# Patient Record
Sex: Male | Born: 1940 | Race: White | Hispanic: No | Smoking: Never smoker
Health system: Southern US, Community
[De-identification: ages and names within clinical notes are randomized; demographics above are authoritative.]

## PROBLEM LIST (undated history)

## (undated) DIAGNOSIS — D72829 Elevated white blood cell count, unspecified: Secondary | ICD-10-CM

## (undated) DIAGNOSIS — I350 Nonrheumatic aortic (valve) stenosis: Secondary | ICD-10-CM

## (undated) DIAGNOSIS — G4700 Insomnia, unspecified: Secondary | ICD-10-CM

## (undated) DIAGNOSIS — Z951 Presence of aortocoronary bypass graft: Secondary | ICD-10-CM

## (undated) DIAGNOSIS — I5020 Unspecified systolic (congestive) heart failure: Secondary | ICD-10-CM

## (undated) DIAGNOSIS — Z952 Presence of prosthetic heart valve: Secondary | ICD-10-CM

## (undated) DIAGNOSIS — I712 Thoracic aortic aneurysm, without rupture, unspecified: Secondary | ICD-10-CM

## (undated) DIAGNOSIS — I779 Disorder of arteries and arterioles, unspecified: Secondary | ICD-10-CM

## (undated) DIAGNOSIS — I2510 Atherosclerotic heart disease of native coronary artery without angina pectoris: Secondary | ICD-10-CM

## (undated) DIAGNOSIS — C801 Malignant (primary) neoplasm, unspecified: Secondary | ICD-10-CM

## (undated) DIAGNOSIS — I255 Ischemic cardiomyopathy: Secondary | ICD-10-CM

## (undated) DIAGNOSIS — C4491 Basal cell carcinoma of skin, unspecified: Secondary | ICD-10-CM

## (undated) DIAGNOSIS — R55 Syncope and collapse: Secondary | ICD-10-CM

## (undated) DIAGNOSIS — I700 Atherosclerosis of aorta: Secondary | ICD-10-CM

## (undated) DIAGNOSIS — M719 Bursopathy, unspecified: Secondary | ICD-10-CM

## (undated) DIAGNOSIS — K635 Polyp of colon: Secondary | ICD-10-CM

## (undated) DIAGNOSIS — E291 Testicular hypofunction: Secondary | ICD-10-CM

## (undated) DIAGNOSIS — E041 Nontoxic single thyroid nodule: Secondary | ICD-10-CM

## (undated) DIAGNOSIS — R0602 Shortness of breath: Secondary | ICD-10-CM

## (undated) DIAGNOSIS — E785 Hyperlipidemia, unspecified: Secondary | ICD-10-CM

## (undated) DIAGNOSIS — J9621 Acute and chronic respiratory failure with hypoxia: Secondary | ICD-10-CM

## (undated) DIAGNOSIS — I10 Essential (primary) hypertension: Secondary | ICD-10-CM

## (undated) DIAGNOSIS — R011 Cardiac murmur, unspecified: Secondary | ICD-10-CM

## (undated) DIAGNOSIS — Z7982 Long term (current) use of aspirin: Secondary | ICD-10-CM

## (undated) DIAGNOSIS — I509 Heart failure, unspecified: Secondary | ICD-10-CM

## (undated) HISTORY — PX: SHOULDER INJECTION: SHX5048

## (undated) HISTORY — PX: THYROIDECTOMY, PARTIAL: SHX18

## (undated) HISTORY — PX: HERNIA REPAIR: SHX51

## (undated) HISTORY — PX: CARDIAC CATHETERIZATION: SHX172

## (undated) HISTORY — PX: OTHER SURGICAL HISTORY: SHX169

## (undated) HISTORY — PX: VASCULAR SURGERY: SHX849

## (undated) HISTORY — PX: BASAL CELL CARCINOMA EXCISION: SHX1214

## (undated) HISTORY — PX: CAROTID ENDARTERECTOMY: SUR193

---

## 1968-11-26 HISTORY — PX: INGUINAL HERNIA REPAIR: SUR1180

## 2007-11-11 ENCOUNTER — Ambulatory Visit: Payer: Self-pay | Admitting: Gastroenterology

## 2009-07-27 ENCOUNTER — Ambulatory Visit: Payer: Self-pay | Admitting: Family Medicine

## 2009-08-03 ENCOUNTER — Ambulatory Visit: Payer: Self-pay | Admitting: Vascular Surgery

## 2009-08-16 ENCOUNTER — Ambulatory Visit: Payer: Self-pay | Admitting: Vascular Surgery

## 2009-08-17 ENCOUNTER — Inpatient Hospital Stay: Payer: Self-pay | Admitting: Vascular Surgery

## 2009-11-26 HISTORY — PX: CAROTID ENDARTERECTOMY: SUR193

## 2010-04-20 ENCOUNTER — Ambulatory Visit: Payer: Self-pay | Admitting: Unknown Physician Specialty

## 2010-08-29 ENCOUNTER — Ambulatory Visit: Payer: Self-pay | Admitting: Unknown Physician Specialty

## 2010-09-19 ENCOUNTER — Ambulatory Visit: Payer: Self-pay | Admitting: Unknown Physician Specialty

## 2011-11-22 ENCOUNTER — Ambulatory Visit: Payer: Self-pay | Admitting: Unknown Physician Specialty

## 2012-04-28 ENCOUNTER — Ambulatory Visit: Payer: Self-pay | Admitting: Unknown Physician Specialty

## 2013-05-13 ENCOUNTER — Ambulatory Visit: Payer: Self-pay | Admitting: Unknown Physician Specialty

## 2013-07-06 ENCOUNTER — Ambulatory Visit: Payer: Self-pay | Admitting: Unknown Physician Specialty

## 2013-07-07 LAB — PATHOLOGY REPORT

## 2013-09-14 ENCOUNTER — Ambulatory Visit: Payer: Self-pay | Admitting: Unknown Physician Specialty

## 2014-03-16 ENCOUNTER — Ambulatory Visit: Payer: Self-pay | Admitting: Unknown Physician Specialty

## 2014-09-29 ENCOUNTER — Emergency Department: Payer: Self-pay | Admitting: Emergency Medicine

## 2014-09-29 LAB — BASIC METABOLIC PANEL
Anion Gap: 8 (ref 7–16)
BUN: 15 mg/dL (ref 7–18)
CALCIUM: 8.6 mg/dL (ref 8.5–10.1)
CHLORIDE: 107 mmol/L (ref 98–107)
CREATININE: 1.23 mg/dL (ref 0.60–1.30)
Co2: 29 mmol/L (ref 21–32)
EGFR (African American): >60
EGFR (Non-African Amer.): >60
GLUCOSE: 94 mg/dL (ref 65–99)
OSMOLALITY: 287 (ref 275–301)
Potassium: 3.8 mmol/L (ref 3.5–5.1)
Sodium: 144 mmol/L (ref 136–145)

## 2014-09-29 LAB — CBC
HCT: 45.7 % (ref 40.0–52.0)
HGB: 15.5 g/dL (ref 13.0–18.0)
MCH: 30.5 pg (ref 26.0–34.0)
MCHC: 33.9 g/dL (ref 32.0–36.0)
MCV: 90 fL (ref 80–100)
PLATELETS: 232 10*3/uL (ref 150–440)
RBC: 5.09 10*6/uL (ref 4.40–5.90)
RDW: 13.2 % (ref 11.5–14.5)
WBC: 9.9 10*3/uL (ref 3.8–10.6)

## 2014-09-29 LAB — HEPATIC FUNCTION PANEL A (ARMC)
ALT: 33 U/L
AST: 17 U/L (ref 15–37)
Albumin: 3.9 g/dL (ref 3.4–5.0)
Alkaline Phosphatase: 71 U/L
BILIRUBIN TOTAL: 0.4 mg/dL (ref 0.2–1.0)
Bilirubin, Direct: <0.1 mg/dL (ref 0.0–0.2)
TOTAL PROTEIN: 7.7 g/dL (ref 6.4–8.2)

## 2014-09-29 LAB — TROPONIN I

## 2014-12-15 ENCOUNTER — Ambulatory Visit: Payer: Self-pay | Admitting: Unknown Physician Specialty

## 2015-01-19 ENCOUNTER — Ambulatory Visit: Payer: Self-pay | Admitting: Ophthalmology

## 2015-01-25 ENCOUNTER — Ambulatory Visit: Payer: Self-pay | Admitting: Ophthalmology

## 2015-02-15 ENCOUNTER — Ambulatory Visit: Payer: Self-pay | Admitting: Ophthalmology

## 2015-03-11 ENCOUNTER — Other Ambulatory Visit: Payer: Self-pay | Admitting: Unknown Physician Specialty

## 2015-03-11 DIAGNOSIS — E041 Nontoxic single thyroid nodule: Secondary | ICD-10-CM

## 2015-03-27 NOTE — Op Note (Signed)
PATIENT NAME:  Andre Murphy, Andre Murphy MR#:  [id number] DATE OF BIRTH:  May 02, 1941  DATE OF PROCEDURE:  02/15/2015  PREOPERATIVE DIAGNOSIS:  Nuclear sclerotic cataract of the left eye.   POSTOPERATIVE DIAGNOSIS:  Nuclear sclerotic cataract of the left eye.   OPERATIVE PROCEDURE:  Cataract extraction by phacoemulsification with implant of intraocular lens to left eye.   SURGEON:  Livingston Diones. Natalina Wieting, MD  ANESTHESIA:  1. Managed anesthesia care.  2. Topical tetracaine drops followed by 2% Xylocaine jelly applied in the preoperative holding area.   COMPLICATIONS:  None.   TECHNIQUE:  Stop and chop.  DESCRIPTION OF PROCEDURE:  The patient was examined and consented in the preoperative holding area where the aforementioned topical anesthesia was applied to the left eye and then brought back to the operating room, where the left eye was prepped and draped in the usual sterile ophthalmic fashion and a lid speculum was placed. A paracentesis was created with the side port blade and the anterior chamber was filled with viscoelastic. A near clear corneal incision was performed with the steel keratome. A continuous curvilinear capsulorrhexis was performed with a cystotome followed by the capsulorrhexis forceps. Hydrodissection and hydrodelineation were carried out with BSS on a blunt cannula. The lens was removed in a stop and chop technique and the remaining cortical material was removed with the irrigation-aspiration handpiece. The capsular bag was inflated with viscoelastic and the Tecnis ZCB00, 19.0-diopter lens, serial number [id number], was placed in the capsular bag without complication. The remaining viscoelastic was removed from the eye with the irrigation-aspiration handpiece. The wounds were hydrated. The anterior chamber was flushed with Miostat and the eye was inflated to physiologic pressure. 0.1 mL of cefuroxime concentration 10 mg/mL was placed in the anterior chamber. The wounds were found to be  water tight. The eye was dressed with Vigamox. The patient was given protective glasses to wear throughout the day and a shield with which to sleep tonight. The patient was also given drops with which to begin a drop regimen today and will follow up with me in one day.    ____________________________ Livingston Diones. Nawal Burling, MD wlp:nb D: 02/15/2015 21:22:51 ET T: 02/16/2015 07:16:47 ET JOB#: [id number]  cc: Analyse Angst L. Aneesah Hernan, MD, <Dictator> Livingston Diones Raejean Swinford MD ELECTRONICALLY SIGNED 02/16/2015 12:04

## 2015-03-27 NOTE — Op Note (Signed)
PATIENT NAME:  Andre Murphy, Andre Murphy MR#:  [id number] DATE OF BIRTH:  1941/05/10  DATE OF PROCEDURE:  01/25/2015  PREOPERATIVE DIAGNOSIS:  Nuclear sclerotic cataract of the right eye.   POSTOPERATIVE DIAGNOSIS:  Nuclear sclerotic cataract of the right eye.   OPERATIVE PROCEDURE:  Cataract extraction by phacoemulsification with implant of intraocular lens to right eye.   SURGEON:  Birder Robson, MD.   ANESTHESIA:  1. Managed anesthesia care.  2. Topical tetracaine drops followed by 2% Xylocaine jelly applied in the preoperative holding area.   COMPLICATIONS:  None.   TECHNIQUE:   Stop and chop.   DESCRIPTION OF PROCEDURE:  The patient was examined and consented in the preoperative holding area where the aforementioned topical anesthesia was applied to the right eye and then brought back to the Operating Room where the right eye was prepped and draped in the usual sterile ophthalmic fashion and a lid speculum was placed. A paracentesis was created with the side port blade and the anterior chamber was filled with viscoelastic. A near clear corneal incision was performed with the steel keratome. A continuous curvilinear capsulorrhexis was performed with a cystotome followed by the capsulorrhexis forceps. Hydrodissection and hydrodelineation were carried out with BSS on a blunt cannula. The lens was removed in a stop and chop technique and the remaining cortical material was removed with the irrigation-aspiration handpiece. The capsular bag was inflated with viscoelastic and the Tecnis 0.17.5-diopter lens, serial number [id number] was placed in the capsular bag without complication. The remaining viscoelastic was removed from the eye with the irrigation-aspiration handpiece. The wounds were hydrated. The anterior chamber was flushed with Miostat and the eye was inflated to physiologic pressure. 0.1 mL of cefuroxime concentration 10 mg/mL was placed in the anterior chamber. The wounds were found to be water  tight. The eye was dressed with Vigamox. The patient was given protective glasses to wear throughout the day and a shield with which to sleep tonight. The patient was also given drops with which to begin a drop regimen today and will follow-up with me in one day.    ____________________________ Livingston Diones. Talisha Erby, MD wlp:mc D: 01/25/2015 21:14:16 ET T: 01/26/2015 08:53:38 ET JOB#: [id number]  cc: Starlyn Droge L. Aziza Stuckert, MD, <Dictator> Livingston Diones Marijayne Rauth MD ELECTRONICALLY SIGNED 01/26/2015 11:26

## 2015-06-07 ENCOUNTER — Ambulatory Visit
Admission: RE | Admit: 2015-06-07 | Discharge: 2015-06-07 | Disposition: A | Discharge status: Home / Self Care | Payer: Medicare Other | Source: Ambulatory Visit | Attending: Unknown Physician Specialty | Admitting: Unknown Physician Specialty

## 2015-06-07 DIAGNOSIS — E041 Nontoxic single thyroid nodule: Secondary | ICD-10-CM | POA: Insufficient documentation

## 2015-06-08 ENCOUNTER — Other Ambulatory Visit: Payer: Self-pay | Admitting: Unknown Physician Specialty

## 2015-06-08 DIAGNOSIS — D440 Neoplasm of uncertain behavior of thyroid gland: Secondary | ICD-10-CM

## 2015-12-09 ENCOUNTER — Ambulatory Visit: Payer: Medicare Other

## 2015-12-16 ENCOUNTER — Ambulatory Visit: Payer: Medicare Other

## 2016-08-17 ENCOUNTER — Encounter: Payer: Self-pay | Admitting: *Deleted

## 2016-08-20 ENCOUNTER — Encounter
Admission: RE | Disposition: A | Discharge status: Home / Self Care | Payer: Self-pay | Source: Ambulatory Visit | Attending: Unknown Physician Specialty

## 2016-08-20 ENCOUNTER — Ambulatory Visit
Admission: RE | Admit: 2016-08-20 | Discharge: 2016-08-20 | Disposition: A | Discharge status: Home / Self Care | Payer: Medicare Other | Source: Ambulatory Visit | Attending: Unknown Physician Specialty | Admitting: Unknown Physician Specialty

## 2016-08-20 ENCOUNTER — Ambulatory Visit: Payer: Medicare Other | Admitting: Anesthesiology

## 2016-08-20 ENCOUNTER — Encounter: Payer: Self-pay | Admitting: *Deleted

## 2016-08-20 DIAGNOSIS — Z79899 Other long term (current) drug therapy: Secondary | ICD-10-CM | POA: Diagnosis not present

## 2016-08-20 DIAGNOSIS — K621 Rectal polyp: Secondary | ICD-10-CM | POA: Diagnosis not present

## 2016-08-20 DIAGNOSIS — K635 Polyp of colon: Secondary | ICD-10-CM | POA: Diagnosis not present

## 2016-08-20 DIAGNOSIS — K5730 Diverticulosis of large intestine without perforation or abscess without bleeding: Secondary | ICD-10-CM | POA: Diagnosis not present

## 2016-08-20 DIAGNOSIS — Z86010 Personal history of colonic polyps: Secondary | ICD-10-CM | POA: Insufficient documentation

## 2016-08-20 DIAGNOSIS — D123 Benign neoplasm of transverse colon: Secondary | ICD-10-CM | POA: Diagnosis not present

## 2016-08-20 DIAGNOSIS — I10 Essential (primary) hypertension: Secondary | ICD-10-CM | POA: Diagnosis not present

## 2016-08-20 DIAGNOSIS — Z7982 Long term (current) use of aspirin: Secondary | ICD-10-CM | POA: Diagnosis not present

## 2016-08-20 DIAGNOSIS — Z1211 Encounter for screening for malignant neoplasm of colon: Secondary | ICD-10-CM | POA: Diagnosis present

## 2016-08-20 DIAGNOSIS — D120 Benign neoplasm of cecum: Secondary | ICD-10-CM | POA: Insufficient documentation

## 2016-08-20 DIAGNOSIS — K640 First degree hemorrhoids: Secondary | ICD-10-CM | POA: Insufficient documentation

## 2016-08-20 HISTORY — PX: COLONOSCOPY WITH PROPOFOL: SHX5780

## 2016-08-20 HISTORY — DX: Bursopathy, unspecified: M71.9

## 2016-08-20 HISTORY — DX: Hyperlipidemia, unspecified: E78.5

## 2016-08-20 HISTORY — DX: Essential (primary) hypertension: I10

## 2016-08-20 SURGERY — COLONOSCOPY WITH PROPOFOL
Anesthesia: General

## 2016-08-20 MED ORDER — MIDAZOLAM HCL 5 MG/5ML IJ SOLN
INTRAMUSCULAR | Status: DC | PRN
  Administered 2016-08-20: 1 mg via INTRAVENOUS

## 2016-08-20 MED ORDER — PROPOFOL 500 MG/50ML IV EMUL
INTRAVENOUS | Status: DC | PRN
  Administered 2016-08-20: 75 ug/kg/min via INTRAVENOUS

## 2016-08-20 MED ORDER — PROPOFOL 10 MG/ML IV BOLUS
INTRAVENOUS | Status: DC | PRN
  Administered 2016-08-20: 20 mg via INTRAVENOUS
  Administered 2016-08-20: 30 mg via INTRAVENOUS

## 2016-08-20 MED ORDER — PHENYLEPHRINE HCL 10 MG/ML IJ SOLN
INTRAMUSCULAR | Status: DC | PRN
  Administered 2016-08-20 (×6): 100 ug via INTRAVENOUS

## 2016-08-20 MED ORDER — LIDOCAINE HCL (PF) 2 % IJ SOLN
INTRAMUSCULAR | Status: DC | PRN
  Administered 2016-08-20: 50 mg

## 2016-08-20 MED ORDER — SODIUM CHLORIDE 0.9 % IV SOLN: INTRAVENOUS | Status: DC

## 2016-08-20 MED ORDER — FENTANYL CITRATE (PF) 100 MCG/2ML IJ SOLN
INTRAMUSCULAR | Status: DC | PRN
  Administered 2016-08-20 (×2): 25 ug via INTRAVENOUS

## 2016-08-20 MED ORDER — SODIUM CHLORIDE 0.9 % IV SOLN
INTRAVENOUS | Status: DC
  Administered 2016-08-20: 09:00:00 via INTRAVENOUS

## 2016-08-20 NOTE — Op Note (Signed)
[facility] Gastroenter[facility] Patient Name: Andre Murphy Procedure Date: 08/20/2016 9:34 AM MRN: [id number] Account #: [id number] Date of Birth: Jul 16, 1941 Admit Type: Outpatient Age: 75 Room: [facility] ENDO ROOM 4 Gender: Male Note Status: Finalized Procedure:            Colonoscopy Indications:          High risk colon cancer surveillance: Personal history                        of colonic polyps Providers:            Manya Silvas, MD Referring MD:         Irven Easterly. Kary Kos, MD (Referring MD) Medicines:            Propofol per Anesthesia Complications:        No immediate complications. Procedure:            Pre-Anesthesia Assessment:                       - After reviewing the risks and benefits, the patient                        was deemed in satisfactory condition to undergo the                        procedure.                       After obtaining informed consent, the colonoscope was                        passed under direct vision. Throughout the procedure,                        the patient's blood pressure, pulse, and oxygen                        saturations were monitored continuously. The                        Colonoscope was introduced through the anus and                        advanced to the the cecum, identified by appendiceal                        orifice and ileocecal valve. The colonoscopy was                        performed without difficulty. The patient tolerated the                        procedure well. The quality of the bowel preparation                        was excellent. Findings:      A diminutive polyp was found in the cecum. The polyp was sessile. The       polyp was removed with a jumbo cold forceps. Resection and retrieval       were complete.      Two sessile polyps were found  in the hepatic flexure. The polyps were       diminutive in size. These polyps were removed with a jumbo cold forceps.       Resection and  retrieval were complete.      A diminutive polyp was found in the sigmoid colon. The polyp was       sessile. The polyp was removed with a jumbo cold forceps. Resection and       retrieval were complete.      A diminutive polyp was found in the rectum. The polyp was sessile. The       polyp was removed with a jumbo cold forceps. Resection and retrieval       were complete.      Multiple medium-mouthed diverticula were found in the sigmoid colon and       descending colon.      Internal hemorrhoids were found during endoscopy. The hemorrhoids were       small and Grade I (internal hemorrhoids that do not prolapse).      The exam was otherwise without abnormality. Impression:           - One diminutive polyp in the cecum, removed with a                        jumbo cold forceps. Resected and retrieved.                       - Two diminutive polyps at the hepatic flexure, removed                        with a jumbo cold forceps. Resected and retrieved.                       - One diminutive polyp in the sigmoid colon, removed                        with a jumbo cold forceps. Resected and retrieved.                       - One diminutive polyp in the rectum, removed with a                        jumbo cold forceps. Resected and retrieved.                       - Diverticulosis in the sigmoid colon and in the                        descending colon.                       - Internal hemorrhoids.                       - The examination was otherwise normal. Recommendation:       - Await pathology results. Manya Silvas, MD 08/20/2016 10:06:51 AM This report has been signed electronically. Number of Addenda: 0 Note Initiated On: 08/20/2016 9:34 AM Total Procedure Duration: 0 hours 19 minutes 55 seconds       [facility]

## 2016-08-20 NOTE — Anesthesia Postprocedure Evaluation (Signed)
Anesthesia Post Note  Patient: Andre Murphy  Procedure(s) Performed: Procedure(s) (LRB): COLONOSCOPY WITH PROPOFOL (N/A)  Patient location during evaluation: Endoscopy Anesthesia Type: General Level of consciousness: awake and alert and oriented Pain management: pain level controlled Vital Signs Assessment: post-procedure vital signs reviewed and stable Respiratory status: spontaneous breathing, nonlabored ventilation and respiratory function stable Cardiovascular status: blood pressure returned to baseline and stable Postop Assessment: no signs of nausea or vomiting Anesthetic complications: no    Last Vitals:  Vitals:   08/20/16 1006 08/20/16 1016  BP: (!) 121/57 (!) 119/56  Pulse: 60 64  Resp: 13 12  Temp: (!) 36.1 C     Last Pain:  Vitals:   08/20/16 1006  TempSrc: Tympanic                 Lashay Osborne

## 2016-08-20 NOTE — Anesthesia Preprocedure Evaluation (Signed)
Anesthesia Evaluation  Patient identified by MRN, date of birth, ID band Patient awake    Reviewed: Allergy & Precautions, NPO status , Patient's Chart, lab work & pertinent test results  Airway Mallampati: II  TM Distance: >3 FB Neck ROM: Full    Dental no notable dental hx.    Pulmonary neg pulmonary ROS, neg sleep apnea, neg COPD,    breath sounds clear to auscultation- rhonchi (-) wheezing      Cardiovascular hypertension, Pt. on medications (-) CAD and (-) Past MI  Rhythm:Regular Rate:Normal - Systolic murmurs and - Diastolic murmurs    Neuro/Psych negative neurological ROS  negative psych ROS   GI/Hepatic negative GI ROS, Neg liver ROS,   Endo/Other  negative endocrine ROSneg diabetes  Renal/GU negative Renal ROS     Musculoskeletal   Abdominal (+) - obese,   Peds  Hematology negative hematology ROS (+)   Anesthesia Other Findings   Reproductive/Obstetrics                             Anesthesia Physical Anesthesia Plan  ASA: II  Anesthesia Plan: General   Post-op Pain Management:    Induction: Intravenous  Airway Management Planned: Natural Airway  Additional Equipment:   Intra-op Plan:   Post-operative Plan:   Informed Consent: I have reviewed the patients History and Physical, chart, labs and discussed the procedure including the risks, benefits and alternatives for the proposed anesthesia with the patient or authorized representative who has indicated his/her understanding and acceptance.   Dental advisory given  Plan Discussed with: CRNA and Anesthesiologist  Anesthesia Plan Comments:         Anesthesia Quick Evaluation

## 2016-08-20 NOTE — H&P (Signed)
   Primary Care Physician:  Maryland Pink, MD Primary Gastroenterologist:  Dr. Vira Agar  Pre-Procedure History & Physical: HPI:  Andre Murphy is a 75 y.o. male is here for an colonoscopy for personal history of colon polyps   Past Medical History:  Diagnosis Date  . Bursitis   . Elevated lipids   . Hypertension     Past Surgical History:  Procedure Laterality Date  . CAROTID ENDARTERECTOMY Right   . HERNIA REPAIR     1970  (Bilateral Inguinal Hernia)  . SHOULDER INJECTION    . THYROIDECTOMY, PARTIAL    . VASCULAR SURGERY     RT Carotid Endarterectomy    Prior to Admission medications   Medication Sig Start Date End Date Taking? Authorizing Provider  amLODipine (NORVASC) 5 MG tablet Take 5 mg by mouth daily.   Yes Historical Provider, MD  aspirin EC 81 MG tablet Take 81 mg by mouth daily.   Yes Historical Provider, MD    Allergies as of 07/05/2016  . (Not on File)    History reviewed. No pertinent family history.  Social History   Social History  . Marital status: Married    Spouse name: N/A  . Number of children: N/A  . Years of education: N/A   Occupational History  . Not on file.   Social History Main Topics  . Smoking status: Never Smoker  . Smokeless tobacco: Never Used  . Alcohol use No  . Drug use: No  . Sexual activity: Not on file   Other Topics Concern  . Not on file   Social History Narrative  . No narrative on file    Review of Systems: See HPI, otherwise negative ROS  Physical Exam: BP (!) 141/74   Pulse 76   Temp 97.6 F (36.4 C) (Tympanic)   Resp 16   Ht 5' 9.5" (1.765 m)   Wt 76.7 kg (169 lb)   SpO2 99%   BMI 24.60 kg/m  General:   Alert,  pleasant and cooperative in NAD Head:  Normocephalic and atraumatic. Neck:  Supple; no masses or thyromegaly. Lungs:  Clear throughout to auscultation.    Heart:  Regular rate and rhythm. Abdomen:  Soft, nontender and nondistended. Normal bowel sounds, without guarding, and without  rebound.   Neurologic:  Alert and  oriented x4;  grossly normal neurologically.  Impression/Plan: KHAI SIGLER is here for an colonoscopy to be performed for [facility] colon polyps  Risks, benefits, limitations, and alternatives regarding  colonoscopy have been reviewed with the patient.  Questions have been answered.  All parties agreeable.   Gaylyn Cheers, MD  08/20/2016, 9:31 AM

## 2016-08-20 NOTE — Transfer of Care (Signed)
Immediate Anesthesia Transfer of Care Note  Patient: Andre Murphy  Procedure(s) Performed: Procedure(s): COLONOSCOPY WITH PROPOFOL (N/A)  Patient Location: PACU  Anesthesia Type:General  Level of Consciousness: sedated  Airway & Oxygen Therapy: Patient Spontanous Breathing and Patient connected to nasal cannula oxygen  Post-op Assessment: Report given to RN and Post -op Vital signs reviewed and stable  Post vital signs: Reviewed and stable  Last Vitals:  Vitals:   08/20/16 0843 08/20/16 1006  BP: (!) 141/74 (!) 121/57  Pulse: 76 60  Resp: 16 13  Temp: 36.4 C (!) 36.1 C    Last Pain:  Vitals:   08/20/16 1006  TempSrc: Tympanic         Complications: No apparent anesthesia complications

## 2016-08-21 ENCOUNTER — Encounter: Payer: Self-pay | Admitting: Unknown Physician Specialty

## 2016-08-21 LAB — SURGICAL PATHOLOGY

## 2016-12-28 ENCOUNTER — Ambulatory Visit (INDEPENDENT_AMBULATORY_CARE_PROVIDER_SITE_OTHER): Payer: Medicare Other

## 2016-12-28 ENCOUNTER — Ambulatory Visit (INDEPENDENT_AMBULATORY_CARE_PROVIDER_SITE_OTHER): Payer: Medicare Other | Admitting: Vascular Surgery

## 2016-12-28 ENCOUNTER — Encounter (INDEPENDENT_AMBULATORY_CARE_PROVIDER_SITE_OTHER): Payer: Self-pay | Admitting: Vascular Surgery

## 2016-12-28 ENCOUNTER — Other Ambulatory Visit (INDEPENDENT_AMBULATORY_CARE_PROVIDER_SITE_OTHER): Payer: Self-pay | Admitting: Vascular Surgery

## 2016-12-28 VITALS — BP 184/97 | HR 71 | Resp 16 | Wt 180.8 lb

## 2016-12-28 DIAGNOSIS — I10 Essential (primary) hypertension: Secondary | ICD-10-CM | POA: Diagnosis not present

## 2016-12-28 DIAGNOSIS — I779 Disorder of arteries and arterioles, unspecified: Secondary | ICD-10-CM

## 2016-12-28 DIAGNOSIS — I6529 Occlusion and stenosis of unspecified carotid artery: Secondary | ICD-10-CM | POA: Insufficient documentation

## 2016-12-28 DIAGNOSIS — I739 Peripheral vascular disease, unspecified: Principal | ICD-10-CM

## 2016-12-28 DIAGNOSIS — I6523 Occlusion and stenosis of bilateral carotid arteries: Secondary | ICD-10-CM

## 2016-12-28 LAB — VAS US CAROTID
LCCADDIAS: 28 cm/s
LCCADSYS: 115 cm/s
LCCAPDIAS: 19 cm/s
LCCAPSYS: 104 cm/s
LEFT ECA DIAS: -10 cm/s
LEFT VERTEBRAL DIAS: 23 cm/s
LICADDIAS: -16 cm/s
LICADSYS: -116 cm/s
LICAPDIAS: -40 cm/s
LICAPSYS: -151 cm/s
RCCAPSYS: -101 cm/s
RIGHT CCA MID DIAS: 17 cm/s
RIGHT ECA DIAS: -8 cm/s
RIGHT VERTEBRAL DIAS: 13 cm/s
Right CCA prox dias: -9 cm/s
Right cca dist sys: -65 cm/s

## 2016-12-28 NOTE — Assessment & Plan Note (Signed)
His carotid duplex today reveals a widely patent right carotid endarterectomy in stable 40-59% stenosis in the left carotid artery. We will continue to follow this on 6 month intervals with duplex. He will continue aspirin therapy daily.

## 2016-12-28 NOTE — Progress Notes (Signed)
MRN : [id number]  Andre Murphy is a 76 y.o. (Nov 14, 1941) male who presents with chief complaint of  Chief Complaint  Patient presents with  . Follow-up  .  History of Present Illness: Patient returns today in follow up of His carotid disease. He is doing well with no focal neurologic symptoms since his last visit. Specifically, the patient denies amaurosis fugax, speech or swallowing difficulties, or arm or leg weakness or numbness.  His carotid duplex today reveals a widely patent right carotid endarterectomy in stable 40-59% stenosis in the left carotid artery.  Current Outpatient Prescriptions  Medication Sig Dispense Refill  . amLODipine (NORVASC) 5 MG tablet Take 5 mg by mouth daily.    [name] aspirin EC 81 MG tablet Take 81 mg by mouth daily.     No current facility-administered medications for this visit.     Past Medical History:  Diagnosis Date  . Bursitis   . Elevated lipids   . Hypertension     Past Surgical History:  Procedure Laterality Date  . CAROTID ENDARTERECTOMY Right   . COLONOSCOPY WITH PROPOFOL N/A 08/20/2016   Procedure: COLONOSCOPY WITH PROPOFOL;  Surgeon: Manya Silvas, MD;  Location: [facility] ENDOSCOPY;  Service: Endoscopy;  Laterality: N/A;  . HERNIA REPAIR     1970  (Bilateral Inguinal Hernia)  . SHOULDER INJECTION    . THYROIDECTOMY, PARTIAL    . VASCULAR SURGERY     RT Carotid Endarterectomy    Social History Social History  Substance Use Topics  . Smoking status: Never Smoker  . Smokeless tobacco: Never Used  . Alcohol use No     Family History No bleeding or clotting disorders  Allergies  Allergen Reactions  . Crestor [Rosuvastatin Calcium] Other (See Comments)    Muscle pain     REVIEW OF SYSTEMS (Negative unless checked)  Constitutional: [] Weight loss  [] Fever  [] Chills Cardiac: [] Chest pain   [] Chest pressure   [] Palpitations   [] Shortness of breath when laying flat   [] Shortness of breath at rest   [] Shortness of breath  with exertion. Vascular:  [] Pain in legs with walking   [] Pain in legs at rest   [] Pain in legs when laying flat   [] Claudication   [] Pain in feet when walking  [] Pain in feet at rest  [] Pain in feet when laying flat   [] History of DVT   [] Phlebitis   [] Swelling in legs   [] Varicose veins   [] Non-healing ulcers Pulmonary:   [] Uses home oxygen   [] Productive cough   [] Hemoptysis   [] Wheeze  [] COPD   [] Asthma Neurologic:  [] Dizziness  [] Blackouts   [] Seizures   [] History of stroke   [] History of TIA  [] Aphasia   [] Temporary blindness   [] Dysphagia   [] Weakness or numbness in arms   [] Weakness or numbness in legs Musculoskeletal:  [x] Arthritis   [] Joint swelling   [] Joint pain   [] Low back pain Hematologic:  [] Easy bruising  [] Easy bleeding   [] Hypercoagulable state   [] Anemic   Gastrointestinal:  [] Blood in stool   [] Vomiting blood  [] Gastroesophageal reflux/heartburn   [] Abdominal pain Genitourinary:  [] Chronic kidney disease   [] Difficult urination  [] Frequent urination  [] Burning with urination   [] Hematuria Skin:  [] Rashes   [] Ulcers   [] Wounds Psychological:  [] History of anxiety   []  History of major depression.  Physical Examination  BP (!) 184/97 (BP Location: Left Arm)   Pulse 71   Resp 16   Wt 180 lb 12.8  oz (82 kg)   BMI 26.32 kg/m  Gen:  WD/WN, NAD Head: Becker/AT, No temporalis wasting. Ear/Nose/Throat: Hearing grossly intact, nares w/o erythema or drainage, trachea midline Eyes: Conjunctiva clear. Sclera non-icteric Neck: Supple.  No JVD.  Pulmonary:  Good air movement, no use of accessory muscles.  Cardiac: RRR, normal S1, S2 Vascular:  Vessel Right Left  Radial Palpable Palpable                                   Gastrointestinal: soft, non-tender/non-distended. No guarding/reflex.  Musculoskeletal: M/S 5/5 throughout.  No deformity or atrophy.  Neurologic: Sensation grossly intact in extremities.  Symmetrical.  Speech is fluent.  Psychiatric: Judgment intact, Mood &  affect appropriate for pt's clinical situation. Dermatologic: No rashes or ulcers noted.  No cellulitis or open wounds. Lymph : No Cervical, Axillary, or Inguinal lymphadenopathy.      Labs Recent Results (from the past 2160 hour(s))  VAS US CAROTID     Status: None (In process)   Collection Time: 12/28/16  3:31 PM  Result Value Ref Range   Right CCA prox sys -101 cm/s   Right CCA prox dias -9 cm/s   Right cca dist sys -65 cm/s   Left CCA prox sys 104 cm/s   Left CCA prox dias 19 cm/s   Left CCA dist sys 115 cm/s   Left CCA dist dias 28 cm/s   Left ICA prox sys -151 cm/s   Left ICA prox dias -40 cm/s   Left ICA dist sys -116 cm/s   Left ICA dist dias -16 cm/s   RIGHT CCA MID DIAS 17.00 cm/s   RIGHT ECA DIAS -8.00 cm/s   RIGHT VERTEBRAL DIAS 13.00 cm/s   LEFT ECA DIAS -10.00 cm/s   LEFT VERTEBRAL DIAS 23.00 cm/s    Radiology No results found.  Assessment/Plan  Essential hypertension, benign blood pressure control important in reducing the progression of atherosclerotic disease. On appropriate oral medications.   Carotid stenosis His carotid duplex today reveals a widely patent right carotid endarterectomy in stable 40-59% stenosis in the left carotid artery. We will continue to follow this on 6 month intervals with duplex. He will continue aspirin therapy daily.    Leotis Pain, MD  12/28/2016 5:28 PM    This note was created with Dragon medical transcription system.  Any errors from dictation are purely unintentional

## 2016-12-28 NOTE — Assessment & Plan Note (Signed)
blood pressure control important in reducing the progression of atherosclerotic disease. On appropriate oral medications.  

## 2017-01-15 ENCOUNTER — Ambulatory Visit (INDEPENDENT_AMBULATORY_CARE_PROVIDER_SITE_OTHER): Payer: Medicare Other | Admitting: Urology

## 2017-01-15 ENCOUNTER — Encounter: Payer: Self-pay | Admitting: Urology

## 2017-01-15 VITALS — BP 156/80 | HR 79 | Ht 70.0 in | Wt 181.7 lb

## 2017-01-15 DIAGNOSIS — N400 Enlarged prostate without lower urinary tract symptoms: Secondary | ICD-10-CM | POA: Diagnosis not present

## 2017-01-15 DIAGNOSIS — N5201 Erectile dysfunction due to arterial insufficiency: Secondary | ICD-10-CM | POA: Diagnosis not present

## 2017-01-15 DIAGNOSIS — I6523 Occlusion and stenosis of bilateral carotid arteries: Secondary | ICD-10-CM | POA: Diagnosis not present

## 2017-01-15 NOTE — Progress Notes (Signed)
01/15/2017 11:00 AM   Andre Murphy [birth year] [id number]  Referring provider: Maryland Pink, MD [address] [facility] [place], [name] [address]  Chief Complaint  Patient presents with  . New Patient (Initial Visit)    BPH, ED    HPI: I was consulted to assess the patient's voiding dysfunction and erectile dysfunction. He voids every 2-3 hours with good flow and sometimes stops and starts. He does feel empty. He gets up 2 or 3 times at night to urinate. He does not have ankle edema  He cannot obtain an erection firm enough to penetrate. He tried cialis without benefit.  History of kidney stones previous GU surgery and urinary tract infections. He has no neurologic issues  Modifying factors: There are no other modifying factors  Associated signs and symptoms: There are no other associated signs and symptoms Aggravating and relieving factors: There are no other aggravating or relieving factors Severity: Moderate Duration: Persistent     PMH: Past Medical History:  Diagnosis Date  . Bursitis   . Elevated lipids   . Hypertension     Surgical History: Past Surgical History:  Procedure Laterality Date  . CAROTID ENDARTERECTOMY Right   . COLONOSCOPY WITH PROPOFOL N/A 08/20/2016   Procedure: COLONOSCOPY WITH PROPOFOL;  Surgeon: Manya Silvas, MD;  Location: [facility] ENDOSCOPY;  Service: Endoscopy;  Laterality: N/A;  . HERNIA REPAIR     1970  (Bilateral Inguinal Hernia)  . SHOULDER INJECTION    . THYROIDECTOMY, PARTIAL    . VASCULAR SURGERY     RT Carotid Endarterectomy    Home Medications:  Allergies as of 01/15/2017      Reactions   Crestor [rosuvastatin Calcium] Other (See Comments)   Muscle pain   Rosuvastatin    Other reaction(s): Muscle Pain      Medication List       Accurate as of 01/15/17 11:00 AM. Always use your most recent med list.          amLODipine 5 MG tablet Commonly known as:  NORVASC Take 5 mg by mouth daily.   aspirin EC  81 MG tablet Take 81 mg by mouth daily.       Allergies:  Allergies  Allergen Reactions  . Crestor [Rosuvastatin Calcium] Other (See Comments)    Muscle pain  . Rosuvastatin     Other reaction(s): Muscle Pain    Family History: No family history on file.  Social History:  reports that he has never smoked. He has never used smokeless tobacco. He reports that he does not drink alcohol or use drugs.  ROS: UROLOGY Frequent Urination?: Yes Hard to postpone urination?: No Burning/pain with urination?: No Get up at night to urinate?: Yes Leakage of urine?: No Urine stream starts and stops?: Yes Trouble starting stream?: No Do you have to strain to urinate?: No Blood in urine?: No Urinary tract infection?: No Sexually transmitted disease?: No Injury to kidneys or bladder?: No Painful intercourse?: No Weak stream?: No Erection problems?: Yes Penile pain?: No  Gastrointestinal Nausea?: No Vomiting?: No Indigestion/heartburn?: No Diarrhea?: No Constipation?: No  Constitutional Fever: No Night sweats?: No Weight loss?: No Fatigue?: No  Skin Skin rash/lesions?: No Itching?: No  Eyes Blurred vision?: No Double vision?: No  Ears/Nose/Throat Sore throat?: No Sinus problems?: No  Hematologic/Lymphatic Swollen glands?: No Easy bruising?: No  Cardiovascular Leg swelling?: No Chest pain?: No  Respiratory Cough?: No Shortness of breath?: No  Endocrine Excessive thirst?: No  Musculoskeletal Back  pain?: No Joint pain?: No  Neurological Headaches?: No Dizziness?: No  Psychologic Depression?: No Anxiety?: No  Physical Exam: BP (!) 156/80   Pulse 79   Ht 5\' 10"  (1.778 m)   Wt 82.4 kg (181 lb 11.2 oz)   BMI 26.07 kg/m   Constitutional:  Alert and oriented, No acute distress. HEENT: [place] AT, moist mucus membranes.  Trachea midline, no masses. Cardiovascular: No clubbing, cyanosis, or edema. Respiratory: Normal respiratory effort, no increased  work of breathing. GI: Abdomen is soft, nontender, nondistended, no abdominal masses GU: No CVA tenderness. 60 g benign prostate Skin: No rashes, bruises or suspicious lesions. Lymph: No cervical or inguinal adenopathy. Neurologic: Grossly intact, no focal deficits, moving all 4 extremities. Psychiatric: Normal mood and affect.  Laboratory Data: Lab Results  Component Value Date   WBC 9.9 09/29/2014   HGB 15.5 09/29/2014   HCT 45.7 09/29/2014   MCV 90 09/29/2014   PLT 232 09/29/2014    Lab Results  Component Value Date   CREATININE 1.23 09/29/2014    No results found for: PSA  No results found for: TESTOSTERONE  No results found for: HGBA1C  Urinalysis No results found for: COLORURINE, APPEARANCEUR, LABSPEC, PHURINE, GLUCOSEU, HGBUR, BILIRUBINUR, KETONESUR, PROTEINUR, UROBILINOGEN, NITRITE, LEUKOCYTESUR  Pertinent Imaging: None  Assessment & Plan:  The patient has minimal voiding dysfunction and mild nocturia. Watchful waiting was discussed. The role of prostate medication was discussed. The pathophysiology and treatment of erectile dysfunction was discussed  The patient chose watchful waiting. He like to have a Viagra prescription I gave him 4 tablets with refills of 100 mg and discussed pros and cons and risks and contraindications. I walked him through the other treatment options and he is not interested.  He will otherwise follow-up when necessary    There are no diagnoses linked to this encounter.  No Follow-up on file.  Reece Packer, MD  [facility] [address] [place], [place] [address] [phone]

## 2017-07-02 ENCOUNTER — Ambulatory Visit (INDEPENDENT_AMBULATORY_CARE_PROVIDER_SITE_OTHER): Payer: Medicare Other | Admitting: Vascular Surgery

## 2017-07-02 ENCOUNTER — Ambulatory Visit (INDEPENDENT_AMBULATORY_CARE_PROVIDER_SITE_OTHER): Payer: Medicare Other

## 2017-07-02 ENCOUNTER — Encounter (INDEPENDENT_AMBULATORY_CARE_PROVIDER_SITE_OTHER): Payer: Self-pay | Admitting: Vascular Surgery

## 2017-07-02 VITALS — BP 148/85 | HR 71 | Resp 16 | Wt 179.0 lb

## 2017-07-02 DIAGNOSIS — I10 Essential (primary) hypertension: Secondary | ICD-10-CM

## 2017-07-02 DIAGNOSIS — I6523 Occlusion and stenosis of bilateral carotid arteries: Secondary | ICD-10-CM | POA: Diagnosis not present

## 2017-07-02 NOTE — Progress Notes (Signed)
MRN : [id number]  Andre Murphy is a 76 y.o. ([birth year]) male who presents with chief complaint of  Chief Complaint  Patient presents with  . Carotid    6 month follow up u/s  .  History of Present Illness: Patient returns in follow-up of his carotid disease. He is doing well and has no specific complaints today. He denies any focal neurologic symptoms. Specifically, the patient denies amaurosis fugax, speech or swallowing difficulties, or arm or leg weakness or numbness. He is in good spirits we had a nice discussion about the upcoming football season which I always enjoy. His carotid duplex today demonstrates a widely patent right carotid endarterectomy with stable 40-59% left carotid artery stenosis not significantly progressed from his previous study 6 months ago.   Current Outpatient Prescriptions  Medication Sig Dispense Refill  . amLODipine (NORVASC) 5 MG tablet Take 5 mg by mouth daily.    [name] aspirin EC 81 MG tablet Take 81 mg by mouth daily.    . tadalafil (CIALIS) 20 MG tablet TK 1 T PO QD PRN FOR ERECTILE DYSFUNCTION     No current facility-administered medications for this visit.     Past Medical History:  Diagnosis Date  . Bursitis   . Elevated lipids   . Hypertension     Past Surgical History:  Procedure Laterality Date  . CAROTID ENDARTERECTOMY Right   . COLONOSCOPY WITH PROPOFOL N/A 08/20/2016   Procedure: COLONOSCOPY WITH PROPOFOL;  Surgeon: Manya Silvas, MD;  Location: [facility] ENDOSCOPY;  Service: Endoscopy;  Laterality: N/A;  . HERNIA REPAIR     1970  (Bilateral Inguinal Hernia)  . SHOULDER INJECTION    . THYROIDECTOMY, PARTIAL    . VASCULAR SURGERY     RT Carotid Endarterectomy    Social History     Social History  Substance Use Topics  . Smoking status: Never Smoker  . Smokeless tobacco: Never Used  . Alcohol use No     Family History No bleeding or clotting disorders       Allergies  Allergen Reactions  . Crestor [Rosuvastatin  Calcium] Other (See Comments)    Muscle pain     REVIEW OF SYSTEMS (Negative unless checked)  Constitutional: [] Weight loss  [] Fever  [] Chills Cardiac: [] Chest pain   [] Chest pressure   [] Palpitations   [] Shortness of breath when laying flat   [] Shortness of breath at rest   [] Shortness of breath with exertion. Vascular:  [] Pain in legs with walking   [] Pain in legs at rest   [] Pain in legs when laying flat   [] Claudication   [] Pain in feet when walking  [] Pain in feet at rest  [] Pain in feet when laying flat   [] History of DVT   [] Phlebitis   [] Swelling in legs   [] Varicose veins   [] Non-healing ulcers Pulmonary:   [] Uses home oxygen   [] Productive cough   [] Hemoptysis   [] Wheeze  [] COPD   [] Asthma Neurologic:  [] Dizziness  [] Blackouts   [] Seizures   [] History of stroke   [] History of TIA  [] Aphasia   [] Temporary blindness   [] Dysphagia   [] Weakness or numbness in arms   [] Weakness or numbness in legs Musculoskeletal:  [x] Arthritis   [] Joint swelling   [] Joint pain   [] Low back pain Hematologic:  [] Easy bruising  [] Easy bleeding   [] Hypercoagulable state   [] Anemic   Gastrointestinal:  [] Blood in stool   [] Vomiting blood  [] Gastroesophageal reflux/heartburn   [] Abdominal pain Genitourinary:  []   Chronic kidney disease   [] Difficult urination  [] Frequent urination  [] Burning with urination   [] Hematuria Skin:  [] Rashes   [] Ulcers   [] Wounds Psychological:  [] History of anxiety   []  History of major depression    Physical Examination  Vitals:   07/02/17 1600  BP: (!) 148/85  Pulse: 71  Resp: 16  Weight: 179 lb (81.2 kg)   Body mass index is 25.68 kg/m. Gen:  WD/WN, NAD. Appears younger than stated age. Head: [place]/AT, No temporalis wasting. Ear/Nose/Throat: Hearing grossly intact, nares w/o erythema or drainage, trachea midline Eyes: Conjunctiva clear. Sclera non-icteric Neck: Supple.  No bruit or JVD.  Pulmonary:  Good air movement, no use of accessory muscles Cardiac: RRR,  normal S1, S2 Vascular:  Vessel Right Left  Radial Palpable Palpable                                    Musculoskeletal: M/S 5/5 throughout.  No deformity or atrophy. Neurologic: CN 2-12 intact. Sensation grossly intact in extremities.  Symmetrical.  Speech is fluent. Motor exam as listed above. Psychiatric: Judgment intact, Mood & affect appropriate for pt's clinical situation. Dermatologic: No rashes or ulcers noted.  No cellulitis or open wounds.     CBC Lab Results  Component Value Date   WBC 9.9 09/29/2014   HGB 15.5 09/29/2014   HCT 45.7 09/29/2014   MCV 90 09/29/2014   PLT 232 09/29/2014    BMET    Component Value Date/Time   NA 144 09/29/2014 1333   K 3.8 09/29/2014 1333   CL 107 09/29/2014 1333   CO2 29 09/29/2014 1333   GLUCOSE 94 09/29/2014 1333   BUN 15 09/29/2014 1333   CREATININE 1.23 09/29/2014 1333   CALCIUM 8.6 09/29/2014 1333   GFRNONAA >60 09/29/2014 1333   GFRAA >60 09/29/2014 1333   CrCl cannot be calculated (Patient's most recent lab result is older than the maximum 21 days allowed.).  COAG No results found for: INR, PROTIME  Radiology No results found.    Assessment/Plan Essential hypertension, benign blood pressure control important in reducing the progression of atherosclerotic disease. On appropriate oral medications.  Carotid stenosis His carotid duplex today demonstrates a widely patent right carotid endarterectomy with stable 40-59% left carotid artery stenosis not significantly progressed from his previous study 6 months ago. He will continue aspirin therapy. He has previously tried a statin with intolerance to them. I will plan to recheck him in 6 months with duplex.    Leotis Pain, MD  07/02/2017 5:50 PM    This note was created with Dragon medical transcription system.  Any errors from dictation are purely unintentional

## 2017-07-02 NOTE — Assessment & Plan Note (Signed)
His carotid duplex today demonstrates a widely patent right carotid endarterectomy with stable 40-59% left carotid artery stenosis not significantly progressed from his previous study 6 months ago. He will continue aspirin therapy. He has previously tried a statin with intolerance to them. I will plan to recheck him in 6 months with duplex.

## 2017-07-02 NOTE — Patient Instructions (Signed)
Carotid Artery Disease The carotid arteries are arteries on both sides of the neck. They carry blood to the brain. Carotid artery disease is when the arteries get smaller (narrow) or get blocked. If these arteries get smaller or get blocked, you are more likely to have a stroke or warning stroke (transient ischemic attack). Follow these instructions at home:  Take medicines as told by your doctor. Make sure you understand all your medicine instructions. Do not stop your medicines without talking to your doctor first.  Follow your doctor's diet instructions. It is important to eat a healthy diet that includes plenty of: ? Fresh fruits. ? Vegetables. ? Lean meats.  Avoid: ? High-fat foods. ? High-sodium foods. ? Foods that are fried, overly processed, or have poor nutritional value.  Stay a healthy weight.  Stay active. Get at least 30 minutes of activity every day.  Do not smoke.  Limit alcohol use to: ? No more than 2 drinks a day for men. ? No more than 1 drink a day for women who are not pregnant.  Do not use illegal drugs.  Keep all doctor visits as told. Get help right away if:  You have sudden weakness or loss of feeling (numbness) on one side of the body, such as the face, arm, or leg.  You have sudden confusion.  You have trouble speaking (aphasia) or understanding.  You have sudden trouble seeing out of one or both eyes.  You have sudden trouble walking.  You have dizziness or feel like you might pass out (faint).  You have a loss of balance or your movements are not steady (uncoordinated).  You have a sudden, severe headache with no known cause.  You have trouble swallowing (dysphagia). Call your local emergency services (911 in [place] Do notdrive yourself to the clinic or hospital. This information is not intended to replace advice given to you by your health care provider. Make sure you discuss any questions you have with your health care  provider. Document Released: 10/29/2012 Document Revised: 04/19/2016 Document Reviewed: 05/13/2013 Elsevier Interactive Patient Education  2018 Elsevier Inc.  

## 2018-01-07 ENCOUNTER — Encounter (INDEPENDENT_AMBULATORY_CARE_PROVIDER_SITE_OTHER): Payer: Self-pay | Admitting: Vascular Surgery

## 2018-01-07 ENCOUNTER — Ambulatory Visit (INDEPENDENT_AMBULATORY_CARE_PROVIDER_SITE_OTHER): Payer: Medicare Other | Admitting: Vascular Surgery

## 2018-01-07 ENCOUNTER — Ambulatory Visit (INDEPENDENT_AMBULATORY_CARE_PROVIDER_SITE_OTHER): Payer: Medicare Other

## 2018-01-07 VITALS — BP 167/89 | HR 73 | Resp 16 | Ht 70.0 in | Wt 182.0 lb

## 2018-01-07 DIAGNOSIS — I10 Essential (primary) hypertension: Secondary | ICD-10-CM | POA: Diagnosis not present

## 2018-01-07 DIAGNOSIS — I6523 Occlusion and stenosis of bilateral carotid arteries: Secondary | ICD-10-CM

## 2018-01-07 NOTE — Progress Notes (Signed)
MRN : [id number]  Andre Murphy is a 77 y.o. (09/19/1941) male who presents with chief complaint of  Chief Complaint  Patient presents with  . Follow-up    6 month Carotid f/u  .  History of Present Illness: Patient returns in follow-up of his carotid disease. He is doing well and has no specific complaints today. He denies any focal neurologic symptoms. Specifically, the patient denies amaurosis fugax, speech or swallowing difficulties, or arm or leg weakness or numbness. He is in good spirits we had a nice discussion about the past football season and current basketball season which I always enjoy. His carotid duplex today demonstrates a widely patent right carotid endarterectomy with stable 40-59% left carotid artery stenosis not significantly progressed from his previous study 6 months ago.  Current Outpatient Prescriptions  Medication Sig Dispense Refill  . amLODipine (NORVASC) 5 MG tablet Take 5 mg by mouth daily.    [name] aspirin EC 81 MG tablet Take 81 mg by mouth daily.    . tadalafil (CIALIS) 20 MG tablet TK 1 T PO QD PRN FOR ERECTILE DYSFUNCTION     No current facility-administered medications for this visit.         Past Medical History:  Diagnosis Date  . Bursitis   . Elevated lipids   . Hypertension          Past Surgical History:  Procedure Laterality Date  . CAROTID ENDARTERECTOMY Right   . COLONOSCOPY WITH PROPOFOL N/A 08/20/2016   Procedure: COLONOSCOPY WITH PROPOFOL;  Surgeon: Manya Silvas, MD;  Location: [facility] ENDOSCOPY;  Service: Endoscopy;  Laterality: N/A;  . HERNIA REPAIR     1970  (Bilateral Inguinal Hernia)  . SHOULDER INJECTION    . THYROIDECTOMY, PARTIAL    . VASCULAR SURGERY     RT Carotid Endarterectomy    Social History     Social History  Substance Use Topics  . Smoking status: Never Smoker  . Smokeless tobacco: Never Used  . Alcohol use No     Family History No bleeding or clotting  disorders       Allergies  Allergen Reactions  . Crestor [Rosuvastatin Calcium] Other (See Comments)    Muscle pain     REVIEW OF SYSTEMS(Negative unless checked)  Constitutional: [] Weight loss[] Fever[] Chills Cardiac:[] Chest pain[] Chest pressure[] Palpitations [] Shortness of breath when laying flat [] Shortness of breath at rest [] Shortness of breath with exertion. Vascular: [] Pain in legs with walking[] Pain in legsat rest[] Pain in legs when laying flat [] Claudication [] Pain in feet when walking [] Pain in feet at rest [] Pain in feet when laying flat [] History of DVT [] Phlebitis [] Swelling in legs [] Varicose veins [] Non-healing ulcers Pulmonary: [] Uses home oxygen [] Productive cough[] Hemoptysis [] Wheeze [] COPD [] Asthma Neurologic: [] Dizziness [] Blackouts [] Seizures [] History of stroke [] History of TIA[] Aphasia [] Temporary blindness[] Dysphagia [] Weaknessor numbness in arms [] Weakness or numbnessin legs Musculoskeletal: [x] Arthritis [] Joint swelling [] Joint pain [] Low back pain Hematologic:[] Easy bruising[] Easy bleeding [] Hypercoagulable state [] Anemic  Gastrointestinal:[] Blood in stool[] Vomiting blood[] Gastroesophageal reflux/heartburn[] Abdominal pain Genitourinary: [] Chronic kidney disease [] Difficulturination [] Frequenturination [] Burning with urination[] Hematuria Skin: [] Rashes [] Ulcers [] Wounds Psychological: [] History of anxiety[] History of major depression       Physical Examination  Vitals:   01/07/18 1550 01/07/18 1551  BP: (!) 155/93 (!) 167/89  Pulse: 73   Resp: 16   Weight: 82.6 kg (182 lb)   Height: 5\' 10"  (1.778 m)    Body mass index is 26.11 kg/m. Gen:  WD/WN, NAD. Appears younger than stated age Head: [place]/AT, No temporalis wasting. Ear/Nose/Throat: Hearing grossly intact, nares w/o  erythema or drainage, trachea midline Eyes:  Conjunctiva clear. Sclera non-icteric Neck: Supple.  No bruit.  Pulmonary:  Good air movement, respirations not labored Cardiac: RRR, no JVD. Vascular:  Vessel Right Left  Radial Palpable Palpable                                    Musculoskeletal: M/S 5/5 throughout.  No deformity or atrophy. No edema. Neurologic: CN 2-12 intact. Sensation grossly intact in extremities.  Symmetrical.  Speech is fluent. Motor exam as listed above. Psychiatric: Judgment intact, Mood & affect appropriate for pt's clinical situation. Dermatologic: No rashes or ulcers noted.  No cellulitis or open wounds.      CBC Lab Results  Component Value Date   WBC 9.9 09/29/2014   HGB 15.5 09/29/2014   HCT 45.7 09/29/2014   MCV 90 09/29/2014   PLT 232 09/29/2014    BMET    Component Value Date/Time   NA 144 09/29/2014 1333   K 3.8 09/29/2014 1333   CL 107 09/29/2014 1333   CO2 29 09/29/2014 1333   GLUCOSE 94 09/29/2014 1333   BUN 15 09/29/2014 1333   CREATININE 1.23 09/29/2014 1333   CALCIUM 8.6 09/29/2014 1333   GFRNONAA >60 09/29/2014 1333   GFRAA >60 09/29/2014 1333   CrCl cannot be calculated (Patient's most recent lab result is older than the maximum 21 days allowed.).  COAG No results found for: INR, PROTIME  Radiology     Assessment/Plan Essential hypertension, benign blood pressure control important in reducing the progression of atherosclerotic disease. On appropriate oral medications.  Carotid stenosis His carotid duplex today demonstrates a widely patent right carotid endarterectomy with stable 40-59% left carotid artery stenosis not significantly progressed from his previous study 6 months ago. He will continue aspirin therapy. He has previously tried a statin with intolerance to them. I will plan to recheck him in 6 months with duplex.       Leotis Pain, MD  01/08/2018 1:09 PM    This note was created with Dragon medical transcription system.  Any errors  from dictation are purely unintentional

## 2018-07-08 ENCOUNTER — Other Ambulatory Visit: Payer: Self-pay

## 2018-07-08 ENCOUNTER — Ambulatory Visit (INDEPENDENT_AMBULATORY_CARE_PROVIDER_SITE_OTHER): Payer: Medicare Other

## 2018-07-08 ENCOUNTER — Encounter (INDEPENDENT_AMBULATORY_CARE_PROVIDER_SITE_OTHER): Payer: Self-pay | Admitting: Vascular Surgery

## 2018-07-08 ENCOUNTER — Ambulatory Visit (INDEPENDENT_AMBULATORY_CARE_PROVIDER_SITE_OTHER): Payer: Medicare Other | Admitting: Vascular Surgery

## 2018-07-08 VITALS — BP 155/92 | HR 83 | Ht 69.0 in | Wt 180.0 lb

## 2018-07-08 DIAGNOSIS — I10 Essential (primary) hypertension: Secondary | ICD-10-CM

## 2018-07-08 DIAGNOSIS — I6523 Occlusion and stenosis of bilateral carotid arteries: Secondary | ICD-10-CM

## 2018-07-08 NOTE — Progress Notes (Signed)
MRN : [id number]  Andre Murphy is a 77 y.o. (May 11, 1941) male who presents with chief complaint of No chief complaint on file. Andre Murphy  History of Present Illness: Patient returns in follow-up of his carotid disease.  He is about 9 years status post right carotid endarterectomy for high-grade stenosis.  He is in great spirits today as always and I thoroughly enjoyed our talk about football and other sports today.  He does not have any focal neurologic symptoms. Specifically, the patient denies amaurosis fugax, speech or swallowing difficulties, or arm or leg weakness or numbness.  His carotid duplex today shows a widely patent right carotid endarterectomy and stable stenosis in the 40 to 59% range on the left.  Current Outpatient Medications  Medication Sig Dispense Refill  . amLODipine (NORVASC) 5 MG tablet Take 5 mg by mouth daily.    Andre Murphy aspirin EC 81 MG tablet Take 81 mg by mouth daily.    . betamethasone dipropionate (DIPROLENE) 0.05 % ointment Apply topically.    . clobetasol ointment (TEMOVATE) 0.05 % Apply topically.    . tadalafil (CIALIS) 20 MG tablet TK 1 T PO QD PRN FOR ERECTILE DYSFUNCTION     No current facility-administered medications for this visit.     Past Medical History:  Diagnosis Date  . Bursitis   . Elevated lipids   . Hypertension     Past Surgical History:  Procedure Laterality Date  . CAROTID ENDARTERECTOMY Right   . COLONOSCOPY WITH PROPOFOL N/A 08/20/2016   Procedure: COLONOSCOPY WITH PROPOFOL;  Surgeon: Manya Silvas, MD;  Location: [facility] ENDOSCOPY;  Service: Endoscopy;  Laterality: N/A;  . HERNIA REPAIR     1970  (Bilateral Inguinal Hernia)  . SHOULDER INJECTION    . THYROIDECTOMY, PARTIAL    . VASCULAR SURGERY     RT Carotid Endarterectomy    Social History  Substance Use Topics  . Smoking status: Never Smoker  . Smokeless tobacco: Never Used  . Alcohol use No     Family History No bleeding or clotting disorders       Allergies    Allergen Reactions  . Crestor [Rosuvastatin Calcium] Other (See Comments)    Muscle pain     REVIEW OF SYSTEMS(Negative unless checked)  Constitutional: [] Weight loss[] Fever[] Chills Cardiac:[] Chest pain[] Chest pressure[] Palpitations [] Shortness of breath when laying flat [] Shortness of breath at rest [] Shortness of breath with exertion. Vascular: [] Pain in legs with walking[] Pain in legsat rest[] Pain in legs when laying flat [] Claudication [] Pain in feet when walking [] Pain in feet at rest [] Pain in feet when laying flat [] History of DVT [] Phlebitis [] Swelling in legs [] Varicose veins [] Non-healing ulcers Pulmonary: [] Uses home oxygen [] Productive cough[] Hemoptysis [] Wheeze [] COPD [] Asthma Neurologic: [] Dizziness [] Blackouts [] Seizures [] History of stroke [] History of TIA[] Aphasia [] Temporary blindness[] Dysphagia [] Weaknessor numbness in arms [] Weakness or numbnessin legs Musculoskeletal: [x] Arthritis [] Joint swelling [] Joint pain [] Low back pain Hematologic:[] Easy bruising[] Easy bleeding [] Hypercoagulable state [] Anemic  Gastrointestinal:[] Blood in stool[] Vomiting blood[] Gastroesophageal reflux/heartburn[] Abdominal pain Genitourinary: [] Chronic kidney disease [] Difficulturination [] Frequenturination [] Burning with urination[] Hematuria Skin: [] Rashes [] Ulcers [] Wounds Psychological: [] History of anxiety[] History of major depression    Physical Examination  There were no vitals filed for this visit. There is no height or weight on file to calculate BMI. Gen:  WD/WN, NAD Head: /AT, No temporalis wasting. Ear/Nose/Throat: Hearing grossly intact, nares w/o erythema or drainage Eyes: Conjunctiva clear. Sclera non-icteric Neck: Supple.  Trachea midline Pulmonary:  Good air movement, respirations not labored Cardiac: RRR, No JVD Vascular:  Vessel Right Left   Radial Palpable Palpable  Musculoskeletal: M/S 5/5 throughout.  No deformity or atrophy.  Neurologic: CN 2-12 intact. Sensation grossly intact in extremities.  Symmetrical.  Speech is fluent. Motor exam as listed above. Psychiatric: Judgment intact, Mood & affect appropriate for pt's clinical situation. Dermatologic: No rashes or ulcers noted.  No cellulitis or open wounds.      CBC Lab Results  Component Value Date   WBC 9.9 09/29/2014   HGB 15.5 09/29/2014   HCT 45.7 09/29/2014   MCV 90 09/29/2014   PLT 232 09/29/2014    BMET    Component Value Date/Time   NA 144 09/29/2014 1333   K 3.8 09/29/2014 1333   CL 107 09/29/2014 1333   CO2 29 09/29/2014 1333   GLUCOSE 94 09/29/2014 1333   BUN 15 09/29/2014 1333   CREATININE 1.23 09/29/2014 1333   CALCIUM 8.6 09/29/2014 1333   GFRNONAA >60 09/29/2014 1333   GFRAA >60 09/29/2014 1333   CrCl cannot be calculated (Patient's most recent lab result is older than the maximum 21 days allowed.).  COAG No results found for: INR, PROTIME  Radiology No results found.    Assessment/Plan Essential hypertension, benign blood pressure control important in reducing the progression of atherosclerotic disease. On appropriate oral medications.  Carotid stenosis His carotid duplex today demonstrates a widely patent right carotid endarterectomy with stable 40-59% left carotid artery stenosis not significantly progressed from his previous study 6 months ago. He will continue aspirin therapy. He has previously tried a statin with intolerance to them. I will plan to recheck him in 6 months with duplex.     Leotis Pain, MD  07/08/2018 3:35 PM    This note was created with Dragon medical transcription system.  Any errors from dictation are purely unintentional

## 2019-01-16 ENCOUNTER — Encounter (INDEPENDENT_AMBULATORY_CARE_PROVIDER_SITE_OTHER): Payer: Self-pay | Admitting: Vascular Surgery

## 2019-01-16 ENCOUNTER — Other Ambulatory Visit: Payer: Self-pay

## 2019-01-16 ENCOUNTER — Ambulatory Visit (INDEPENDENT_AMBULATORY_CARE_PROVIDER_SITE_OTHER): Payer: Medicare Other | Admitting: Vascular Surgery

## 2019-01-16 ENCOUNTER — Ambulatory Visit (INDEPENDENT_AMBULATORY_CARE_PROVIDER_SITE_OTHER): Payer: Medicare Other

## 2019-01-16 VITALS — BP 166/78 | HR 75 | Resp 16 | Ht 69.0 in | Wt 185.0 lb

## 2019-01-16 DIAGNOSIS — I6523 Occlusion and stenosis of bilateral carotid arteries: Secondary | ICD-10-CM

## 2019-01-16 DIAGNOSIS — I10 Essential (primary) hypertension: Secondary | ICD-10-CM

## 2019-01-16 NOTE — Progress Notes (Signed)
MRN : [id number]  Andre Murphy is a 78 y.o. (11/27/1940) male who presents with chief complaint of No chief complaint on file. [name]  History of Present Illness: Patient returns in follow-up of his carotid disease.  He is doing well today without any focal neurologic symptoms.  He has many years past right carotid endarterectomy.  His carotid duplex today shows this to be widely patent and he has stable 40 to 59% left ICA stenosis.  Current Outpatient Medications  Medication Sig Dispense Refill  . amLODipine (NORVASC) 5 MG tablet Take 5 mg by mouth daily.    [name] aspirin EC 81 MG tablet Take 81 mg by mouth daily.    . tadalafil (CIALIS) 20 MG tablet TK 1 T PO QD PRN FOR ERECTILE DYSFUNCTION     No current facility-administered medications for this visit.     Past Medical History:  Diagnosis Date  . Bursitis   . Elevated lipids   . Hypertension     Past Surgical History:  Procedure Laterality Date  . CAROTID ENDARTERECTOMY Right   . COLONOSCOPY WITH PROPOFOL N/A 08/20/2016   Procedure: COLONOSCOPY WITH PROPOFOL;  Surgeon: Manya Silvas, MD;  Location: [facility] ENDOSCOPY;  Service: Endoscopy;  Laterality: N/A;  . HERNIA REPAIR     1970  (Bilateral Inguinal Hernia)  . SHOULDER INJECTION    . THYROIDECTOMY, PARTIAL    . VASCULAR SURGERY     RT Carotid Endarterectomy   Social History  Substance Use Topics  . Smoking status: Never Smoker  . Smokeless tobacco: Never Used  . Alcohol use No     Family History No bleeding or clotting disorders       Allergies  Allergen Reactions  . Crestor [Rosuvastatin Calcium] Other (See Comments)    Muscle pain     REVIEW OF SYSTEMS(Negative unless checked)  Constitutional: [] ?Weight loss[] ?Fever[] ?Chills Cardiac:[] ?Chest pain[] ?Chest pressure[] ?Palpitations [] ?Shortness of breath when laying flat [] ?Shortness of breath at rest [] ?Shortness of breath with exertion. Vascular: [] ?Pain in legs with  walking[] ?Pain in legsat rest[] ?Pain in legs when laying flat [] ?Claudication [] ?Pain in feet when walking [] ?Pain in feet at rest [] ?Pain in feet when laying flat [] ?History of DVT [] ?Phlebitis [] ?Swelling in legs [] ?Varicose veins [] ?Non-healing ulcers Pulmonary: [] ?Uses home oxygen [] ?Productive cough[] ?Hemoptysis [] ?Wheeze [] ?COPD [] ?Asthma Neurologic: [] ?Dizziness [] ?Blackouts [] ?Seizures [] ?History of stroke [] ?History of TIA[] ?Aphasia [] ?Temporary blindness[] ?Dysphagia [] ?Weaknessor numbness in arms [] ?Weakness or numbnessin legs Musculoskeletal: [x] ?Arthritis [] ?Joint swelling [] ?Joint pain [] ?Low back pain Hematologic:[] ?Easy bruising[] ?Easy bleeding [] ?Hypercoagulable state [] ?Anemic  Gastrointestinal:[] ?Blood in stool[] ?Vomiting blood[] ?Gastroesophageal reflux/heartburn[] ?Abdominal pain Genitourinary: [] ?Chronic kidney disease [] ?Difficulturination [] ?Frequenturination [] ?Burning with urination[] ?Hematuria Skin: [] ?Rashes [] ?Ulcers [] ?Wounds Psychological: [] ?History of anxiety[] ?History of major depression       Physical Examination  There were no vitals filed for this visit. There is no height or weight on file to calculate BMI. Gen:  WD/WN, NAD.  Appears younger than stated age. Head: [place]/AT, No temporalis wasting. Ear/Nose/Throat: Hearing grossly intact, nares w/o erythema or drainage, trachea midline Eyes: Conjunctiva clear. Sclera non-icteric Neck: Supple.  Trachea midline Pulmonary:  Good air movement, equal and clear to auscultation bilaterally.  Cardiac: RRR, No JVD Vascular:  Vessel Right Left  Radial Palpable Palpable               Musculoskeletal: M/S 5/5 throughout.  No deformity or atrophy. No edema. Neurologic: CN 2-12 intact. Sensation grossly intact in extremities.  Symmetrical.  Speech is fluent. Motor exam as listed above. Psychiatric: Judgment  intact, Mood & affect appropriate for pt's  clinical situation. Dermatologic: No rashes or ulcers noted.  No cellulitis or open wounds.      CBC Lab Results  Component Value Date   WBC 9.9 09/29/2014   HGB 15.5 09/29/2014   HCT 45.7 09/29/2014   MCV 90 09/29/2014   PLT 232 09/29/2014    BMET    Component Value Date/Time   NA 144 09/29/2014 1333   K 3.8 09/29/2014 1333   CL 107 09/29/2014 1333   CO2 29 09/29/2014 1333   GLUCOSE 94 09/29/2014 1333   BUN 15 09/29/2014 1333   CREATININE 1.23 09/29/2014 1333   CALCIUM 8.6 09/29/2014 1333   GFRNONAA >60 09/29/2014 1333   GFRAA >60 09/29/2014 1333   CrCl cannot be calculated (Patient's most recent lab result is older than the maximum 21 days allowed.).  COAG No results found for: INR, PROTIME  Radiology No results found.    Assessment/Plan Essential hypertension, benign blood pressure control important in reducing the progression of atherosclerotic disease. On appropriate oral medications.  Carotid stenosis His carotid duplex today demonstrates a widely patent right carotid endarterectomy with stable 40-59% left carotid artery stenosis not significantly progressed from his previous study 6 months ago. He will continue aspirin therapy. He has previously tried a statin with intolerance to them. I will plan to recheck him in 6 months with duplex.     Leotis Pain, MD  01/16/2019 3:25 PM    This note was created with Dragon medical transcription system.  Any errors from dictation are purely unintentional

## 2019-07-17 ENCOUNTER — Encounter (INDEPENDENT_AMBULATORY_CARE_PROVIDER_SITE_OTHER): Payer: Medicare Other

## 2019-07-17 ENCOUNTER — Ambulatory Visit (INDEPENDENT_AMBULATORY_CARE_PROVIDER_SITE_OTHER): Payer: Medicare Other | Admitting: Vascular Surgery

## 2019-10-27 ENCOUNTER — Encounter (INDEPENDENT_AMBULATORY_CARE_PROVIDER_SITE_OTHER): Payer: Medicare Other

## 2019-10-27 ENCOUNTER — Ambulatory Visit (INDEPENDENT_AMBULATORY_CARE_PROVIDER_SITE_OTHER): Payer: Medicare Other | Admitting: Vascular Surgery

## 2019-11-27 DIAGNOSIS — I219 Acute myocardial infarction, unspecified: Secondary | ICD-10-CM

## 2019-11-27 HISTORY — DX: Acute myocardial infarction, unspecified: I21.9

## 2020-03-05 ENCOUNTER — Other Ambulatory Visit: Payer: Self-pay

## 2020-03-05 ENCOUNTER — Encounter: Payer: Self-pay | Admitting: Radiology

## 2020-03-05 ENCOUNTER — Inpatient Hospital Stay
Admission: EM | Admit: 2020-03-05 | Discharge: 2020-03-10 | DRG: 280 | Disposition: A | Discharge status: Home / Self Care | Payer: Medicare Other | Attending: Internal Medicine | Admitting: Internal Medicine

## 2020-03-05 ENCOUNTER — Emergency Department: Payer: Medicare Other

## 2020-03-05 DIAGNOSIS — I214 Non-ST elevation (NSTEMI) myocardial infarction: Secondary | ICD-10-CM | POA: Diagnosis present

## 2020-03-05 DIAGNOSIS — I6521 Occlusion and stenosis of right carotid artery: Secondary | ICD-10-CM | POA: Diagnosis present

## 2020-03-05 DIAGNOSIS — Z20822 Contact with and (suspected) exposure to covid-19: Secondary | ICD-10-CM | POA: Diagnosis present

## 2020-03-05 DIAGNOSIS — I712 Thoracic aortic aneurysm, without rupture, unspecified: Secondary | ICD-10-CM

## 2020-03-05 DIAGNOSIS — D72829 Elevated white blood cell count, unspecified: Secondary | ICD-10-CM | POA: Diagnosis present

## 2020-03-05 DIAGNOSIS — J9601 Acute respiratory failure with hypoxia: Secondary | ICD-10-CM | POA: Diagnosis present

## 2020-03-05 DIAGNOSIS — J9621 Acute and chronic respiratory failure with hypoxia: Secondary | ICD-10-CM | POA: Diagnosis not present

## 2020-03-05 DIAGNOSIS — I255 Ischemic cardiomyopathy: Secondary | ICD-10-CM

## 2020-03-05 DIAGNOSIS — E785 Hyperlipidemia, unspecified: Secondary | ICD-10-CM | POA: Diagnosis present

## 2020-03-05 DIAGNOSIS — I2510 Atherosclerotic heart disease of native coronary artery without angina pectoris: Secondary | ICD-10-CM | POA: Diagnosis present

## 2020-03-05 DIAGNOSIS — I5021 Acute systolic (congestive) heart failure: Secondary | ICD-10-CM | POA: Diagnosis present

## 2020-03-05 DIAGNOSIS — I350 Nonrheumatic aortic (valve) stenosis: Secondary | ICD-10-CM | POA: Diagnosis present

## 2020-03-05 DIAGNOSIS — J810 Acute pulmonary edema: Secondary | ICD-10-CM | POA: Diagnosis not present

## 2020-03-05 DIAGNOSIS — Z823 Family history of stroke: Secondary | ICD-10-CM | POA: Diagnosis not present

## 2020-03-05 DIAGNOSIS — Z888 Allergy status to other drugs, medicaments and biological substances status: Secondary | ICD-10-CM | POA: Diagnosis not present

## 2020-03-05 DIAGNOSIS — I110 Hypertensive heart disease with heart failure: Secondary | ICD-10-CM | POA: Diagnosis present

## 2020-03-05 DIAGNOSIS — Z79899 Other long term (current) drug therapy: Secondary | ICD-10-CM

## 2020-03-05 DIAGNOSIS — I509 Heart failure, unspecified: Secondary | ICD-10-CM

## 2020-03-05 DIAGNOSIS — Z7982 Long term (current) use of aspirin: Secondary | ICD-10-CM

## 2020-03-05 DIAGNOSIS — I10 Essential (primary) hypertension: Secondary | ICD-10-CM | POA: Diagnosis not present

## 2020-03-05 DIAGNOSIS — I5020 Unspecified systolic (congestive) heart failure: Secondary | ICD-10-CM

## 2020-03-05 DIAGNOSIS — I719 Aortic aneurysm of unspecified site, without rupture: Secondary | ICD-10-CM

## 2020-03-05 HISTORY — DX: Ischemic cardiomyopathy: I25.5

## 2020-03-05 HISTORY — DX: Shortness of breath: R06.02

## 2020-03-05 HISTORY — DX: Non-ST elevation (NSTEMI) myocardial infarction: I21.4

## 2020-03-05 HISTORY — DX: Unspecified systolic (congestive) heart failure: I50.20

## 2020-03-05 LAB — CBC WITH DIFFERENTIAL/PLATELET
Abs Immature Granulocytes: 0.04 10*3/uL (ref 0.00–0.07)
Basophils Absolute: 0 10*3/uL (ref 0.0–0.1)
Basophils Relative: 0 %
Eosinophils Absolute: 0 10*3/uL (ref 0.0–0.5)
Eosinophils Relative: 0 %
HCT: 40.9 % (ref 39.0–52.0)
Hemoglobin: 14.1 g/dL (ref 13.0–17.0)
Immature Granulocytes: 0 %
Lymphocytes Relative: 6 %
Lymphs Abs: 0.9 10*3/uL (ref 0.7–4.0)
MCH: 30.2 pg (ref 26.0–34.0)
MCHC: 34.5 g/dL (ref 30.0–36.0)
MCV: 87.6 fL (ref 80.0–100.0)
Monocytes Absolute: 1.4 10*3/uL — ABNORMAL HIGH (ref 0.1–1.0)
Monocytes Relative: 10 %
Neutro Abs: 12.5 10*3/uL — ABNORMAL HIGH (ref 1.7–7.7)
Neutrophils Relative %: 84 %
Platelets: 199 10*3/uL (ref 150–400)
RBC: 4.67 MIL/uL (ref 4.22–5.81)
RDW: 12.9 % (ref 11.5–15.5)
WBC: 14.8 10*3/uL — ABNORMAL HIGH (ref 4.0–10.5)
nRBC: 0 % (ref 0.0–0.2)

## 2020-03-05 LAB — URINE DRUG SCREEN, QUALITATIVE (ARMC ONLY)
Amphetamines, Ur Screen: NOT DETECTED
Barbiturates, Ur Screen: NOT DETECTED
Benzodiazepine, Ur Scrn: POSITIVE — AB
Cannabinoid 50 Ng, Ur ~~LOC~~: NOT DETECTED
Cocaine Metabolite,Ur ~~LOC~~: NOT DETECTED
MDMA (Ecstasy)Ur Screen: NOT DETECTED
Methadone Scn, Ur: NOT DETECTED
Opiate, Ur Screen: NOT DETECTED
Phencyclidine (PCP) Ur S: NOT DETECTED
Tricyclic, Ur Screen: NOT DETECTED

## 2020-03-05 LAB — URINALYSIS, ROUTINE W REFLEX MICROSCOPIC
Bilirubin Urine: NEGATIVE
Glucose, UA: NEGATIVE mg/dL
Hgb urine dipstick: NEGATIVE
Ketones, ur: 5 mg/dL — AB
Leukocytes,Ua: NEGATIVE
Nitrite: NEGATIVE
Protein, ur: NEGATIVE mg/dL
Specific Gravity, Urine: 1.019 (ref 1.005–1.030)
pH: 5 (ref 5.0–8.0)

## 2020-03-05 LAB — TROPONIN I (HIGH SENSITIVITY)
Troponin I (High Sensitivity): 1053 ng/L (ref <18)
Troponin I (High Sensitivity): 1206 ng/L (ref <18)
Troponin I (High Sensitivity): 1630 ng/L (ref <18)
Troponin I (High Sensitivity): 1510 ng/L (ref <18)

## 2020-03-05 LAB — APTT: aPTT: 36 seconds (ref 24–36)

## 2020-03-05 LAB — HEPARIN LEVEL (UNFRACTIONATED): Heparin Unfractionated: 0.44 IU/mL (ref 0.30–0.70)

## 2020-03-05 LAB — RESPIRATORY PANEL BY RT PCR (FLU A&B, COVID)
Influenza A by PCR: NEGATIVE
Influenza B by PCR: NEGATIVE
SARS Coronavirus 2 by RT PCR: NEGATIVE

## 2020-03-05 LAB — BASIC METABOLIC PANEL
Anion gap: 7 (ref 5–15)
BUN: 28 mg/dL — ABNORMAL HIGH (ref 8–23)
CO2: 25 mmol/L (ref 22–32)
Calcium: 8.7 mg/dL — ABNORMAL LOW (ref 8.9–10.3)
Chloride: 107 mmol/L (ref 98–111)
Creatinine, Ser: 1.1 mg/dL (ref 0.61–1.24)
GFR calc Af Amer: >60 mL/min (ref >60)
GFR calc non Af Amer: >60 mL/min (ref >60)
Glucose, Bld: 126 mg/dL — ABNORMAL HIGH (ref 70–99)
Potassium: 4.2 mmol/L (ref 3.5–5.1)
Sodium: 139 mmol/L (ref 135–145)

## 2020-03-05 LAB — BRAIN NATRIURETIC PEPTIDE: B Natriuretic Peptide: 657 pg/mL — ABNORMAL HIGH (ref 0.0–100.0)

## 2020-03-05 LAB — PROTIME-INR
INR: 1.1 (ref 0.8–1.2)
Prothrombin Time: 14.1 seconds (ref 11.4–15.2)

## 2020-03-05 MED ORDER — HYDRALAZINE HCL 20 MG/ML IJ SOLN: 5.0000 mg | INTRAMUSCULAR | Status: DC | PRN

## 2020-03-05 MED ORDER — ASPIRIN EC 81 MG PO TBEC
81.0000 mg | DELAYED_RELEASE_TABLET | Freq: Every day | ORAL | Status: DC
  Administered 2020-03-06 – 2020-03-10 (×5): 81 mg via ORAL
  Filled 2020-03-05 (×5): qty 1

## 2020-03-05 MED ORDER — NITROGLYCERIN 0.4 MG SL SUBL: 0.4000 mg | SUBLINGUAL_TABLET | SUBLINGUAL | Status: DC | PRN

## 2020-03-05 MED ORDER — ALBUTEROL SULFATE (2.5 MG/3ML) 0.083% IN NEBU
2.5000 mg | INHALATION_SOLUTION | RESPIRATORY_TRACT | Status: DC
  Administered 2020-03-05 (×3): 2.5 mg via RESPIRATORY_TRACT
  Filled 2020-03-05 (×5): qty 3

## 2020-03-05 MED ORDER — SODIUM CHLORIDE 0.9% FLUSH
3.0000 mL | Freq: Two times a day (BID) | INTRAVENOUS | Status: DC
  Administered 2020-03-06 – 2020-03-10 (×6): 3 mL via INTRAVENOUS

## 2020-03-05 MED ORDER — ASPIRIN 81 MG PO CHEW
324.0000 mg | CHEWABLE_TABLET | Freq: Once | ORAL | Status: AC
  Administered 2020-03-05: 324 mg via ORAL
  Filled 2020-03-05: qty 4

## 2020-03-05 MED ORDER — AMLODIPINE BESYLATE 5 MG PO TABS
5.0000 mg | ORAL_TABLET | Freq: Every day | ORAL | Status: DC
  Administered 2020-03-06 – 2020-03-07 (×2): 5 mg via ORAL
  Filled 2020-03-05 (×2): qty 1

## 2020-03-05 MED ORDER — IOHEXOL 350 MG/ML SOLN
75.0000 mL | Freq: Once | INTRAVENOUS | Status: AC | PRN
  Administered 2020-03-05: 75 mL via INTRAVENOUS

## 2020-03-05 MED ORDER — DM-GUAIFENESIN ER 30-600 MG PO TB12
1.0000 | ORAL_TABLET | Freq: Two times a day (BID) | ORAL | Status: DC
  Administered 2020-03-05 – 2020-03-10 (×10): 1 via ORAL
  Filled 2020-03-05 (×10): qty 1

## 2020-03-05 MED ORDER — FUROSEMIDE 10 MG/ML IJ SOLN
20.0000 mg | Freq: Every day | INTRAMUSCULAR | Status: DC
  Administered 2020-03-05 – 2020-03-06 (×2): 20 mg via INTRAVENOUS
  Filled 2020-03-05 (×3): qty 4

## 2020-03-05 MED ORDER — HEPARIN (PORCINE) 25000 UT/250ML-% IV SOLN
1750.0000 [IU]/h | INTRAVENOUS | Status: DC
  Administered 2020-03-05: 1300 [IU]/h via INTRAVENOUS
  Administered 2020-03-06: 1550 [IU]/h via INTRAVENOUS
  Administered 2020-03-06: 1300 [IU]/h via INTRAVENOUS
  Administered 2020-03-07: 1750 [IU]/h via INTRAVENOUS
  Filled 2020-03-05 (×4): qty 250

## 2020-03-05 MED ORDER — ONDANSETRON HCL 4 MG/2ML IJ SOLN: 4.0000 mg | Freq: Three times a day (TID) | INTRAMUSCULAR | Status: DC | PRN

## 2020-03-05 MED ORDER — HEPARIN BOLUS VIA INFUSION
4000.0000 [IU] | Freq: Once | INTRAVENOUS | Status: AC
  Administered 2020-03-05: 4000 [IU] via INTRAVENOUS
  Filled 2020-03-05: qty 4000

## 2020-03-05 MED ORDER — TEMAZEPAM 15 MG PO CAPS: 15.0000 mg | ORAL_CAPSULE | Freq: Every evening | ORAL | Status: DC | PRN

## 2020-03-05 MED ORDER — MORPHINE SULFATE (PF) 2 MG/ML IV SOLN: 1.0000 mg | INTRAVENOUS | Status: DC | PRN

## 2020-03-05 MED ORDER — ACETAMINOPHEN 325 MG PO TABS: 650.0000 mg | ORAL_TABLET | Freq: Four times a day (QID) | ORAL | Status: DC | PRN

## 2020-03-05 NOTE — ED Notes (Signed)
Pt states he feels much better than before. Pt is AOX4, NAD noted. Pt provided snack and beverages, wife at bedside offered refreshments. Pt provided urinal, TV remote, and additional pillow.

## 2020-03-05 NOTE — ED Provider Notes (Signed)
[facility] Emergency Department Provider Note  ____________________________________________   First MD Initiated Contact with Patient 03/05/20 [phone]     (approximate)  I have reviewed the triage vital signs and the nursing notes.   HISTORY  Chief Complaint Shortness of Breath    HPI Andre POTEMPA Sr. is a 79 y.o. male who presents by private vehicle for evaluation of gradually worsening shortness of breath over about the last 12 hours.  He has had several episodes of shortness of breath with exertion and some chest tightness recently.  He says that he had a cardiac stress test about 2 weeks ago and was told that the results were good.  However he started feeling a fluttering in his chest and some tightness and shortness of breath earlier tonight.  It went away but then started again about 5:30 PM and has gradually gotten worse.  He has not been able to get any sleep tonight.  He does not think it is any worse when he lies flat but he has been very uncomfortable and feels palpitations and chest tightness.  The symptoms have become severe.  He had significantly increased work of breathing  when he arrived to the emergency department.  When he was transferring from a wheelchair to the exam bed he had an oxygen saturation of 85 to 86% on room air.  The patient reports no prior cardiac history and no history of lung disease.  He has never been a smoker.  He has no history of blood clots in the legs of the lungs.  He has not been on any long trips recently or had any immobilizations.  He has received both of his COVID-19 vaccinations.  He denies fever/chills, sore throat, nausea, vomiting, abdominal pain, and dysuria.        Past Medical History:  Diagnosis Date  . Bursitis   . Elevated lipids   . Hypertension     Patient Active Problem List   Diagnosis Date Noted  . NSTEMI (non-ST elevated myocardial infarction) ([place]) 03/05/2020  . Acute respiratory failure  with hypoxia ([place]) 03/05/2020  . Leukocytosis 03/05/2020  . HLD (hyperlipidemia) 03/05/2020  . Essential hypertension, benign 12/28/2016  . Carotid stenosis 12/28/2016    Past Surgical History:  Procedure Laterality Date  . CAROTID ENDARTERECTOMY Right   . COLONOSCOPY WITH PROPOFOL N/A 08/20/2016   Procedure: COLONOSCOPY WITH PROPOFOL;  Surgeon: Manya Silvas, MD;  Location: [facility] ENDOSCOPY;  Service: Endoscopy;  Laterality: N/A;  . HERNIA REPAIR     1970  (Bilateral Inguinal Hernia)  . SHOULDER INJECTION    . THYROIDECTOMY, PARTIAL    . VASCULAR SURGERY     RT Carotid Endarterectomy    Prior to Admission medications   Medication Sig Start Date End Date Taking? Authorizing Provider  amLODipine (NORVASC) 5 MG tablet Take 5 mg by mouth daily.    [provider]  aspirin EC 81 MG tablet Take 81 mg by mouth daily.    [provider]  tadalafil (CIALIS) 20 MG tablet TK 1 T PO QD PRN FOR ERECTILE DYSFUNCTION 12/27/16   [provider]    Allergies Crestor [rosuvastatin calcium] and Rosuvastatin  Family History  Problem Relation Age of Onset  . Stroke Mother   . Cancer Father     Social History Social History   Tobacco Use  . Smoking status: Never Smoker  . Smokeless tobacco: Never Used  Substance Use Topics  . Alcohol use: No  .  Drug use: No    Review of Systems Constitutional: No fever/chills Eyes: No visual changes. ENT: No sore throat. Cardiovascular: Chest tightness Respiratory: Shortness of breath, worse with exertion Gastrointestinal: No abdominal pain.  No nausea, no vomiting.  No diarrhea.  No constipation. Genitourinary: Negative for dysuria. Musculoskeletal: Negative for neck pain.  Negative for back pain. Integumentary: Negative for rash. Neurological: Negative for headaches, focal weakness or numbness.   ____________________________________________   PHYSICAL EXAM:  VITAL SIGNS: ED Triage Vitals  Enc Vitals Group      BP 03/05/20 0515 139/85     Pulse Rate 03/05/20 0515 (!) 123     Resp 03/05/20 0515 18     Temp 03/05/20 0515 97.6 F (36.4 C)     Temp Source 03/05/20 0515 Oral     SpO2 03/05/20 0515 93 %     Weight 03/05/20 0516 77.1 kg (170 lb)     Height 03/05/20 0516 1.778 m (5\' 10" )     Head Circumference --      Peak Flow --      Pain Score 03/05/20 0516 0     Pain Loc --      Pain Edu? --      Excl. in [place]? --     Constitutional: Alert and oriented.  Mild respiratory distress on 2 L of oxygen by nasal cannula. Eyes: Conjunctivae are normal.  Head: Atraumatic. Nose: No congestion/rhinnorhea. Mouth/Throat: Patient is wearing a mask. Neck: No stridor.  No meningeal signs.   Cardiovascular: Tachycardia in the 120s, regular rhythm. Good peripheral circulation. Grossly normal heart sounds. Respiratory: Increased work of breathing with some intercostal retractions and accessory muscle usage but clear breath sounds and no wheezing. Gastrointestinal: Soft and nontender. No distention.  Musculoskeletal: No lower extremity tenderness nor edema. No gross deformities of extremities. Neurologic:  Normal speech and language. No gross focal neurologic deficits are appreciated.  Skin:  Skin is warm, dry and intact. Psychiatric: Mood and affect are normal. Speech and behavior are normal.  ____________________________________________   LABS (all labs ordered are listed, but only abnormal results are displayed)  Labs Reviewed  CBC WITH DIFFERENTIAL/PLATELET - Abnormal; Notable for the following components:      Result Value   WBC 14.8 (*)    Neutro Abs 12.5 (*)    Monocytes Absolute 1.4 (*)    All other components within normal limits  BASIC METABOLIC PANEL - Abnormal; Notable for the following components:   Glucose, Bld 126 (*)    BUN 28 (*)    Calcium 8.7 (*)    All other components within normal limits  TROPONIN I (HIGH SENSITIVITY) - Abnormal; Notable for the following components:   Troponin  I (High Sensitivity) 1,053 (*)    All other components within normal limits  RESPIRATORY PANEL BY RT PCR (FLU A&B, COVID)  PROTIME-INR  APTT  HEPARIN LEVEL (UNFRACTIONATED)  BRAIN NATRIURETIC PEPTIDE  TROPONIN I (HIGH SENSITIVITY)   ____________________________________________  EKG  ED ECG REPORT I, Hinda Kehr, the attending physician, personally viewed and interpreted this ECG.  Date: 03/05/2020 EKG Time: 5:16 AM Rate: 124 Rhythm: Sinus tachycardia QRS Axis: normal Intervals: normal ST/T Wave abnormalities: ST depression in leads I, aVL, V2, V3, V4, and V5.  About 1 mm of ST elevation in lead III. narrative Interpretation: Does not meet criteria for STEMI but does suggest acute ischemia.  ____________________________________________  RADIOLOGY I, Hinda Kehr, personally viewed and evaluated these images (plain radiographs) as part of my medical  decision making, as well as reviewing the written report by the radiologist.  ED MD interpretation:  Interstitial pulmonary edema and small pleural effusions, no PE  Official radiology report(s): CT Angio Chest PE W/Cm &/Or Wo Cm  Result Date: 03/05/2020 CLINICAL DATA:  Shortness of breath EXAM: CT ANGIOGRAPHY CHEST WITH CONTRAST TECHNIQUE: Multidetector CT imaging of the chest was performed using the standard protocol during bolus administration of intravenous contrast. Multiplanar CT image reconstructions and MIPs were obtained to evaluate the vascular anatomy. CONTRAST:  74mL OMNIPAQUE IOHEXOL 350 MG/ML SOLN COMPARISON:  None. FINDINGS: Cardiovascular: Satisfactory opacification of the pulmonary arteries to the proximal segmental level. No evidence of pulmonary embolism. Normal heart size. Reflux of contrast into the hepatic veins, which could reflect right heart dysfunction. Coronary artery and aortic calcifications. No pericardial effusion. Mediastinum/Nodes: Mildly prominent mediastinal and hilar nodes, likely reactive. Thyroid  appears partially absent and not well evaluated due to artifact. Lungs/Pleura: Small bilateral pleural effusions and mild bibasilar atelectasis. Diffuse interlobular septal thickening. Mild peribronchial thickening. No pneumothorax. Upper Abdomen: Probable small hiatal hernia.  Small hepatic cysts. Musculoskeletal: No chest wall abnormality. Mild thoracic spine degenerative changes. No acute osseous abnormality. Review of the MIP images confirms the above findings. IMPRESSION: Suboptimal evaluation due to motion artifact. No evidence of acute pulmonary embolism. Interstitial pulmonary edema.  Small bilateral pleural effusions. Electronically Signed   By: Macy Mis M.D.   On: 03/05/2020 07:42    ____________________________________________   PROCEDURES   Procedure(s) performed (including Critical Care):  .1-3 Lead EKG Interpretation Performed by: Hinda Kehr, MD Authorized by: Hinda Kehr, MD     Interpretation: abnormal     ECG rate:  123   ECG rate assessment: tachycardic     Rhythm: sinus tachycardia     Ectopy: none   .Critical Care Performed by: Hinda Kehr, MD Authorized by: Hinda Kehr, MD   Critical care provider statement:    Critical care time (minutes):  60   Critical care time was exclusive of:  Separately billable procedures and treating other patients   Critical care was necessary to treat or prevent imminent or life-threatening deterioration of the following conditions:  Respiratory failure and cardiac failure   Critical care was time spent personally by me on the following activities:  Development of treatment plan with patient or surrogate, discussions with consultants, evaluation of patient's response to treatment, examination of patient, obtaining history from patient or surrogate, ordering and performing treatments and interventions, ordering and review of laboratory studies, ordering and review of radiographic studies, pulse oximetry, re-evaluation of  patient's condition and review of old charts     ____________________________________________   INITIAL IMPRESSION / MDM / Industry / ED COURSE  As part of my medical decision making, I reviewed the following data within the [place] notes reviewed and incorporated, Labs reviewed , EKG interpreted , Old chart reviewed, Discussed with cardiologist (Dr. [name]), Discussed with admitting physician (Dr. Blaine Hamper), Discussed with radiologist and Notes from prior ED visits   Differential diagnosis includes, but is not limited to, ACS, PE, new onset heart failure, acute infection such as pneumonia or COVID-19.  I think COVID-19 is unlikely given that he has had his vaccinations and no other infectious symptoms.  He has had several episodes of exertional dyspnea and some chest discomfort recently and says that he had a stress test which I can see in the system but I cannot see the results.  He is currently hypoxemic  on room air and tachycardic and I am concerned about pulmonary embolism though I do not know what would have been the inciting event.  Once I verified his renal function I will obtain a CTA chest to rule out pulmonary embolism.  No role for chest x-ray right now as I am planning to scan him regardless.  PCR COVID-19 test is pending.  Labs are pending.  EKG is nonspecific but suggestive of ischemia though does not meet STEMI criteria.  Patient is much more comfortable on 2 L of oxygen by nasal cannula, no indication for BiPAP currently.  I will keep the patient on a cardiac monitor to evaluate for any cardiac arrhythmias or significant rate changes.      Clinical Course as of Mar 06 807  Sat Mar 05, 2020  0605 WBC(!): 14.8 [CF]  0620 Highly concerning troponin.  Could still be due to PE and I will proceed with CTA chest, but I have ordered ASA 324 mg PO and heparin bolus + infusion.  Will update patient.  Troponin I (High Sensitivity)(!!): 1,053 [CF]  [id number]  I spoke by phone with Dr. [name] with the cardiologist service about the patient and my concerns for NSTEMI versus PE.  I sent him the name and medical record number through secure chat and [facility] and let him know that the patient would be admitted for urgent cardiology consult.  Assuming that the patient does not have a pulmonary embolism I feel that he may be a good candidate for cardiac catheterization if cardiology feels this is appropriate.  He agrees with my management thus far.  CTA is pending.   [CF]  [id number] Patient has developed some wheezing.  He said he feels about the same but he did say he got more short of breath during the CTA.  The nurses bumped his oxygen up to 6 L by nasal cannula and his oxygen level was still 88%.  I went back to evaluate him and he says he feels about the same and is speaking in full sentences, continuing to use of accessory muscles but not worse than before.  He is mildly diaphoretic at this time.  He still is having no chest pain at all.I quickly reviewed the CTA chest and it looks like he has bilateral pleural effusions.  I do not see an obvious pulmonary embolism.  I have put the patient on BiPAP to help with his work of breathing and consulted the hospitalist for admission.   [CF]  [phone] CT is consistent with interstitial pulmonary edema.  I suspect that this is due to an ongoing NSTEMI and resulting heart failure.  BiPAP should help with the immediate stabilization.  Blood pressure is stable.  Awaiting callback from hospitalist service.  I also updated Dr. [name] and put in the formal cardiology consult for Dr. [name] to see the patient at his earliest convenience.  CT Angio Chest PE W/Cm &/Or Wo Cm [CF]  [id number] I discussed the case by phone with Dr. Blaine Hamper with the hospitalist service.  We discussed the case in detail and he agrees with the plan for admission.  I also reassessed the patient.  He is on BiPAP and tolerating it well, with improved work of breathing.  His  oxygen saturation is 96% on 40% O2 on the BiPAP.  He is able to speak in full sentences and said he is feeling a little bit better.  Still no chest pain.   [CF]    Clinical Course User  Index [CF] Hinda Kehr, MD     ____________________________________________  FINAL CLINICAL IMPRESSION(S) / ED DIAGNOSES  Final diagnoses:  NSTEMI (non-ST elevated myocardial infarction) ([place])  Acute respiratory failure with hypoxemia ([place])  Acute pulmonary edema ([place])  New onset of congestive heart failure ([place])     MEDICATIONS GIVEN DURING THIS VISIT:  Medications  heparin ADULT infusion 100 units/mL (25000 units/261mL sodium chloride 0.45%) (1,300 Units/hr Intravenous New Bag/Given 03/05/20 0636)  aspirin chewable tablet 324 mg (324 mg Oral Given 03/05/20 [id number])  heparin bolus via infusion 4,000 Units (4,000 Units Intravenous Bolus from Bag 03/05/20 0636)  iohexol (OMNIPAQUE) 350 MG/ML injection 75 mL (75 mLs Intravenous Contrast Given 03/05/20 0706)     ED Discharge Orders    None      *Please note:  Andre BRUST Sr. was evaluated in Emergency Department on 03/05/2020 for the symptoms described in the history of present illness. He was evaluated in the context of the global COVID-19 pandemic, which necessitated consideration that the patient might be at risk for infection with the SARS-CoV-2 virus that causes COVID-19. Institutional protocols and algorithms that pertain to the evaluation of patients at risk for COVID-19 are in a state of rapid change based on information released by regulatory bodies including the CDC and federal and state organizations. These policies and algorithms were followed during the patient's care in the ED.  Some ED evaluations and interventions may be delayed as a result of limited staffing during the pandemic.*  Note:  This document was prepared using Dragon voice recognition software and may include unintentional dictation errors.   Hinda Kehr, MD 03/05/20  [phone]

## 2020-03-05 NOTE — Progress Notes (Signed)
ANTICOAGULATION CONSULT NOTE - Initial Consult  Pharmacy Consult for heparin Indication: chest pain/ACS  Allergies  Allergen Reactions  . Crestor [Rosuvastatin Calcium] Other (See Comments)    Muscle pain  . Rosuvastatin     Other reaction(s): Muscle Pain    Patient Measurements: Height: 5\' 10"  (177.8 cm) Weight: 77.1 kg (170 lb) IBW/kg (Calculated) : 73 Heparin Dosing Weight: 77.1 kg  Vital Signs: Temp: 97.6 F (36.4 C) (04/10 0515) Temp Source: Oral (04/10 0515) BP: 139/85 (04/10 0515) Pulse Rate: 118 (04/10 0530)  Labs: Recent Labs    03/05/20 0537  HGB 14.1  HCT 40.9  PLT 199  CREATININE 1.10  TROPONINIHS 1,053*    Estimated Creatinine Clearance: 57.1 mL/min (by C-G formula based on SCr of 1.1 mg/dL).   Medical History: Past Medical History:  Diagnosis Date  . Bursitis   . Elevated lipids   . Hypertension     Medications:  Scheduled:  . aspirin  324 mg Oral Once  . heparin  4,000 Units Intravenous Once    Assessment: Patient arrives w/ SOB w/ on 86% RA placed on 2L O2, initial trop of 1053, EKG showing ST depression in aVL, and T wave abnormalities. No anticoagulation PTA. Patient is being started on heparin drip for management of NSTEMI. Baseline CBC WNL, aPTT/INR pending.  Goal of Therapy:  Heparin level 0.3-0.7 units/ml Monitor platelets by anticoagulation protocol: Yes   Plan:  Will bolus heparin 4000 units IV x 1 Will start rate at 1300 units/hr. Will check anti-Xa at 1500. Will monitor daily CBC's and adjust per anti-Xa levels.  Tobie Lords, PharmD, BCPS Clinical Pharmacist 03/05/2020,6:28 AM

## 2020-03-05 NOTE — ED Notes (Signed)
RT at bedside to place pt on bipap

## 2020-03-05 NOTE — ED Notes (Signed)
Lung sounds clear bilaterally, no dyspnea noted at rest.

## 2020-03-05 NOTE — ED Notes (Signed)
Pt ambulatory sats 86% on room air

## 2020-03-05 NOTE — ED Notes (Signed)
Assumed care at this time. Report from [place], [place]

## 2020-03-05 NOTE — ED Notes (Signed)
Wife states that "the food sucks here" and states she will have someone drop off meal for pt.

## 2020-03-05 NOTE — ED Notes (Signed)
Pt requesting to have bipap taken off. Respirations appear to be improved at this time. Pt placed on 6L nasal cannula. Oxygen saturation 95% on 6L. MD aware.

## 2020-03-05 NOTE — ED Notes (Signed)
Pt on 2L

## 2020-03-05 NOTE — ED Notes (Signed)
3 liters applied via nasal cannula.

## 2020-03-05 NOTE — ED Notes (Addendum)
Pt transported to inpatient floor at this time in stretcher- cardiac monitoring present. Pt appears in no acute distress- sleeping at this time.

## 2020-03-05 NOTE — Consult Note (Signed)
[facility] Consultation Note  Patient ID: Andre Dietert., MRN: [id number], DOB/AGE: 1941/03/14 79 y.o. Admit date: 03/05/2020   Date of Consult: 03/05/2020 Primary Physician: Maryland Pink, MD Primary Cardiologist: None  Chief Complaint:  Chief Complaint  Patient presents with  . Shortness of Breath   Reason for Consult: Shortness of breath elevated troponin  HPI: 79 y.o. male with known peripheral vascular disease hypertension hyperlipidemia previously on appropriate medication management who has had significant progression of shortness of breath with physical activity and now some chest pain and shortness of breath at rest.  The patient had relief of chest discomfort when admitted to the hospital and adding nitrates and heparin.  Chest x-ray has no evidence of congestive heart failure.  EKG has shown sinus tachycardia with lateral ST depression and troponin is 11/27/2004 consistent with non-ST elevation myocardial infarction.  The patient does have peripheral vascular disease for which she has had previous intervention but stable at this time.  The patient currently is hemodynamically stable  Past Medical History:  Diagnosis Date  . Bursitis   . Elevated lipids   . Hypertension       Surgical History:  Past Surgical History:  Procedure Laterality Date  . CAROTID ENDARTERECTOMY Right   . COLONOSCOPY WITH PROPOFOL N/A 08/20/2016   Procedure: COLONOSCOPY WITH PROPOFOL;  Surgeon: Manya Silvas, MD;  Location: [facility] ENDOSCOPY;  Service: Endoscopy;  Laterality: N/A;  . HERNIA REPAIR     1970  (Bilateral Inguinal Hernia)  . SHOULDER INJECTION    . THYROIDECTOMY, PARTIAL    . VASCULAR SURGERY     RT Carotid Endarterectomy     Home Meds: Prior to Admission medications   Medication Sig Start Date End Date Taking? Authorizing Provider  amLODipine (NORVASC) 5 MG tablet Take 5 mg by mouth daily.   Yes [provider]  aspirin EC 81 MG tablet Take 81 mg by  mouth daily.   Yes [provider]  tadalafil (CIALIS) 20 MG tablet TK 1 T PO QD PRN FOR ERECTILE DYSFUNCTION 12/27/16  Yes [provider]  temazepam (RESTORIL) 15 MG capsule Take 15 mg by mouth at bedtime as needed. 12/08/19  Yes [provider]  vitamin B-12 (CYANOCOBALAMIN) 250 MCG tablet Take 250 mcg by mouth as needed.   Yes [provider]    Inpatient Medications:  . albuterol  2.5 mg Nebulization Q4H  . dextromethorphan-guaiFENesin  1 tablet Oral BID  . furosemide  20 mg Intravenous Daily   . heparin 1,300 Units/hr (03/05/20 0636)    Allergies:  Allergies  Allergen Reactions  . Crestor [Rosuvastatin Calcium] Other (See Comments)    Muscle pain  . Rosuvastatin     Other reaction(s): Muscle Pain    Social History   Socioeconomic History  . Marital status: Married    Spouse name: Not on file  . Number of children: Not on file  . Years of education: Not on file  . Highest education level: Not on file  Occupational History  . Not on file  Tobacco Use  . Smoking status: Never Smoker  . Smokeless tobacco: Never Used  Substance and Sexual Activity  . Alcohol use: No  . Drug use: No  . Sexual activity: Not on file  Other Topics Concern  . Not on file  Social History Narrative  . Not on file   Social Determinants of Health   Financial Resource Strain:   . Difficulty of Paying Living Expenses:  Food Insecurity:   . Worried About Charity fundraiser in the Last Year:   . Arboriculturist in the Last Year:   Transportation Needs:   . Film/video editor (Medical):   [name] Lack of Transportation (Non-Medical):   Physical Activity:   . Days of Exercise per Week:   . Minutes of Exercise per Session:   Stress:   . Feeling of Stress :   Social Connections:   . Frequency of Communication with Friends and Family:   . Frequency of Social Gatherings with Friends and Family:   . Attends Religious Services:   . Active Member of Clubs or  Organizations:   . Attends Archivist Meetings:   [name] Marital Status:   Intimate Partner Violence:   . Fear of Current or Ex-Partner:   . Emotionally Abused:   [name] Physically Abused:   . Sexually Abused:      Family History  Problem Relation Age of Onset  . Stroke Mother   . Cancer Father      Review of Systems Positive for chest pain shortness of breath Negative for: General:  chills, fever, night sweats or weight changes.  Cardiovascular: PND orthopnea syncope dizziness  Dermatological skin lesions rashes Respiratory: Cough congestion Urologic: Frequent urination urination at night and hematuria Abdominal: negative for nausea, vomiting, diarrhea, bright red blood per rectum, melena, or hematemesis Neurologic: negative for visual changes, and/or hearing changes  All other systems reviewed and are otherwise negative except as noted above.  Labs: No results for input(s): CKTOTAL, CKMB, TROPONINI in the last 72 hours. Lab Results  Component Value Date   WBC 14.8 (H) 03/05/2020   HGB 14.1 03/05/2020   HCT 40.9 03/05/2020   MCV 87.6 03/05/2020   PLT 199 03/05/2020    Recent Labs  Lab 03/05/20 0537  NA 139  K 4.2  CL 107  CO2 25  BUN 28*  CREATININE 1.10  CALCIUM 8.7*  GLUCOSE 126*   No results found for: CHOL, HDL, LDLCALC, TRIG No results found for: DDIMER  Radiology/Studies:  CT Angio Chest PE W/Cm &/Or Wo Cm  Result Date: 03/05/2020 CLINICAL DATA:  Shortness of breath EXAM: CT ANGIOGRAPHY CHEST WITH CONTRAST TECHNIQUE: Multidetector CT imaging of the chest was performed using the standard protocol during bolus administration of intravenous contrast. Multiplanar CT image reconstructions and MIPs were obtained to evaluate the vascular anatomy. CONTRAST:  5mL OMNIPAQUE IOHEXOL 350 MG/ML SOLN COMPARISON:  None. FINDINGS: Cardiovascular: Satisfactory opacification of the pulmonary arteries to the proximal segmental level. No evidence of pulmonary embolism.  Normal heart size. Reflux of contrast into the hepatic veins, which could reflect right heart dysfunction. Coronary artery and aortic calcifications. No pericardial effusion. Mediastinum/Nodes: Mildly prominent mediastinal and hilar nodes, likely reactive. Thyroid appears partially absent and not well evaluated due to artifact. Lungs/Pleura: Small bilateral pleural effusions and mild bibasilar atelectasis. Diffuse interlobular septal thickening. Mild peribronchial thickening. No pneumothorax. Upper Abdomen: Probable small hiatal hernia.  Small hepatic cysts. Musculoskeletal: No chest wall abnormality. Mild thoracic spine degenerative changes. No acute osseous abnormality. Review of the MIP images confirms the above findings. IMPRESSION: Suboptimal evaluation due to motion artifact. No evidence of acute pulmonary embolism. Interstitial pulmonary edema.  Small bilateral pleural effusions. Electronically Signed   By: Macy Mis M.D.   On: 03/05/2020 07:42    EKG: Normal sinus rhythm with lateral ST depression  Weights: Filed Weights   03/05/20 0516  Weight: 77.1 kg  Physical Exam: Blood pressure 138/89, pulse 100, temperature 97.6 F (36.4 C), temperature source Oral, resp. rate 20, height 5\' 10"  (1.778 m), weight 77.1 kg, SpO2 96 %. Body mass index is 24.39 kg/m. General: Well developed, well nourished, in no acute distress. Head eyes ears nose throat: Normocephalic, atraumatic, sclera non-icteric, no xanthomas, nares are without discharge. No apparent thyromegaly and/or mass  Lungs: Normal respiratory effort.  no wheezes, no rales, no rhonchi.  Heart: RRR with normal S1 S2. no murmur gallop, no rub, PMI is normal size and placement, carotid upstroke normal without bruit, jugular venous pressure is normal Abdomen: Soft, non-tender, non-distended with normoactive bowel sounds. No hepatomegaly. No rebound/guarding. No obvious abdominal masses. Abdominal aorta is normal size without  bruit Extremities: No edema. no cyanosis, no clubbing, no ulcers  Peripheral : 2+ bilateral upper extremity pulses, 2+ bilateral femoral pulses, 2+ bilateral dorsal pedal pulse Neuro: Alert and oriented. No facial asymmetry. No focal deficit. Moves all extremities spontaneously. Musculoskeletal: Normal muscle tone without kyphosis Psych:  Responds to questions appropriately with a normal affect.    Assessment: 79 year old male with peripheral vascular disease hypertension hyperlipidemia having a non-ST elevation myocardial infarction  Plan: 1.  Aspirin heparin beta-blocker nitrates for acute non-ST elevation myocardial infarction chest discomfort and shortness of breath 2.  High intensity cholesterol therapy 3.  Further evaluation and treatment options of cardiovascular disease and non-ST elevation myocardial infarction with cardiac catheterization.  Patient understands risk and benefits of cardiac catheterization.  This includes the possibility of death stroke heart attack infection bleeding or blood clot.  He is at low risk for conscious sedation  Signed, Corey Skains M.D. [facility] 03/05/2020, 2:16 PM

## 2020-03-05 NOTE — ED Triage Notes (Signed)
Patient reports shortness of breath over the past 2-3 days.

## 2020-03-05 NOTE — H&P (Signed)
History and Physical    Andre Murphy [id number] DOB: [birth year] DOA: 03/05/2020  Referring MD/NP/PA:   PCP: Maryland Pink, MD   Patient coming from:  The patient is coming from home.  At baseline, pt is independent for most of ADL.        Chief Complaint: SOB  HPI: Andre SOBALVARRO Sr. is a 79 y.o. male with medical history significant of hypertension, hyperlipidemia, carotid artery stenosis (right endarterectomy), who presents with shortness of breath.  Patient states that he has been having intermittent shortness of breath for several weeks. He says that he had a cardiac stress test about 2 weeks ago and was told that the results were good. In the past 3 days, he has been having progressively worsening shortness of breath.  He has had some chest tightness earlier, but no chest pain currently. He states that he feels fluttering.  He has some mild dry cough intermittently.  Patient states that he had subjective fever last night, but did not measure body temperature.  No chills.  Patient has abdominal bloating feeling, but no active nausea, vomiting, diarrhea or abdominal pain.  No symptoms of UTI.  No unilateral weakness. He was found to have oxygen desaturation 86% on room air, BiPAP is started in ED.  ED Course: pt was found to have troponin 1053, INR 1.1, PTT 36, WBC 14.8, COVID-19 PCR negative, electrolyte renal function okay, temperature normal, blood pressure 141/81, tachycardia, oxygen saturation 96% on BiPAP.  Patient is admitted to progressive unit as inpatient.  Cardiology, Dr. [name] is consulted.   Review of Systems:   General: has subjective fevers, no chills, no body weight gain, has fatigue HEENT: no blurry vision, hearing changes or sore throat Respiratory: has dyspnea, coughing, no wheezing CV: no chest pain, no palpitations GI: no nausea, vomiting, abdominal pain, diarrhea, constipation GU: no dysuria, burning on urination, increased urinary frequency, hematuria   Ext: has trace leg edema Neuro: no unilateral weakness, numbness, or tingling, no vision change or hearing loss Skin: no rash, no skin tear. MSK: No muscle spasm, no deformity, no limitation of range of movement in spin Heme: No easy bruising.  Travel history: No recent long distant travel.  Allergy:  Allergies  Allergen Reactions  . Crestor [Rosuvastatin Calcium] Other (See Comments)    Muscle pain  . Rosuvastatin     Other reaction(s): Muscle Pain    Past Medical History:  Diagnosis Date  . Bursitis   . Elevated lipids   . Hypertension     Past Surgical History:  Procedure Laterality Date  . CAROTID ENDARTERECTOMY Right   . COLONOSCOPY WITH PROPOFOL N/A 08/20/2016   Procedure: COLONOSCOPY WITH PROPOFOL;  Surgeon: Manya Silvas, MD;  Location: [facility] ENDOSCOPY;  Service: Endoscopy;  Laterality: N/A;  . HERNIA REPAIR     1970  (Bilateral Inguinal Hernia)  . SHOULDER INJECTION    . THYROIDECTOMY, PARTIAL    . VASCULAR SURGERY     RT Carotid Endarterectomy    Social History:  reports that he has never smoked. He has never used smokeless tobacco. He reports that he does not drink alcohol or use drugs.  Family History:  Family History  Problem Relation Age of Onset  . Stroke Mother   . Cancer Father      Prior to Admission medications   Medication Sig Start Date End Date Taking? Authorizing Provider  amLODipine (NORVASC) 5 MG tablet Take 5 mg by mouth daily.    [provider]  aspirin EC 81 MG tablet Take 81 mg by mouth daily.    [provider]  tadalafil (CIALIS) 20 MG tablet TK 1 T PO QD PRN FOR ERECTILE DYSFUNCTION 12/27/16   [provider]    Physical Exam: Vitals:   03/05/20 0830 03/05/20 0900 03/05/20 1000 03/05/20 1030  BP: 135/88  130/78 (!) 143/77  Pulse: (!) 119  98 (!) 116  Resp:      Temp:      TempSrc:      SpO2: 98% 99% 98% 99%  Weight:      Height:       General: Not in acute distress HEENT:       Eyes: PERRL,  EOMI, no scleral icterus.       ENT: No discharge from the ears and nose, no pharynx injection, no tonsillar enlargement.        Neck: No JVD, no bruit, no mass felt. Heme: No neck lymph node enlargement. Cardiac: S1/S2, RRR, No murmurs, No gallops or rubs. Respiratory: Has fine crackles bilaterally  no wheezing, rhonchi or rubs. GI: Soft, nondistended, nontender, no rebound pain, no organomegaly, BS present. GU: No hematuria Ext: has trace leg edema bilaterally. 2+DP/PT pulse bilaterally. Musculoskeletal: No joint deformities, No joint redness or warmth, no limitation of ROM in spin. Skin: No rashes.  Neuro: Alert, oriented X3, cranial nerves II-XII grossly intact, moves all extremities normally.  Psych: Patient is not psychotic, no suicidal or hemocidal ideation.  Labs on Admission: I have personally reviewed following labs and imaging studies  CBC: Recent Labs  Lab 03/05/20 0537  WBC 14.8*  NEUTROABS 12.5*  HGB 14.1  HCT 40.9  MCV 87.6  PLT 123XX123   Basic Metabolic Panel: Recent Labs  Lab 03/05/20 0537  NA 139  K 4.2  CL 107  CO2 25  GLUCOSE 126*  BUN 28*  CREATININE 1.10  CALCIUM 8.7*   GFR: Estimated Creatinine Clearance: 57.1 mL/min (by C-G formula based on SCr of 1.1 mg/dL). Liver Function Tests: No results for input(s): AST, ALT, ALKPHOS, BILITOT, PROT, ALBUMIN in the last 168 hours. No results for input(s): LIPASE, AMYLASE in the last 168 hours. No results for input(s): AMMONIA in the last 168 hours. Coagulation Profile: Recent Labs  Lab 03/05/20 0535  INR 1.1   Cardiac Enzymes: No results for input(s): CKTOTAL, CKMB, CKMBINDEX, TROPONINI in the last 168 hours. BNP (last 3 results) No results for input(s): PROBNP in the last 8760 hours. HbA1C: No results for input(s): HGBA1C in the last 72 hours. CBG: No results for input(s): GLUCAP in the last 168 hours. Lipid Profile: No results for input(s): CHOL, HDL, LDLCALC, TRIG, CHOLHDL, LDLDIRECT in the  last 72 hours. Thyroid Function Tests: No results for input(s): TSH, T4TOTAL, FREET4, T3FREE, THYROIDAB in the last 72 hours. Anemia Panel: No results for input(s): VITAMINB12, FOLATE, FERRITIN, TIBC, IRON, RETICCTPCT in the last 72 hours. Urine analysis: No results found for: COLORURINE, APPEARANCEUR, LABSPEC, PHURINE, GLUCOSEU, HGBUR, BILIRUBINUR, KETONESUR, PROTEINUR, UROBILINOGEN, NITRITE, LEUKOCYTESUR Sepsis Labs: @LABRCNTIP (procalcitonin:4,lacticidven:4) ) Recent Results (from the past 240 hour(s))  Respiratory Panel by RT PCR (Flu A&B, Covid) - Nasopharyngeal Swab     Status: None   Collection Time: 03/05/20  5:49 AM   Specimen: Nasopharyngeal Swab  Result Value Ref Range Status   SARS Coronavirus 2 by RT PCR NEGATIVE NEGATIVE Final    Comment: (NOTE) SARS-CoV-2 target nucleic acids are NOT DETECTED. The SARS-CoV-2 RNA is generally detectable in upper respiratoy  specimens during the acute phase of infection. The lowest concentration of SARS-CoV-2 viral copies this assay can detect is 131 copies/mL. A negative result does not preclude SARS-Cov-2 infection and should not be used as the sole basis for treatment or other patient management decisions. A negative result may occur with  improper specimen collection/handling, submission of specimen other than nasopharyngeal swab, presence of viral mutation(s) within the areas targeted by this assay, and inadequate number of viral copies (<131 copies/mL). A negative result must be combined with clinical observations, patient history, and epidemiological information. The expected result is Negative. Fact Sheet for Patients:  PinkCheek.be Fact Sheet for Healthcare Providers:  GravelBags.it This test is not yet ap proved or cleared by the [place] FDA and  has been authorized for detection and/or diagnosis of SARS-CoV-2 by FDA under an Emergency Use Authorization (EUA). This  EUA will remain  in effect (meaning this test can be used) for the duration of the COVID-19 declaration under Section 564(b)(1) of the Act, 21 U.S.C. section 360bbb-3(b)(1), unless the authorization is terminated or revoked sooner.    Influenza A by PCR NEGATIVE NEGATIVE Final   Influenza B by PCR NEGATIVE NEGATIVE Final    Comment: (NOTE) The Xpert Xpress SARS-CoV-2/FLU/RSV assay is intended as an aid in  the diagnosis of influenza from Nasopharyngeal swab specimens and  should not be used as a sole basis for treatment. Nasal washings and  aspirates are unacceptable for Xpert Xpress SARS-CoV-2/FLU/RSV  testing. Fact Sheet for Patients: PinkCheek.be Fact Sheet for Healthcare Providers: GravelBags.it This test is not yet approved or cleared by the [place] FDA and  has been authorized for detection and/or diagnosis of SARS-CoV-2 by  FDA under an Emergency Use Authorization (EUA). This EUA will remain  in effect (meaning this test can be used) for the duration of the  Covid-19 declaration under Section 564(b)(1) of the Act, 21  U.S.C. section 360bbb-3(b)(1), unless the authorization is  terminated or revoked. Performed at [facility], [address] [place], [place] [address]      Radiological Exams on Admission: CT Angio Chest PE W/Cm &/Or Wo Cm  Result Date: 03/05/2020 CLINICAL DATA:  Shortness of breath EXAM: CT ANGIOGRAPHY CHEST WITH CONTRAST TECHNIQUE: Multidetector CT imaging of the chest was performed using the standard protocol during bolus administration of intravenous contrast. Multiplanar CT image reconstructions and MIPs were obtained to evaluate the vascular anatomy. CONTRAST:  40mL OMNIPAQUE IOHEXOL 350 MG/ML SOLN COMPARISON:  None. FINDINGS: Cardiovascular: Satisfactory opacification of the pulmonary arteries to the proximal segmental level. No evidence of pulmonary embolism. Normal heart size.  Reflux of contrast into the hepatic veins, which could reflect right heart dysfunction. Coronary artery and aortic calcifications. No pericardial effusion. Mediastinum/Nodes: Mildly prominent mediastinal and hilar nodes, likely reactive. Thyroid appears partially absent and not well evaluated due to artifact. Lungs/Pleura: Small bilateral pleural effusions and mild bibasilar atelectasis. Diffuse interlobular septal thickening. Mild peribronchial thickening. No pneumothorax. Upper Abdomen: Probable small hiatal hernia.  Small hepatic cysts. Musculoskeletal: No chest wall abnormality. Mild thoracic spine degenerative changes. No acute osseous abnormality. Review of the MIP images confirms the above findings. IMPRESSION: Suboptimal evaluation due to motion artifact. No evidence of acute pulmonary embolism. Interstitial pulmonary edema.  Small bilateral pleural effusions. Electronically Signed   By: Macy Mis M.D.   On: 03/05/2020 07:42     EKG: Independently reviewed.  Sinus tachycardia, QTC 442, ST depression in the lateral leads.  Assessment/Plan Principal Problem:   Acute  respiratory failure with hypoxia (HCC) Active Problems:   Essential hypertension, benign   NSTEMI (non-ST elevated myocardial infarction) (HCC)   Leukocytosis   HLD (hyperlipidemia)   Acute respiratory failure with hypoxia ([place]): CT angiogram is suboptimal study, but did not show evidence of PE, it showed interstitial pulmonary edema.  Patient has elevated BNP 659, clinically consistent with acute CHF.  Not sure which type of CHF.  No 2D echo on record.  Will get 2D echo for further evaluation. Dr. [name] of card is consulted. -Place to progressive unit for observation -Lasix 20 mg daily by IV -trend trop -2d echo -Daily weights -Obtain REDs Vest reading -f/u cards recommendations  Possible NSTEMI (non-ST elevated myocardial infarction) ([place]): Trop 1053.  Has ST depression in lateral leads - Trend Trop - IV heparin  started in ED - Repeat EKG in the am  - prn Morphine, and aspirin - pt is allergic to statin - Risk factor stratification: will check FLP and A1C  - check UDS - 2d echo  HTN:  -Continue home medications: Amlodipine -hydralazine prn  Leukocytosis: WBC 14.8.  No source of infection identified.  Patient reported subjective fever last night, but temperature is normal at 97.6 in ED. - blood culture -Follow-up urine analysis  HLD (hyperlipidemia): pt is allergic to statin -f/u FLP - May consider PCSK9 inhibitor or zetia if FLP is significantly abnormal   Inpatient status:  # Patient requires inpatient status due to high intensity of service, high risk for further deterioration and high frequency of surveillance required.  I certify that at the point of admission it is my clinical judgment that the patient will require inpatient hospital care spanning beyond 2 midnights from the point of admission.  . This patient has multiple chronic comorbidities including hypertension, hyperlipidemia, carotid artery stenosis (right endarterectomy) . Now patient has presenting with acute respiratory failure with hypoxia, non-STEMI, possible acute CHF . The worrisome physical exam findings include fine crackles on auscultation.  . The initial radiographic and laboratory data are worrisome because of CT angiogram showed interstitial pulmonary edema. . Current medical needs: please see my assessment and plan . Predictability of an adverse outcome (risk): Patient has multiple comorbidities as listed above. Now presents with acute respiratory failure with hypoxia, non-STEMI, possible acute CHF. Patient's presentation is highly complicated.  Patient is at high risk of deteriorating.  Will need to be treated in hospital for at least 2 days.          DVT ppx: on IV Heparin     Code Status: Full code Family Communication:    Yes, patient's wife at bed side Disposition Plan:  Anticipate discharge back to  previous home environment Consults called:  Dr. [name] of Card Admission status: Progressive bed as inpatient        Date of Service 03/05/2020    [place] [facility]   If 7PM-7AM, please contact night-coverage [email or web address] 03/05/2020, 11:08 AM

## 2020-03-05 NOTE — Progress Notes (Addendum)
[place] for heparin Indication: chest pain/ACS  Allergies  Allergen Reactions  . Crestor [Rosuvastatin Calcium] Other (See Comments)    Muscle pain  . Rosuvastatin     Other reaction(s): Muscle Pain    Patient Measurements: Height: 5\' 10"  (177.8 cm) Weight: 77.1 kg (170 lb) IBW/kg (Calculated) : 73 Heparin Dosing Weight: 77.1 kg  Vital Signs: Temp: 97.6 F (36.4 C) (04/10 0515) Temp Source: Oral (04/10 0515) BP: 138/89 (04/10 1330) Pulse Rate: 107 (04/10 1550)  Labs: Recent Labs    03/05/20 0535 03/05/20 0537 03/05/20 0901 03/05/20 1540  HGB  --  14.1  --   --   HCT  --  40.9  --   --   PLT  --  199  --   --   APTT 36  --   --   --   LABPROT 14.1  --   --   --   INR 1.1  --   --   --   HEPARINUNFRC  --   --   --  0.44  CREATININE  --  1.10  --   --   TROPONINIHS  --  1,053* 1,206* 1,630*    Estimated Creatinine Clearance: 57.1 mL/min (by C-G formula based on SCr of 1.1 mg/dL).   Medical History: Past Medical History:  Diagnosis Date  . Bursitis   . Elevated lipids   . Hypertension     Medications:  Scheduled:  . albuterol  2.5 mg Nebulization Q4H  . [START ON 03/06/2020] amLODipine  5 mg Oral Daily  . [START ON 03/06/2020] aspirin EC  81 mg Oral Daily  . dextromethorphan-guaiFENesin  1 tablet Oral BID  . furosemide  20 mg Intravenous Daily  . sodium chloride flush  3 mL Intravenous Q12H    Assessment: Patient arrives w/ SOB w/ on 86% RA placed on 2L O2, initial trop of 1053, EKG showing ST depression in aVL, and T wave abnormalities. No anticoagulation PTA. Patient is being started on heparin drip for management of NSTEMI. Baseline CBC WNL, aPTT/INR pending.  4/10 @ 1602 HL 0.44, therapeutic x1  Troponin 1053 > 1206 > 1630  Goal of Therapy:  Heparin level 0.3-0.7 units/ml Monitor platelets by anticoagulation protocol: Yes   Plan:  CONTINUE heparin rate at 1300 units/hr. Will check HL in 8 hours (@0000 )  for confirmation.  Will monitor daily CBC's and adjust per anti-Xa levels.  Kristeen Miss, PharmD Clinical Pharmacist 03/05/2020,4:32 PM

## 2020-03-06 ENCOUNTER — Inpatient Hospital Stay
Admit: 2020-03-06 | Discharge: 2020-03-06 | Disposition: A | Discharge status: Home / Self Care | Payer: Medicare Other | Attending: Internal Medicine | Admitting: Internal Medicine

## 2020-03-06 ENCOUNTER — Encounter: Payer: Self-pay | Admitting: Internal Medicine

## 2020-03-06 DIAGNOSIS — I214 Non-ST elevation (NSTEMI) myocardial infarction: Secondary | ICD-10-CM | POA: Diagnosis not present

## 2020-03-06 DIAGNOSIS — J9621 Acute and chronic respiratory failure with hypoxia: Secondary | ICD-10-CM

## 2020-03-06 DIAGNOSIS — J810 Acute pulmonary edema: Secondary | ICD-10-CM

## 2020-03-06 DIAGNOSIS — J9601 Acute respiratory failure with hypoxia: Secondary | ICD-10-CM

## 2020-03-06 LAB — CBC
HCT: 39.9 % (ref 39.0–52.0)
Hemoglobin: 13.3 g/dL (ref 13.0–17.0)
MCH: 29.8 pg (ref 26.0–34.0)
MCHC: 33.3 g/dL (ref 30.0–36.0)
MCV: 89.3 fL (ref 80.0–100.0)
Platelets: 203 10*3/uL (ref 150–400)
RBC: 4.47 MIL/uL (ref 4.22–5.81)
RDW: 12.9 % (ref 11.5–15.5)
WBC: 15.1 10*3/uL — ABNORMAL HIGH (ref 4.0–10.5)
nRBC: 0 % (ref 0.0–0.2)

## 2020-03-06 LAB — LIPID PANEL
Cholesterol: 144 mg/dL (ref 0–200)
HDL: 46 mg/dL (ref >40)
LDL Cholesterol: 86 mg/dL (ref 0–99)
Total CHOL/HDL Ratio: 3.1 RATIO
Triglycerides: 62 mg/dL (ref <150)
VLDL: 12 mg/dL (ref 0–40)

## 2020-03-06 LAB — ECHOCARDIOGRAM COMPLETE
Height: 70 in
Weight: 2720 oz

## 2020-03-06 LAB — HEPARIN LEVEL (UNFRACTIONATED)
Heparin Unfractionated: <0.1 IU/mL — ABNORMAL LOW (ref 0.30–0.70)
Heparin Unfractionated: 0.1 IU/mL — ABNORMAL LOW (ref 0.30–0.70)
Heparin Unfractionated: 0.31 IU/mL (ref 0.30–0.70)

## 2020-03-06 LAB — TROPONIN I (HIGH SENSITIVITY): Troponin I (High Sensitivity): 1501 ng/L (ref <18)

## 2020-03-06 MED ORDER — TEMAZEPAM 15 MG PO CAPS
15.0000 mg | ORAL_CAPSULE | Freq: Every evening | ORAL | Status: DC | PRN
  Administered 2020-03-06 – 2020-03-09 (×5): 15 mg via ORAL
  Filled 2020-03-06 (×10): qty 1

## 2020-03-06 MED ORDER — FUROSEMIDE 10 MG/ML IJ SOLN
60.0000 mg | Freq: Once | INTRAMUSCULAR | Status: AC
  Administered 2020-03-06: 60 mg via INTRAVENOUS

## 2020-03-06 MED ORDER — HEPARIN BOLUS VIA INFUSION
3000.0000 [IU] | Freq: Once | INTRAVENOUS | Status: AC
  Administered 2020-03-06: 3000 [IU] via INTRAVENOUS
  Filled 2020-03-06: qty 3000

## 2020-03-06 MED ORDER — METOPROLOL TARTRATE 25 MG PO TABS
25.0000 mg | ORAL_TABLET | Freq: Two times a day (BID) | ORAL | Status: DC
  Administered 2020-03-06 – 2020-03-07 (×3): 25 mg via ORAL
  Filled 2020-03-06 (×3): qty 1

## 2020-03-06 MED ORDER — TEMAZEPAM 15 MG PO CAPS: 15.0000 mg | ORAL_CAPSULE | Freq: Every evening | ORAL | Status: DC | PRN

## 2020-03-06 MED ORDER — ALBUTEROL SULFATE (2.5 MG/3ML) 0.083% IN NEBU: 2.5000 mg | INHALATION_SOLUTION | RESPIRATORY_TRACT | Status: DC | PRN

## 2020-03-06 NOTE — Progress Notes (Signed)
Assumed care of patient this AM. Patient appears to be winded/ SOB oxygen stauration at this time 78-86% on 6L per [place]. Provider contacted and RT at the bedside with plans to switch to a HF [place]. Now of high flow at 10L with an oxygen saturation of 91%. Heart rate continues to be elevated. Will given Lasix. Safety checks completed and call light placed within reach.

## 2020-03-06 NOTE — ED Notes (Addendum)
Heparin infusion noted in pump in room disconnected from pt. Bag is empty a this time. Heparin paused due to pt being transported (see emar)

## 2020-03-06 NOTE — Progress Notes (Signed)
PROGRESS NOTE  Andre DURRETT Sr. [id number] DOB: Apr 15, 1941 DOA: 03/05/2020 PCP: [place] Pink, MD   LOS: 1 day   Brief Narrative / Interim history: 79 year old male with HTN, HLD, carotid artery stenosis status post right endarterectomy who came to the hospital and was admitted on 03/05/2020 with intermittent shortness of breath for the last several weeks.  He also has been complaining some chest tightness prior to admission but none since being here.  He also reports fluttering.  He was found to have a significantly elevated troponin in the ED of 1053, cardiology was consulted and patient was placed on heparin infusion  Subjective / 24h Interval events: He is chest pain-free this morning, reports that overall he is feeling well.  Slightly anxious about upcoming cardiac catheterization.  Assessment & Plan: Principal Problem Non-STEMI -Cardiology consulted, patient was started on heparin infusion, continue -CT angiogram on admission was negative for PE but it did show interstitial pulmonary edema -Currently chest pain-free, continue to monitor -2D echo pending  Active Problems Acute hypoxic respiratory failure due to acute pulmonary edema -Likely in the setting of #1, 2D echo pending.  Suspect a degree of CHF.  Continue IV Lasix he seems to be responding well to that  Essential hypertension -On amlodipine, metoprolol, continue, blood pressure control  Hyperlipidemia -He is allergic to statins, may consider PCSK9 inhibitor or Zetia as an outpatient    Scheduled Meds: . amLODipine  5 mg Oral Daily  . aspirin EC  81 mg Oral Daily  . dextromethorphan-guaiFENesin  1 tablet Oral BID  . furosemide  20 mg Intravenous Daily  . metoprolol tartrate  25 mg Oral BID  . sodium chloride flush  3 mL Intravenous Q12H   Continuous Infusions: . heparin 1,550 Units/hr (03/06/20 1000)   PRN Meds:.acetaminophen, albuterol, hydrALAZINE, morphine injection, ondansetron (ZOFRAN) IV,  temazepam  DVT prophylaxis: heparin infusion Code Status: Full code Family Communication: d/w patient  Patient admitted from: home Anticipated d/c place: home Barriers to d/c: Pending cardiac catheterization tomorrow and cardiology clearance prior to discharge  Consultants:  Cardiology  Procedures:  None   Microbiology  None   Antimicrobials: None     Objective: Vitals:   03/06/20 0354 03/06/20 0418 03/06/20 0756 03/06/20 0953  BP:  122/77  112/80  Pulse: 93 90  92  Resp:  18  (!) 22  Temp:  98.4 F (36.9 C)  98.7 F (37.1 C)  TempSrc:    Oral  SpO2: 91% 93% 96% 98%  Weight:      Height:        Intake/Output Summary (Last 24 hours) at 03/06/2020 1053 Last data filed at 03/06/2020 0600 Gross per 24 hour  Intake 435.4 ml  Output 800 ml  Net -364.6 ml   Filed Weights   03/05/20 0516  Weight: 77.1 kg    Examination:  Constitutional: NAD Eyes: no scleral icterus ENMT: Mucous membranes are moist.  Neck: normal, supple Respiratory: clear to auscultation bilaterally, no wheezing, no crackles.  Cardiovascular: Regular rate and rhythm, no murmurs / rubs / gallops.  Abdomen: non distended, no tenderness. Bowel sounds positive.  Musculoskeletal: no clubbing / cyanosis.  Skin: no rashes Neurologic: non focal   Data Reviewed: I have independently reviewed following labs and imaging studies   CBC: Recent Labs  Lab 03/05/20 0537 03/06/20 0043  WBC 14.8* 15.1*  NEUTROABS 12.5*  --   HGB 14.1 13.3  HCT 40.9 39.9  MCV 87.6 89.3  PLT 199 203  Basic Metabolic Panel: Recent Labs  Lab 03/05/20 0537  NA 139  K 4.2  CL 107  CO2 25  GLUCOSE 126*  BUN 28*  CREATININE 1.10  CALCIUM 8.7*   Liver Function Tests: No results for input(s): AST, ALT, ALKPHOS, BILITOT, PROT, ALBUMIN in the last 168 hours. Coagulation Profile: Recent Labs  Lab 03/05/20 0535  INR 1.1   HbA1C: No results for input(s): HGBA1C in the last 72 hours. CBG: No results for  input(s): GLUCAP in the last 168 hours.  Recent Results (from the past 240 hour(s))  Respiratory Panel by RT PCR (Flu A&B, Covid) - Nasopharyngeal Swab     Status: None   Collection Time: 03/05/20  5:49 AM   Specimen: Nasopharyngeal Swab  Result Value Ref Range Status   SARS Coronavirus 2 by RT PCR NEGATIVE NEGATIVE Final    Comment: (NOTE) SARS-CoV-2 target nucleic acids are NOT DETECTED. The SARS-CoV-2 RNA is generally detectable in upper respiratoy specimens during the acute phase of infection. The lowest concentration of SARS-CoV-2 viral copies this assay can detect is 131 copies/mL. A negative result does not preclude SARS-Cov-2 infection and should not be used as the sole basis for treatment or other patient management decisions. A negative result may occur with  improper specimen collection/handling, submission of specimen other than nasopharyngeal swab, presence of viral mutation(s) within the areas targeted by this assay, and inadequate number of viral copies (<131 copies/mL). A negative result must be combined with clinical observations, patient history, and epidemiological information. The expected result is Negative. Fact Sheet for Patients:  PinkCheek.be Fact Sheet for Healthcare Providers:  GravelBags.it This test is not yet ap proved or cleared by the [place] FDA and  has been authorized for detection and/or diagnosis of SARS-CoV-2 by FDA under an Emergency Use Authorization (EUA). This EUA will remain  in effect (meaning this test can be used) for the duration of the COVID-19 declaration under Section 564(b)(1) of the Act, 21 U.S.C. section 360bbb-3(b)(1), unless the authorization is terminated or revoked sooner.    Influenza A by PCR NEGATIVE NEGATIVE Final   Influenza B by PCR NEGATIVE NEGATIVE Final    Comment: (NOTE) The Xpert Xpress SARS-CoV-2/FLU/RSV assay is intended as an aid in  the diagnosis  of influenza from Nasopharyngeal swab specimens and  should not be used as a sole basis for treatment. Nasal washings and  aspirates are unacceptable for Xpert Xpress SARS-CoV-2/FLU/RSV  testing. Fact Sheet for Patients: PinkCheek.be Fact Sheet for Healthcare Providers: GravelBags.it This test is not yet approved or cleared by the [place] FDA and  has been authorized for detection and/or diagnosis of SARS-CoV-2 by  FDA under an Emergency Use Authorization (EUA). This EUA will remain  in effect (meaning this test can be used) for the duration of the  Covid-19 declaration under Section 564(b)(1) of the Act, 21  U.S.C. section 360bbb-3(b)(1), unless the authorization is  terminated or revoked. Performed at [facility], [address], [place], [place] [address]      Radiology Studies: No results found.  Marzetta Board, MD, PhD [facility]  Between 7 am - 7 pm I am available, please contact me via Amion or Securechat  Between 7 pm - 7 am I am not available, please contact night coverage MD/APP via Amion

## 2020-03-06 NOTE — Progress Notes (Signed)
[facility] Encounter Note  Patient: Andre TREASURE Sr. / Admit Date: 03/05/2020 / Date of Encounter: 03/06/2020, 7:36 AM   Subjective: Patient feeling much better today.  No evidence of chest pain.  Patient rested well overnight.  Troponin elevation now up to 1630 consistent with non-ST elevation myocardial infarction.  Telemetry shows normal sinus rhythm.  Patient is remaining on appropriate medication management for non-ST elevation myocardial infarction including heparin aspirin and will consider addition of beta-blocker today  Review of Systems: Positive for: None Negative for: Vision change, hearing change, syncope, dizziness, nausea, vomiting,diarrhea, bloody stool, stomach pain, cough, congestion, diaphoresis, urinary frequency, urinary pain,skin lesions, skin rashes Others previously listed  Objective: Telemetry: Normal sinus rhythm Physical Exam: Blood pressure 122/77, pulse 90, temperature 98.4 F (36.9 C), resp. rate 18, height 5\' 10"  (1.778 m), weight 77.1 kg, SpO2 93 %. Body mass index is 24.39 kg/m. General: Well developed, well nourished, in no acute distress. Head: Normocephalic, atraumatic, sclera non-icteric, no xanthomas, nares are without discharge. Neck: No apparent masses Lungs: Normal respirations with no wheezes, no rhonchi, no rales , no crackles   Heart: Regular rate and rhythm, normal S1 S2, no murmur, no rub, no gallop, PMI is normal size and placement, carotid upstroke normal without bruit, jugular venous pressure normal Abdomen: Soft, non-tender, non-distended with normoactive bowel sounds. No hepatosplenomegaly. Abdominal aorta is normal size without bruit Extremities: No edema, no clubbing, no cyanosis, no ulcers,  Peripheral: 2+ radial, 2+ femoral, 2+ dorsal pedal pulses Neuro: Alert and oriented. Moves all extremities spontaneously. Psych:  Responds to questions appropriately with a normal affect.   Intake/Output Summary (Last 24  hours) at 03/06/2020 0736 Last data filed at 03/06/2020 0600 Gross per 24 hour  Intake 435.4 ml  Output 800 ml  Net -364.6 ml    Inpatient Medications:  . amLODipine  5 mg Oral Daily  . aspirin EC  81 mg Oral Daily  . dextromethorphan-guaiFENesin  1 tablet Oral BID  . furosemide  20 mg Intravenous Daily  . sodium chloride flush  3 mL Intravenous Q12H   Infusions:  . heparin 1,300 Units/hr (03/06/20 0600)    Labs: Recent Labs    03/05/20 0537  NA 139  K 4.2  CL 107  CO2 25  GLUCOSE 126*  BUN 28*  CREATININE 1.10  CALCIUM 8.7*   No results for input(s): AST, ALT, ALKPHOS, BILITOT, PROT, ALBUMIN in the last 72 hours. Recent Labs    03/05/20 0537 03/06/20 0043  WBC 14.8* 15.1*  NEUTROABS 12.5*  --   HGB 14.1 13.3  HCT 40.9 39.9  MCV 87.6 89.3  PLT 199 203   No results for input(s): CKTOTAL, CKMB, TROPONINI in the last 72 hours. Invalid input(s): POCBNP No results for input(s): HGBA1C in the last 72 hours.   Weights: Filed Weights   03/05/20 0516  Weight: 77.1 kg     Radiology/Studies:  CT Angio Chest PE W/Cm &/Or Wo Cm  Result Date: 03/05/2020 CLINICAL DATA:  Shortness of breath EXAM: CT ANGIOGRAPHY CHEST WITH CONTRAST TECHNIQUE: Multidetector CT imaging of the chest was performed using the standard protocol during bolus administration of intravenous contrast. Multiplanar CT image reconstructions and MIPs were obtained to evaluate the vascular anatomy. CONTRAST:  35mL OMNIPAQUE IOHEXOL 350 MG/ML SOLN COMPARISON:  None. FINDINGS: Cardiovascular: Satisfactory opacification of the pulmonary arteries to the proximal segmental level. No evidence of pulmonary embolism. Normal heart size. Reflux of contrast into the hepatic veins, which could reflect  right heart dysfunction. Coronary artery and aortic calcifications. No pericardial effusion. Mediastinum/Nodes: Mildly prominent mediastinal and hilar nodes, likely reactive. Thyroid appears partially absent and not well  evaluated due to artifact. Lungs/Pleura: Small bilateral pleural effusions and mild bibasilar atelectasis. Diffuse interlobular septal thickening. Mild peribronchial thickening. No pneumothorax. Upper Abdomen: Probable small hiatal hernia.  Small hepatic cysts. Musculoskeletal: No chest wall abnormality. Mild thoracic spine degenerative changes. No acute osseous abnormality. Review of the MIP images confirms the above findings. IMPRESSION: Suboptimal evaluation due to motion artifact. No evidence of acute pulmonary embolism. Interstitial pulmonary edema.  Small bilateral pleural effusions. Electronically Signed   By: Macy Mis M.D.   On: 03/05/2020 07:42     Assessment and Recommendation  79 y.o. male 79 year old male with hypertension hyperlipidemia peripheral vascular disease with acute non-ST elevation myocardial infarction 1.  Continue heparin aspirin and addition of beta-blocker today for further risk reduction of myocardial infarction 2.  Continue serial ECG and enzymes to assess extent of myocardial infarction 3.  Echocardiogram for LV systolic dysfunction valvular heart disease contributing to above symptoms 4.  Proceed to cardiac catheterization to assess coronary anatomy and further treatment thereof is necessary.  Patient understands risk and benefits of cardiac catheterization.  This includes the possibility of death stroke heart attack infection bleeding or blood clot.  He is at low risk for conscious sedation  Signed, Serafina Royals M.D. FACC

## 2020-03-06 NOTE — Progress Notes (Signed)
[place] for heparin Indication: chest pain/ACS  Allergies  Allergen Reactions  . Crestor [Rosuvastatin Calcium] Other (See Comments)    Muscle pain  . Rosuvastatin     Other reaction(s): Muscle Pain    Patient Measurements: Height: 5\' 10"  (177.8 cm) Weight: 77.1 kg (170 lb) IBW/kg (Calculated) : 73 Heparin Dosing Weight: 77.1 kg  Vital Signs: Temp: 98.3 F (36.8 C) (04/11 1419) Temp Source: Oral (04/11 1419) BP: 114/76 (04/11 1419) Pulse Rate: 90 (04/11 1419)  Labs: Recent Labs    03/05/20 0535 03/05/20 0537 03/05/20 0901 03/05/20 1540 03/05/20 1540 03/05/20 2005 03/06/20 0038 03/06/20 0043 03/06/20 0920 03/06/20 1803  HGB  --  14.1  --   --   --   --   --  13.3  --   --   HCT  --  40.9  --   --   --   --   --  39.9  --   --   PLT  --  199  --   --   --   --   --  203  --   --   APTT 36  --   --   --   --   --   --   --   --   --   LABPROT 14.1  --   --   --   --   --   --   --   --   --   INR 1.1  --   --   --   --   --   --   --   --   --   HEPARINUNFRC  --   --   --  0.44   < >  --  <0.10*  --  0.10* 0.31  CREATININE  --  1.10  --   --   --   --   --   --   --   --   TROPONINIHS  --  1,053*   < > 1,630*  --  1,510* 1,501*  --   --   --    < > = values in this interval not displayed.    Estimated Creatinine Clearance: 57.1 mL/min (by C-G formula based on SCr of 1.1 mg/dL).   Medical History: Past Medical History:  Diagnosis Date  . Bursitis   . Elevated lipids   . Hypertension   . SOB (shortness of breath)     Medications:  Scheduled:  . amLODipine  5 mg Oral Daily  . aspirin EC  81 mg Oral Daily  . dextromethorphan-guaiFENesin  1 tablet Oral BID  . furosemide  20 mg Intravenous Daily  . metoprolol tartrate  25 mg Oral BID  . sodium chloride flush  3 mL Intravenous Q12H    Assessment: Patient arrives w/ SOB w/ on 86% RA placed on 2L O2, initial trop of 1053, EKG showing ST depression in aVL, and T wave  abnormalities. No anticoagulation PTA. Patient is being started on heparin drip for management of NSTEMI. Baseline CBC WNL, aPTT/INR pending.  4/10 @ 1602 HL 0.44, therapeutic x1  - - - Heparin temporarily stopped when transitioned from ED to floor 4/11 @ 0038 HL < 0.10 - bolus given without changing rate due to previous therapeutic level 4/11 @ 0920 HL 0.10  Troponin 1053 > 1206 > 1630  Goal of Therapy:  Heparin level 0.3-0.7 units/ml Monitor platelets by anticoagulation protocol: Yes  Plan:  4/11 @ 1834 HL 0.31, therapeutic though on the lower end. Confirmed no heparin interruptions. Informed nursing of changes.   Will  INCREASE heparin rate to 1600 units/hr. Will check HL in 6 hours (@ 0100) following rate adjustment.  Will monitor daily CBC's and adjust per anti-Xa levels.   Rowland Lathe, PharmD Clinical Pharmacist 03/06/2020 6:51 PM

## 2020-03-06 NOTE — Progress Notes (Signed)
[place] for heparin Indication: chest pain/ACS  Allergies  Allergen Reactions  . Crestor [Rosuvastatin Calcium] Other (See Comments)    Muscle pain  . Rosuvastatin     Other reaction(s): Muscle Pain    Patient Measurements: Height: 5\' 10"  (177.8 cm) Weight: 77.1 kg (170 lb) IBW/kg (Calculated) : 73 Heparin Dosing Weight: 77.1 kg  Vital Signs: Temp: 98.7 F (37.1 C) (04/11 0953) Temp Source: Oral (04/11 0953) BP: 112/80 (04/11 0953) Pulse Rate: 92 (04/11 0953)  Labs: Recent Labs    03/05/20 0535 03/05/20 0537 03/05/20 0901 03/05/20 1540 03/05/20 2005 03/06/20 0038 03/06/20 0043 03/06/20 0920  HGB  --  14.1  --   --   --   --  13.3  --   HCT  --  40.9  --   --   --   --  39.9  --   PLT  --  199  --   --   --   --  203  --   APTT 36  --   --   --   --   --   --   --   LABPROT 14.1  --   --   --   --   --   --   --   INR 1.1  --   --   --   --   --   --   --   HEPARINUNFRC  --   --   --  0.44  --  <0.10*  --  0.10*  CREATININE  --  1.10  --   --   --   --   --   --   TROPONINIHS  --  1,053*   < > 1,630* 1,510* 1,501*  --   --    < > = values in this interval not displayed.    Estimated Creatinine Clearance: 57.1 mL/min (by C-G formula based on SCr of 1.1 mg/dL).   Medical History: Past Medical History:  Diagnosis Date  . Bursitis   . Elevated lipids   . Hypertension   . SOB (shortness of breath)     Medications:  Scheduled:  . amLODipine  5 mg Oral Daily  . aspirin EC  81 mg Oral Daily  . dextromethorphan-guaiFENesin  1 tablet Oral BID  . furosemide  20 mg Intravenous Daily  . heparin  3,000 Units Intravenous Once  . metoprolol tartrate  25 mg Oral BID  . sodium chloride flush  3 mL Intravenous Q12H    Assessment: Patient arrives w/ SOB w/ on 86% RA placed on 2L O2, initial trop of 1053, EKG showing ST depression in aVL, and T wave abnormalities. No anticoagulation PTA. Patient is being started on heparin drip  for management of NSTEMI. Baseline CBC WNL, aPTT/INR pending.  4/10 @ 1602 HL 0.44, therapeutic x1  - - - Heparin temporarily stopped when transitioned from ED to floor 4/11 @ 0038 HL < 0.10 - bolus given without changing rate due to previous therapeutic level 4/11 @ 0920 HL 0.10  Troponin 1053 > 1206 > 1630  Goal of Therapy:  Heparin level 0.3-0.7 units/ml Monitor platelets by anticoagulation protocol: Yes   Plan:  Will BOLUS 3000 units and INCREASE heparin rate to 1550 units/hr. Will check HL in 8 hours.  Will monitor daily CBC's and adjust per anti-Xa levels.  Discussed plan with nurse on floor 4/11 @ 1000 Cory Roughen)  Lu Duffel, PharmD, BCPS Clinical  Pharmacist 03/06/2020 9:59 AM

## 2020-03-06 NOTE — Progress Notes (Signed)
Patient arrived from the ER not on Heparin gtt.  A new  bag has been hung at this at this time. I have called Lexi RN in the ER to get clarification on when the bag was stopped in the ER. She has stated. Last bag was stopped 2358. Will advised pharmacy at this time.

## 2020-03-06 NOTE — Progress Notes (Signed)
EKG done this am showing. Sinus rhythm with premature supraventricular complexes. Possible inferior infarct, age undetermined. I have contacted Hassan Rowan with the results. Safety checks completed and call light placed within reach.

## 2020-03-06 NOTE — Progress Notes (Signed)
RN spoke with provider this morning about the patient's order for Red Vest reading attempted without success. Hospitalist aware and indicates the patient is okay without it as he is comfortable does not show any signs of distress. The patient is currently being monitored.

## 2020-03-06 NOTE — Progress Notes (Signed)
Assumed care of patient at this time

## 2020-03-06 NOTE — Progress Notes (Signed)
*  PRELIMINARY RESULTS* Echocardiogram 2D Echocardiogram has been performed.  Andre Murphy 03/06/2020, 12:17 PM

## 2020-03-06 NOTE — Progress Notes (Signed)
Red Vest reading attempted three times without success. Patient able to tolerated well however device continued to read Low quality. Provider aware at this time. Safety checks completed and call light placed within reach will continue to monitor.

## 2020-03-06 NOTE — Progress Notes (Signed)
PT Cancellation Note  Patient Details Name: Andre VARDEMAN Sr. MRN: [id number] DOB: Jul 20, 1941   Cancelled Treatment:    Reason Eval/Treat Not Completed: Patient not medically ready. Per Dr. Renne Crigler, hold PT eval until following cardiac catheterization (pending for 4/12).    Madilyn Hook 03/06/2020, 1:46 PM

## 2020-03-06 NOTE — Progress Notes (Signed)
Patient appears to be resting comfortably at this time.  Heart rate improved current HR 90 with and oxygen saturation of 91% on 10L per [place]. Will continue to monitor.

## 2020-03-06 NOTE — Progress Notes (Signed)
Patient arrived from ed. Patient with HR 120's o2 sats 88-89 on 6l [place] same on 10LHFNC. svn not given. BBS clear. Patient speaking well. Patient was weaned off bipap many hours ago and has done well. He states he does not want to go back on that unit because mask was smothering him. He states his breathing is good. Placed patient on HFNC for support. HR does need to be addressed in order to increase oxygenation. NP on her way to see patient

## 2020-03-06 NOTE — Progress Notes (Signed)
OT Cancellation Note  Patient Details Name: Andre PICKRON Sr. MRN: [id number] DOB: 26-Dec-1940   Cancelled Treatment:    Reason Eval/Treat Not Completed: Patient not medically ready Per conversation c PT, Dr. Renne Crigler requesting to hold evaluation until cardiac catheterization (pending for 4/12). Will follow acutely and initiate services as appropriate.   Dessie Coma, M.S. OTR/L  03/06/20, 1:57 PM

## 2020-03-07 ENCOUNTER — Encounter: Payer: Self-pay | Admitting: Cardiology

## 2020-03-07 ENCOUNTER — Encounter
Admission: EM | Disposition: A | Discharge status: Home / Self Care | Payer: Self-pay | Source: Home / Self Care | Attending: Internal Medicine

## 2020-03-07 ENCOUNTER — Inpatient Hospital Stay: Payer: Medicare Other

## 2020-03-07 DIAGNOSIS — I5021 Acute systolic (congestive) heart failure: Secondary | ICD-10-CM

## 2020-03-07 DIAGNOSIS — I7121 Aneurysm of the ascending aorta, without rupture: Secondary | ICD-10-CM

## 2020-03-07 DIAGNOSIS — J9601 Acute respiratory failure with hypoxia: Secondary | ICD-10-CM | POA: Diagnosis not present

## 2020-03-07 DIAGNOSIS — I214 Non-ST elevation (NSTEMI) myocardial infarction: Secondary | ICD-10-CM | POA: Diagnosis not present

## 2020-03-07 HISTORY — PX: LEFT HEART CATH AND CORONARY ANGIOGRAPHY: CATH118249

## 2020-03-07 HISTORY — DX: Aneurysm of the ascending aorta, without rupture: I71.21

## 2020-03-07 LAB — HEPARIN LEVEL (UNFRACTIONATED)
Heparin Unfractionated: 0.3 IU/mL (ref 0.30–0.70)
Heparin Unfractionated: 0.21 IU/mL — ABNORMAL LOW (ref 0.30–0.70)

## 2020-03-07 LAB — CBC
HCT: 37.2 % — ABNORMAL LOW (ref 39.0–52.0)
Hemoglobin: 12.5 g/dL — ABNORMAL LOW (ref 13.0–17.0)
MCH: 30 pg (ref 26.0–34.0)
MCHC: 33.6 g/dL (ref 30.0–36.0)
MCV: 89.2 fL (ref 80.0–100.0)
Platelets: 208 10*3/uL (ref 150–400)
RBC: 4.17 MIL/uL — ABNORMAL LOW (ref 4.22–5.81)
RDW: 12.8 % (ref 11.5–15.5)
WBC: 11.4 10*3/uL — ABNORMAL HIGH (ref 4.0–10.5)
nRBC: 0 % (ref 0.0–0.2)

## 2020-03-07 LAB — HEMOGLOBIN A1C
Hgb A1c MFr Bld: 5.4 % (ref 4.8–5.6)
Mean Plasma Glucose: 108 mg/dL

## 2020-03-07 IMAGING — DX DG CHEST 1V PORT
1 series · 1 of 1 positions shown · non-contrast
Comparison: CT 1 day prior

CLINICAL DATA: CHF. Hypertension.

EXAM:
PORTABLE CHEST 1 VIEW

[chest ap]
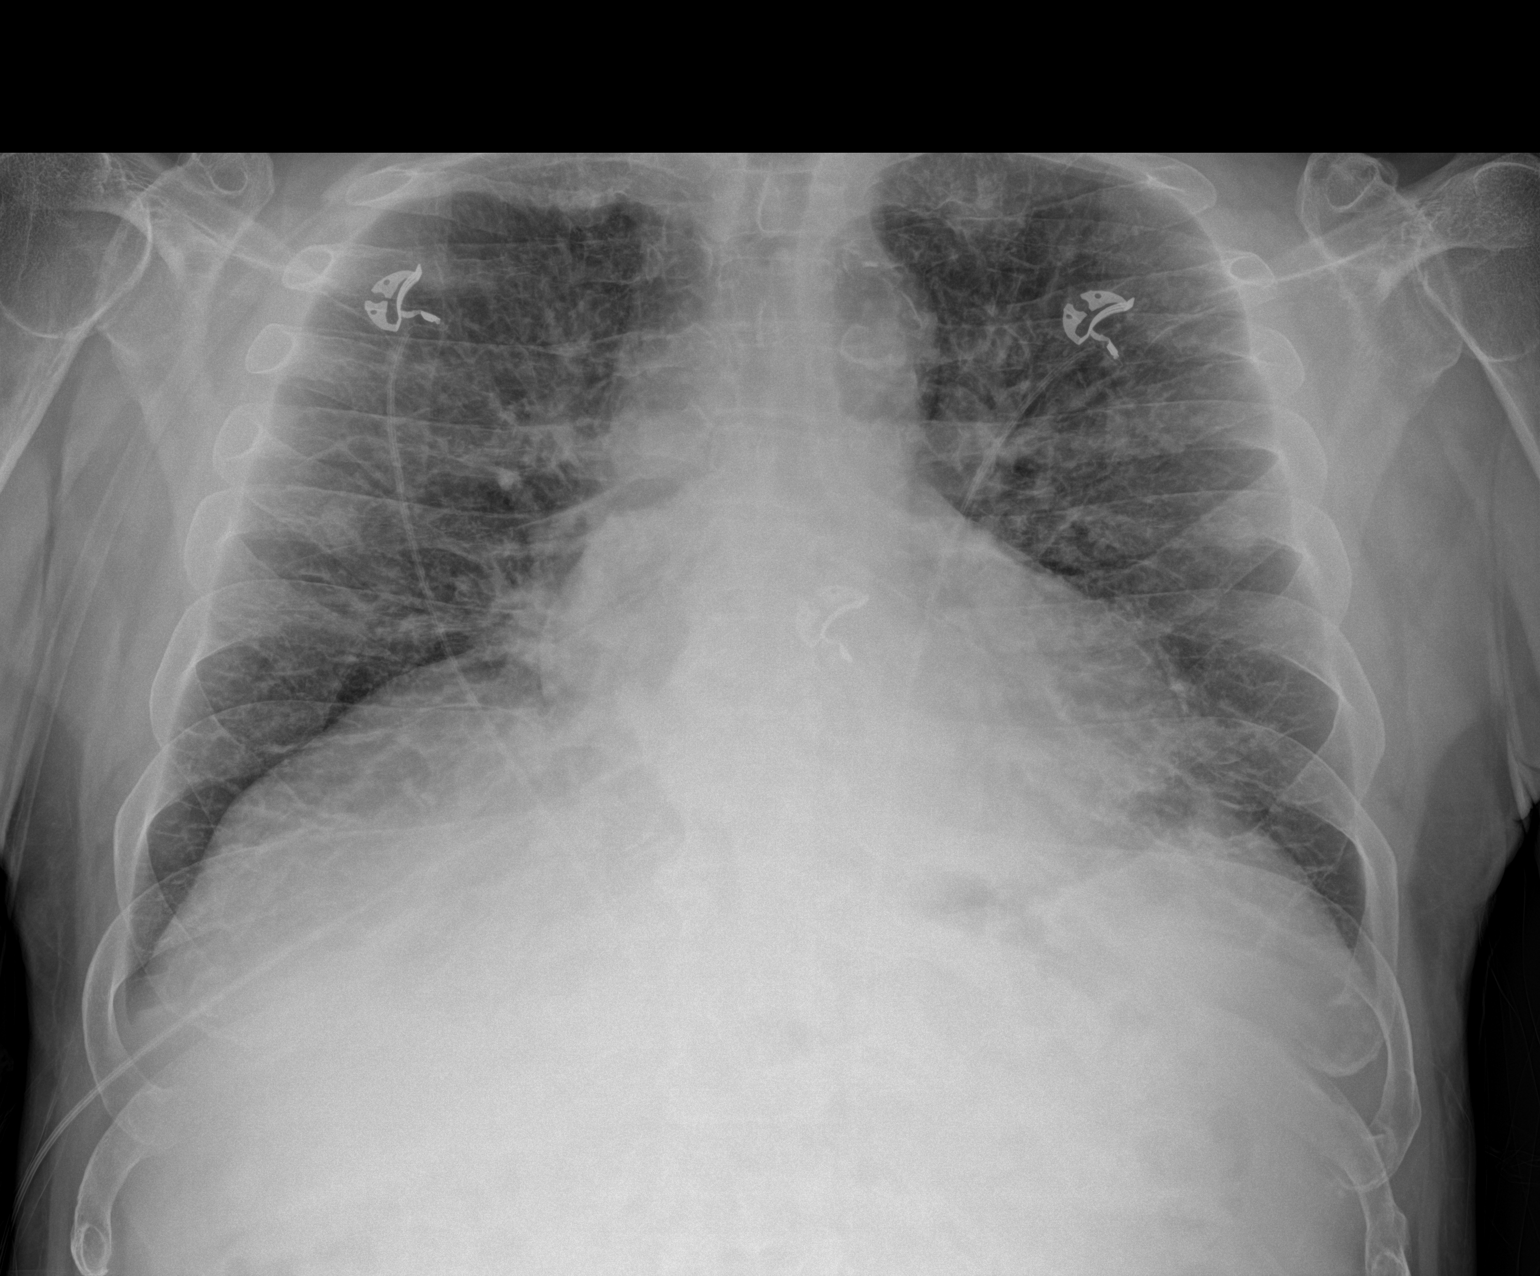

[1 of 1 positions shown; findings below may reference images not displayed]

FINDINGS: Midline trachea. Mild cardiomegaly. The previously described
bilateral pleural effusions are not readily apparent. Mild pulmonary
venous congestion, with improved interstitial edema. No lobar
consolidation. Numerous leads and wires project over the chest.
IMPRESSION: Cardiomegaly with mild pulmonary venous congestion, improved.

## 2020-03-07 SURGERY — LEFT HEART CATH AND CORONARY ANGIOGRAPHY
Anesthesia: Moderate Sedation

## 2020-03-07 MED ORDER — HEPARIN (PORCINE) 25000 UT/250ML-% IV SOLN
1950.0000 [IU]/h | INTRAVENOUS | Status: DC
  Administered 2020-03-07: 1750 [IU]/h via INTRAVENOUS
  Administered 2020-03-08: 1950 [IU]/h via INTRAVENOUS
  Filled 2020-03-07 (×2): qty 250

## 2020-03-07 MED ORDER — IOHEXOL 300 MG/ML  SOLN
INTRAMUSCULAR | Status: DC | PRN
  Administered 2020-03-07: 14:00:00 120 mL

## 2020-03-07 MED ORDER — ASPIRIN 81 MG PO CHEW: 81.0000 mg | CHEWABLE_TABLET | ORAL | Status: DC

## 2020-03-07 MED ORDER — HEPARIN BOLUS VIA INFUSION
1200.0000 [IU] | Freq: Once | INTRAVENOUS | Status: AC
  Administered 2020-03-07: 1200 [IU] via INTRAVENOUS
  Filled 2020-03-07: qty 1200

## 2020-03-07 MED ORDER — SODIUM CHLORIDE 0.9 % WEIGHT BASED INFUSION: 3.0000 mL/kg/h | INTRAVENOUS | Status: DC

## 2020-03-07 MED ORDER — HEPARIN (PORCINE) IN NACL 1000-0.9 UT/500ML-% IV SOLN
INTRAVENOUS | Status: DC | PRN
  Administered 2020-03-07: 500 mL

## 2020-03-07 MED ORDER — HEPARIN (PORCINE) 25000 UT/250ML-% IV SOLN: 1750.0000 [IU]/h | INTRAVENOUS | Status: DC

## 2020-03-07 MED ORDER — ONDANSETRON HCL 4 MG/2ML IJ SOLN: 4.0000 mg | Freq: Four times a day (QID) | INTRAMUSCULAR | Status: DC | PRN

## 2020-03-07 MED ORDER — SODIUM CHLORIDE 0.9% FLUSH: 3.0000 mL | INTRAVENOUS | Status: DC | PRN

## 2020-03-07 MED ORDER — SODIUM CHLORIDE 0.9 % WEIGHT BASED INFUSION
1.0000 mL/kg/h | INTRAVENOUS | Status: DC
  Administered 2020-03-07: 1 mL/kg/h via INTRAVENOUS

## 2020-03-07 MED ORDER — HYDRALAZINE HCL 20 MG/ML IJ SOLN: 10.0000 mg | INTRAMUSCULAR | Status: AC | PRN

## 2020-03-07 MED ORDER — ACETAMINOPHEN 325 MG PO TABS: 650.0000 mg | ORAL_TABLET | ORAL | Status: DC | PRN

## 2020-03-07 MED ORDER — FUROSEMIDE 10 MG/ML IJ SOLN
40.0000 mg | Freq: Two times a day (BID) | INTRAMUSCULAR | Status: DC
  Administered 2020-03-07 – 2020-03-10 (×7): 40 mg via INTRAVENOUS
  Filled 2020-03-07 (×7): qty 4

## 2020-03-07 MED ORDER — LABETALOL HCL 5 MG/ML IV SOLN: 10.0000 mg | INTRAVENOUS | Status: AC | PRN

## 2020-03-07 MED ORDER — SODIUM CHLORIDE 0.9 % IV SOLN
250.0000 mL | INTRAVENOUS | Status: DC | PRN
  Administered 2020-03-07: 250 mL via INTRAVENOUS

## 2020-03-07 MED ORDER — SODIUM CHLORIDE 0.9 % WEIGHT BASED INFUSION: 1.0000 mL/kg/h | INTRAVENOUS | Status: DC

## 2020-03-07 MED ORDER — METOPROLOL TARTRATE 50 MG PO TABS
50.0000 mg | ORAL_TABLET | Freq: Two times a day (BID) | ORAL | Status: DC
  Administered 2020-03-07 – 2020-03-10 (×6): 50 mg via ORAL
  Filled 2020-03-07 (×7): qty 1

## 2020-03-07 SURGICAL SUPPLY — 12 items
CATH INFINITI 5FR ANG PIGTAIL (CATHETERS) ×2 IMPLANT
CATH INFINITI 5FR JL4 (CATHETERS) ×2 IMPLANT
CATH INFINITI 5FR JL5 (CATHETERS) ×2 IMPLANT
CATH INFINITI JR4 5F (CATHETERS) ×2 IMPLANT
DEVICE CLOSURE MYNXGRIP 5F (Vascular Products) ×2 IMPLANT
KIT MANI 3VAL PERCEP (MISCELLANEOUS) ×3 IMPLANT
NDL PERC 18GX7CM (NEEDLE) IMPLANT
NEEDLE PERC 18GX7CM (NEEDLE) ×3 IMPLANT
PACK CARDIAC CATH (CUSTOM PROCEDURE TRAY) ×3 IMPLANT
SHEATH AVANTI 5FR X 11CM (SHEATH) ×2 IMPLANT
WIRE EMERALD ST .035X150CM (WIRE) ×2 IMPLANT
WIRE GUIDERIGHT .035X150 (WIRE) ×2 IMPLANT

## 2020-03-07 NOTE — Progress Notes (Signed)
PROGRESS NOTE    Andre FANIEL Sr.  OLM:[id number] DOB: 05-13-1941 DOA: 03/05/2020 PCP: [place] Pink, MD (Confirm with patient/family/NH records and if not entered, this HAS to be entered at [facility] point of entry. "No PCP" if truly none.)   Brief Narrative: (Start on day 1 of progress note - keep it brief and live)  Patient is a 79 year old male with history of hypertension, dyslipidemia, carotid artery stenosis status post right endarterectomy, who initially admitted to the hospital on 4/10 with complaints of intermittent shortness of breath.  He had an elevated troponin.  He was diagnosed with non-ST elevation myocardial infarction.  Cardiac cath was performed on 4/12, multivessel disease.  Patient also has evidence of a congestive heart failure, is currently receiving IV Lasix.  Assessment & Plan: #1.  Acute hypoxemic restaurant failure secondary to pulmonary edema.  Condition had improved.  Continue Lasix. 2.  Non-STEMI.  Status post cardiac cath.  Patient has multivessel disease.  Patient will need coronary bypass grafting in the near future.  Continue aspirin and beta-blocker. 3.  Acute systolic congestive heart failure secondary to non-STEMI.  Patient had ejection fraction of 35% on heart cath.  Patient currently receiving 10 hours of IV fluids post catheterization, Lasix is started IV twice a day. 4.  Essential hypertension.  Continue home medicines.   DVT prophylaxis: on SQ heparin Family Communication:  Discussed with the patient, all questions answered. Disposition Plan:  . Patient came from: Home            . Anticipated d/c place: Home . Barriers to d/c OR conditions which need to be met to effect a safe d/c:   Consultants:   Cardiology  Procedures: Heart catheterization Antimicrobials: None   Subjective: Patient feels better today.  Short of breath has improved.  No chest pain.  No nausea vomiting.  Good appetite.  Objective: Vitals:   03/07/20 1420 03/07/20  1425 03/07/20 1500 03/07/20 1600  BP:  123/76 121/74   Pulse: 82 92 86   Resp: 16 20 20    Temp:   98.4 F (36.9 C)   TempSrc:   Oral   SpO2: 97% 99% 96% 94%  Weight:      Height:        Intake/Output Summary (Last 24 hours) at 03/07/2020 1841 Last data filed at 03/07/2020 1237 Gross per 24 hour  Intake 214.09 ml  Output 600 ml  Net -385.91 ml   Filed Weights   03/05/20 0516  Weight: 77.1 kg    Examination:  General exam: Appears calm and comfortable  Respiratory system:  afew crackles in the lower base to auscultation. Respiratory effort normal. Cardiovascular system: S1 & S2 heard, RRR. No JVD, murmurs, rubs, gallops or clicks. No pedal edema. Gastrointestinal system: Abdomen is nondistended, soft and nontender. No organomegaly or masses felt. Normal bowel sounds heard. Central nervous system: Alert and oriented. No focal neurological deficits. Extremities: Symmetric 5 x 5 power. Skin: No rashes, lesions or ulcers Psychiatry: Judgement and insight appear normal. Mood & affect appropriate.     Data Reviewed: I have personally reviewed following labs and imaging studies  CBC: Recent Labs  Lab 03/05/20 0537 03/06/20 0043 03/07/20 0506  WBC 14.8* 15.1* 11.4*  NEUTROABS 12.5*  --   --   HGB 14.1 13.3 12.5*  HCT 40.9 39.9 37.2*  MCV 87.6 89.3 89.2  PLT 199 203 010   Basic Metabolic Panel: Recent Labs  Lab 03/05/20 0537  NA 139  K 4.2  CL 107  CO2 25  GLUCOSE 126*  BUN 28*  CREATININE 1.10  CALCIUM 8.7*   GFR: Estimated Creatinine Clearance: 57.1 mL/min (by C-G formula based on SCr of 1.1 mg/dL). Liver Function Tests: No results for input(s): AST, ALT, ALKPHOS, BILITOT, PROT, ALBUMIN in the last 168 hours. No results for input(s): LIPASE, AMYLASE in the last 168 hours. No results for input(s): AMMONIA in the last 168 hours. Coagulation Profile: Recent Labs  Lab 03/05/20 0535  INR 1.1   Cardiac Enzymes: No results for input(s): CKTOTAL, CKMB,  CKMBINDEX, TROPONINI in the last 168 hours. BNP (last 3 results) No results for input(s): PROBNP in the last 8760 hours. HbA1C: Recent Labs    03/06/20 0043  HGBA1C 5.4   CBG: No results for input(s): GLUCAP in the last 168 hours. Lipid Profile: Recent Labs    03/06/20 0043  CHOL 144  HDL 46  LDLCALC 86  TRIG 62  CHOLHDL 3.1   Thyroid Function Tests: No results for input(s): TSH, T4TOTAL, FREET4, T3FREE, THYROIDAB in the last 72 hours. Anemia Panel: No results for input(s): VITAMINB12, FOLATE, FERRITIN, TIBC, IRON, RETICCTPCT in the last 72 hours. Sepsis Labs: No results for input(s): PROCALCITON, LATICACIDVEN in the last 168 hours.  Recent Results (from the past 240 hour(s))  Respiratory Panel by RT PCR (Flu A&B, Covid) - Nasopharyngeal Swab     Status: None   Collection Time: 03/05/20  5:49 AM   Specimen: Nasopharyngeal Swab  Result Value Ref Range Status   SARS Coronavirus 2 by RT PCR NEGATIVE NEGATIVE Final    Comment: (NOTE) SARS-CoV-2 target nucleic acids are NOT DETECTED. The SARS-CoV-2 RNA is generally detectable in upper respiratoy specimens during the acute phase of infection. The lowest concentration of SARS-CoV-2 viral copies this assay can detect is 131 copies/mL. A negative result does not preclude SARS-Cov-2 infection and should not be used as the sole basis for treatment or other patient management decisions. A negative result may occur with  improper specimen collection/handling, submission of specimen other than nasopharyngeal swab, presence of viral mutation(s) within the areas targeted by this assay, and inadequate number of viral copies (<131 copies/mL). A negative result must be combined with clinical observations, patient history, and epidemiological information. The expected result is Negative. Fact Sheet for Patients:  PinkCheek.be Fact Sheet for Healthcare Providers:   GravelBags.it This test is not yet ap proved or cleared by the [place] FDA and  has been authorized for detection and/or diagnosis of SARS-CoV-2 by FDA under an Emergency Use Authorization (EUA). This EUA will remain  in effect (meaning this test can be used) for the duration of the COVID-19 declaration under Section 564(b)(1) of the Act, 21 U.S.C. section 360bbb-3(b)(1), unless the authorization is terminated or revoked sooner.    Influenza A by PCR NEGATIVE NEGATIVE Final   Influenza B by PCR NEGATIVE NEGATIVE Final    Comment: (NOTE) The Xpert Xpress SARS-CoV-2/FLU/RSV assay is intended as an aid in  the diagnosis of influenza from Nasopharyngeal swab specimens and  should not be used as a sole basis for treatment. Nasal washings and  aspirates are unacceptable for Xpert Xpress SARS-CoV-2/FLU/RSV  testing. Fact Sheet for Patients: PinkCheek.be Fact Sheet for Healthcare Providers: GravelBags.it This test is not yet approved or cleared by the [place] FDA and  has been authorized for detection and/or diagnosis of SARS-CoV-2 by  FDA under an Emergency Use Authorization (EUA). This EUA will remain  in effect (meaning this test can be  used) for the duration of the  Covid-19 declaration under Section 564(b)(1) of the Act, 21  U.S.C. section 360bbb-3(b)(1), unless the authorization is  terminated or revoked. Performed at [facility], [address], [place], [place] [address]          Radiology Studies: CARDIAC CATHETERIZATION  Result Date: 03/07/2020  Ost RCA to Prox RCA lesion is 100% stenosed.  Dist RCA lesion is 100% stenosed.  Prox Cx to Mid Cx lesion is 99% stenosed.  Mid Cx lesion is 85% stenosed.  Prox Cx lesion is 85% stenosed.  1st Mrg lesion is 90% stenosed.  Prox LAD lesion is 50% stenosed.  1st Diag lesion is 90% stenosed.  Mid LAD-1 lesion is 85%  stenosed.  Mid LAD-2 lesion is 85% stenosed.  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 with multiple coronary atherosclerosis of multiple areas Unable to cross valve due to significant stenoses and dilation of aortic root Assessment Acute non-ST elevation myocardial infarction Moderate to severe aortic valve stenosis 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 Hypertension. EXAM: PORTABLE CHEST 1 VIEW COMPARISON:  CT 1 day prior FINDINGS: Midline trachea. Mild cardiomegaly. The previously described bilateral pleural effusions are not readily apparent. Mild pulmonary venous congestion, with improved interstitial edema. No lobar consolidation. Numerous leads and wires project over  the chest. IMPRESSION: Cardiomegaly with mild pulmonary venous congestion, improved. Electronically Signed   By: KAbigail MiyamotoM.D.   On: 03/07/2020 08:21   ECHOCARDIOGRAM COMPLETE  Result Date: 03/06/2020    ECHOCARDIOGRAM REPORT   Patient Name:   Andre RUPPEDate of Exam: 03/06/2020 Medical Rec #:  [id number]     Height:       70.0 in Accession #:    [id number]    Weight:       170.0 lb Date of Birth:  1Apr 21, 1942     BSA:          1.948 m Patient Age:    89years       BP:           122/77 mmHg Patient Gender: M              HR:           87 bpm. Exam Location:  [facility] Procedure: 2D Echo, Color Doppler and Cardiac Doppler Indications:     R07.9 Chest Pain  History:         Patient has  no prior history of Echocardiogram examinations.                  Signs/Symptoms:Shortness of Breath and Chest Pain; Risk                  Factors:Hypertension.  Sonographer:     Charmayne Sheer RDCS (AE) Referring Phys:  [id number] Soledad Gerlach NIU Diagnosing Phys: Serafina Royals MD  Sonographer Comments: Suboptimal parasternal window. IMPRESSIONS  1. Left ventricular ejection fraction, by estimation, is 35 to 40%. The left ventricle has moderately decreased function. The left ventricle demonstrates regional wall motion abnormalities (see scoring diagram/findings for description). Left ventricular  diastolic parameters were normal.  2. Right ventricular systolic function is normal. The right ventricular size is normal.  3. Left atrial size was mildly dilated.  4. The mitral valve is normal in structure. Mild mitral valve regurgitation.  5. The aortic valve is normal in structure. Aortic valve regurgitation is trivial. Moderate aortic valve stenosis. FINDINGS  Left Ventricle: Left ventricular ejection fraction, by estimation, is 35 to 40%. The left ventricle has moderately decreased function. The left ventricle demonstrates regional wall motion abnormalities. Moderate hypokinesis of the left ventricular, mid-apical inferoseptal wall and  anteroseptal wall. The left ventricular internal cavity size was normal in size. There is borderline left ventricular hypertrophy. Left ventricular diastolic parameters were normal. Right Ventricle: The right ventricular size is normal. No increase in right ventricular wall thickness. Right ventricular systolic function is normal. Left Atrium: Left atrial size was mildly dilated. Right Atrium: Right atrial size was normal in size. Pericardium: There is no evidence of pericardial effusion. Mitral Valve: The mitral valve is normal in structure. Mild mitral valve regurgitation. MV peak gradient, 5.1 mmHg. The mean mitral valve gradient is 2.0 mmHg. Tricuspid Valve: The tricuspid valve is normal in structure. Tricuspid valve regurgitation is mild. Aortic Valve: The aortic valve is normal in structure. Aortic valve regurgitation is trivial. Moderate aortic stenosis is present. Aortic valve mean gradient measures 19.0 mmHg. Aortic valve peak gradient measures 33.3 mmHg. Aortic valve area, by VTI measures 0.68 cm. Pulmonic Valve: The pulmonic valve was grossly normal. Pulmonic valve regurgitation is not visualized. Aorta: The aortic root and ascending aorta are structurally normal, with no evidence of dilitation. IAS/Shunts: No atrial level shunt detected by color flow Doppler.  LEFT VENTRICLE PLAX 2D LVIDd:         3.87 cm      Diastology LVIDs:         3.17 cm      LV e' lateral:   7.18 cm/s LV PW:         0.93 cm      LV E/e' lateral: 14.6 LV IVS:        0.87 cm      LV e' medial:    5.77 cm/s LVOT diam:     2.20 cm      LV E/e' medial:  18.2 LV SV:         36 LV SV Index:   18 LVOT Area:     3.80 cm  LV Volumes (MOD) LV vol d, MOD A2C: 116.0 ml LV vol d, MOD A4C: 108.0 ml LV vol s, MOD A2C: 88.3 ml LV vol s, MOD A4C: 79.7 ml LV SV MOD A2C:     27.7 ml LV SV MOD A4C:     108.0 ml LV SV MOD BP:      32.2 ml LEFT ATRIUM  Index       RIGHT ATRIUM           Index LA diam:      4.20 cm 2.16 cm/m  RA Area:      14.60 cm LA Vol (A4C): 48.0 ml 24.64 ml/m RA Volume:   38.10 ml  19.56 ml/m  AORTIC VALVE                    PULMONIC VALVE AV Area (Vmax):    0.72 cm     PV Vmax:       0.67 m/s AV Area (Vmean):   0.70 cm     PV Vmean:      45.800 cm/s AV Area (VTI):     0.68 cm     PV VTI:        0.115 m AV Vmax:           288.67 cm/s  PV Peak grad:  1.8 mmHg AV Vmean:          198.667 cm/s PV Mean grad:  1.0 mmHg AV VTI:            0.524 m AV Peak Grad:      33.3 mmHg AV Mean Grad:      19.0 mmHg LVOT Vmax:         54.70 cm/s LVOT Vmean:        36.700 cm/s LVOT VTI:          0.094 m LVOT/AV VTI ratio: 0.18  AORTA Ao Root diam: 3.40 cm MITRAL VALVE MV Area (PHT): 5.05 cm     SHUNTS MV Peak grad:  5.1 mmHg     Systemic VTI:  0.09 m MV Mean grad:  2.0 mmHg     Systemic Diam: 2.20 cm MV Vmax:       1.13 m/s MV Vmean:      63.5 cm/s MV Decel Time: 150 msec MV E velocity: 105.17 cm/s MV A velocity: 56.90 cm/s MV E/A ratio:  1.85 Serafina Royals MD Electronically signed by Serafina Royals MD Signature Date/Time: 03/06/2020/7:09:42 PM    Final         Scheduled Meds: . aspirin EC  81 mg Oral Daily  . dextromethorphan-guaiFENesin  1 tablet Oral BID  . furosemide  40 mg Intravenous BID  . metoprolol tartrate  50 mg Oral BID  . sodium chloride flush  3 mL Intravenous Q12H   Continuous Infusions: . sodium chloride 1 mL/kg/hr (03/07/20 1751)  . heparin 1,750 Units/hr (03/07/20 1745)     LOS: 2 days    Time spent: 25 minutes    Sharen Hones, MD [facility]   To contact the attending provider between 7A-7P or the covering provider during after hours 7P-7A, please log into the web site [email or web address] and access using universal [place] password for that web site. If you do not have the password, please call the hospital operator.  03/07/2020, 6:41 PM

## 2020-03-07 NOTE — Progress Notes (Deleted)
[place] for heparin Indication: chest pain/ACS  Allergies  Allergen Reactions  . Crestor [Rosuvastatin Calcium] Other (See Comments)    Muscle pain  . Rosuvastatin     Other reaction(s): Muscle Pain    Patient Measurements: Height: 5\' 10"  (177.8 cm) Weight: 77.1 kg (170 lb) IBW/kg (Calculated) : 73 Heparin Dosing Weight: 77.1 kg  Vital Signs: Temp: 98.4 F (36.9 C) (04/12 1500) Temp Source: Oral (04/12 1500) BP: 121/74 (04/12 1500) Pulse Rate: 86 (04/12 1500)  Labs: Recent Labs    03/05/20 0535 03/05/20 0537 03/05/20 0537 03/05/20 0901 03/05/20 1540 03/05/20 1540 03/05/20 2005 03/06/20 0038 03/06/20 0043 03/06/20 0920 03/06/20 1803 03/07/20 0058 03/07/20 0506  HGB  --  14.1   < >  --   --   --   --   --  13.3  --   --   --  12.5*  HCT  --  40.9  --   --   --   --   --   --  39.9  --   --   --  37.2*  PLT  --  199  --   --   --   --   --   --  203  --   --   --  208  APTT 36  --   --   --   --   --   --   --   --   --   --   --   --   LABPROT 14.1  --   --   --   --   --   --   --   --   --   --   --   --   INR 1.1  --   --   --   --   --   --   --   --   --   --   --   --   HEPARINUNFRC  --   --   --   --  0.44   < >  --  <0.10*  --    < > 0.31 0.30 0.21*  CREATININE  --  1.10  --   --   --   --   --   --   --   --   --   --   --   TROPONINIHS  --  1,053*  --    < > 1,630*  --  1,510* 1,501*  --   --   --   --   --    < > = values in this interval not displayed.    Estimated Creatinine Clearance: 57.1 mL/min (by C-G formula based on SCr of 1.1 mg/dL).   Medical History: Past Medical History:  Diagnosis Date  . Bursitis   . Elevated lipids   . Hypertension   . SOB (shortness of breath)     Medications:  Scheduled:  . aspirin EC  81 mg Oral Daily  . dextromethorphan-guaiFENesin  1 tablet Oral BID  . furosemide  40 mg Intravenous BID  . metoprolol tartrate  50 mg Oral BID  . sodium chloride flush  3 mL  Intravenous Q12H    Assessment: Patient arrives w/ SOB w/ on 86% RA placed on 2L O2, initial trop of 1053, EKG showing ST depression in aVL, and T wave abnormalities. No anticoagulation PTA. Patient is being started on heparin drip for  management of NSTEMI. Baseline CBC WNL, aPTT/INR pending.  4/10 @ 1602 HL 0.44, therapeutic x1  - - - Heparin temporarily stopped when transitioned from ED to floor 4/11 @ 0038 HL < 0.10 - bolus given without changing rate due to previous therapeutic level 4/11 @ 0920 HL 0.10 04/12 @ 0500 HL 0.21 subtherapeutic. Will rebolus w/ heparin 1200 units IV x 1 and increase rate to 1750 units/hr  4/12: Patient underwent cardiac catheterization this afternoon that showed significant diffuse three-vessel coronary artery disease   Troponin 1053 > 1206 > 1630 > 1510 >1501  Goal of Therapy:  Heparin level 0.3-0.7 units/ml Monitor platelets by anticoagulation protocol: Yes   Plan:  Will avoid bolus and restart Heparin infusion at 1750 units/hr approximately 8 hours post-sheath removal, as recommended by Cardiology. Will recheck HL at 0800, h/h trending down but stable will continue to monitor.  Pearla Dubonnet, PharmD Clinical Pharmacist 03/07/2020 3:12 PM

## 2020-03-07 NOTE — Progress Notes (Signed)
[place] for heparin Indication: chest pain/ACS  Allergies  Allergen Reactions  . Crestor [Rosuvastatin Calcium] Other (See Comments)    Muscle pain  . Rosuvastatin     Other reaction(s): Muscle Pain    Patient Measurements: Height: 5\' 10"  (177.8 cm) Weight: 77.1 kg (170 lb) IBW/kg (Calculated) : 73 Heparin Dosing Weight: 77.1 kg  Vital Signs: Temp: 97.3 F (36.3 C) (04/12 0620) Temp Source: Oral (04/12 0620) BP: 123/67 (04/12 0620) Pulse Rate: 94 (04/12 0620)  Labs: Recent Labs    03/05/20 0535 03/05/20 0537 03/05/20 0537 03/05/20 0901 03/05/20 1540 03/05/20 1540 03/05/20 2005 03/06/20 0038 03/06/20 0043 03/06/20 0920 03/06/20 1803 03/07/20 0058 03/07/20 0506  HGB  --  14.1   < >  --   --   --   --   --  13.3  --   --   --  12.5*  HCT  --  40.9  --   --   --   --   --   --  39.9  --   --   --  37.2*  PLT  --  199  --   --   --   --   --   --  203  --   --   --  208  APTT 36  --   --   --   --   --   --   --   --   --   --   --   --   LABPROT 14.1  --   --   --   --   --   --   --   --   --   --   --   --   INR 1.1  --   --   --   --   --   --   --   --   --   --   --   --   HEPARINUNFRC  --   --   --   --  0.44   < >  --  <0.10*  --    < > 0.31 0.30 0.21*  CREATININE  --  1.10  --   --   --   --   --   --   --   --   --   --   --   TROPONINIHS  --  1,053*  --    < > 1,630*  --  1,510* 1,501*  --   --   --   --   --    < > = values in this interval not displayed.    Estimated Creatinine Clearance: 57.1 mL/min (by C-G formula based on SCr of 1.1 mg/dL).   Medical History: Past Medical History:  Diagnosis Date  . Bursitis   . Elevated lipids   . Hypertension   . SOB (shortness of breath)     Medications:  Scheduled:  . amLODipine  5 mg Oral Daily  . aspirin EC  81 mg Oral Daily  . dextromethorphan-guaiFENesin  1 tablet Oral BID  . furosemide  20 mg Intravenous Daily  . heparin  1,200 Units Intravenous Once  .  metoprolol tartrate  25 mg Oral BID  . sodium chloride flush  3 mL Intravenous Q12H    Assessment: Patient arrives w/ SOB w/ on 86% RA placed on 2L O2, initial trop of 1053, EKG showing ST depression in  aVL, and T wave abnormalities. No anticoagulation PTA. Patient is being started on heparin drip for management of NSTEMI. Baseline CBC WNL, aPTT/INR pending.  4/10 @ 1602 HL 0.44, therapeutic x1  - - - Heparin temporarily stopped when transitioned from ED to floor 4/11 @ 0038 HL < 0.10 - bolus given without changing rate due to previous therapeutic level 4/11 @ 0920 HL 0.10  Troponin 1053 > 1206 > 1630  Goal of Therapy:  Heparin level 0.3-0.7 units/ml Monitor platelets by anticoagulation protocol: Yes   Plan:  04/12 @ 0500 HL 0.21 subtherapeutic. Will rebolus w/ heparin 1200 units IV x 1 and increase rate to 1750 units/hr and will recheck HL at 1500, h/h trending down but stable will continue to monitor.  Tobie Lords, PharmD Clinical Pharmacist 03/07/2020 6:49 AM

## 2020-03-07 NOTE — Consult Note (Signed)
Pulmonary Medicine          Date: 03/07/2020,   MRN# [id number] Andre GLOD Sr. 18-May-1941     AdmissionWeight: 77.1 kg                 CurrentWeight: 77.1 kg  Referring physician: Dr. [name]   CHIEF COMPLAINT:   Shortness of breath   HISTORY OF PRESENT ILLNESS   This is 79 year old male with a history of essential hypertension, dyslipidemia, carotid artery stenosis status post right endarterectomy who came in with complaint of worsening shortness of breath x2 to 3 weeks.  He was found to be acutely hypoxemic in the ED saturating approximately 86% and required noninvasive ventilation with BiPAP transiently.  Patient was also noted to have elevation of high-sensitivity troponin and WBC count and was evaluated by cardiology and is currently being treated for non-ST elevation MI. He had CTPE done with findings of bilateral pleural effusions as well as pulmonary edema with no PE found. Additionally mediastinal and hilar lymph node enlargment is noted which is likely reactive. LHC shows 3 vessel CAD with plan for CABG.    PAST MEDICAL HISTORY   Past Medical History:  Diagnosis Date  . Bursitis   . Elevated lipids   . Hypertension   . SOB (shortness of breath)      SURGICAL HISTORY   Past Surgical History:  Procedure Laterality Date  . CAROTID ENDARTERECTOMY Right   . COLONOSCOPY WITH PROPOFOL N/A 08/20/2016   Procedure: COLONOSCOPY WITH PROPOFOL;  Surgeon: Manya Silvas, MD;  Location: [facility] ENDOSCOPY;  Service: Endoscopy;  Laterality: N/A;  . HERNIA REPAIR     1970  (Bilateral Inguinal Hernia)  . LEFT HEART CATH AND CORONARY ANGIOGRAPHY N/A 03/07/2020   Procedure: LEFT HEART CATH AND CORONARY ANGIOGRAPHY;  Surgeon: Corey Skains, MD;  Location: [facility];  Service: Cardiovascular;  Laterality: N/A;  . SHOULDER INJECTION    . THYROIDECTOMY, PARTIAL    . VASCULAR SURGERY     RT Carotid Endarterectomy     FAMILY HISTORY   Family History    Problem Relation Age of Onset  . Stroke Mother   . Cancer Father      SOCIAL HISTORY   Social History   Tobacco Use  . Smoking status: Never Smoker  . Smokeless tobacco: Never Used  Substance Use Topics  . Alcohol use: No  . Drug use: No     MEDICATIONS    Home Medication:    Current Medication:  Current Facility-Administered Medications:  .  0.9% sodium chloride infusion, 1 mL/kg/hr, Intravenous, Continuous, Corey Skains, MD .  acetaminophen (TYLENOL) tablet 650 mg, 650 mg, Oral, Q4H PRN, Corey Skains, MD .  albuterol (PROVENTIL) (2.5 MG/3ML) 0.083% nebulizer solution 2.5 mg, 2.5 mg, Nebulization, Q4H PRN, Ivor Costa, MD .  aspirin EC tablet 81 mg, 81 mg, Oral, Daily, Ivor Costa, MD, 81 mg at 03/07/20 1103 .  dextromethorphan-guaiFENesin (MUCINEX DM) 30-600 MG per 12 hr tablet 1 tablet, 1 tablet, Oral, BID, Ivor Costa, MD, 1 tablet at 03/07/20 1103 .  furosemide (LASIX) injection 40 mg, 40 mg, Intravenous, BID, Corey Skains, MD, 40 mg at 03/07/20 1020 .  heparin ADULT infusion 100 units/mL (25000 units/274mL sodium chloride 0.45%), 1,750 Units/hr, Intravenous, Continuous, Dallie Piles, RPH .  hydrALAZINE (APRESOLINE) injection 10 mg, 10 mg, Intravenous, Q20 Min PRN, Corey Skains, MD .  labetalol (NORMODYNE) injection 10 mg, 10 mg, Intravenous,  Q10 min PRN, Corey Skains, MD .  metoprolol tartrate (LOPRESSOR) tablet 50 mg, 50 mg, Oral, BID, Corey Skains, MD .  morphine 2 MG/ML injection 1 mg, 1 mg, Intravenous, Q3H PRN, Ivor Costa, MD .  ondansetron [place]) injection 4 mg, 4 mg, Intravenous, Q6H PRN, Corey Skains, MD .  sodium chloride flush (NS) 0.9 % injection 3 mL, 3 mL, Intravenous, Q12H, Corey Skains, MD, 3 mL at 03/07/20 1104 .  temazepam (RESTORIL) capsule 15 mg, 15 mg, Oral, QHS PRN, Ivor Costa, MD, 15 mg at 03/06/20 2142    ALLERGIES   Crestor [rosuvastatin calcium] and Rosuvastatin     REVIEW OF SYSTEMS     Review of Systems:  Gen:  Denies  fever, sweats, chills weigh loss  HEENT: Denies blurred vision, double vision, ear pain, eye pain, hearing loss, nose bleeds, sore throat Cardiac:  No dizziness, chest pain or heaviness, chest tightness,edema Resp:   SOB  Gi: Denies swallowing difficulty, stomach pain, nausea or vomiting, diarrhea, constipation, bowel incontinence Gu:  Denies bladder incontinence, burning urine Ext:   Denies Joint pain, stiffness or swelling Skin: Denies  skin rash, easy bruising or bleeding or hives Endoc:  Denies polyuria, polydipsia , polyphagia or weight change Psych:   Denies depression, insomnia or hallucinations   Other:  All other systems negative   VS: BP 121/74 (BP Location: Right Arm)   Pulse 86   Temp 98.4 F (36.9 C) (Oral)   Resp 20   Ht 5\' 10"  (1.778 m)   Wt 77.1 kg   SpO2 94%   BMI 24.39 kg/m      PHYSICAL EXAM    GENERAL:NAD, no fevers, chills, no weakness no fatigue HEAD: Normocephalic, atraumatic.  EYES: Pupils equal, round, reactive to light. Extraocular muscles intact. No scleral icterus.  MOUTH: Moist mucosal membrane. Dentition intact. No abscess noted.  EAR, NOSE, THROAT: Clear without exudates. No external lesions.  NECK: Supple. No thyromegaly. No nodules. No JVD.  PULMONARY: decreased breath sounds on left , mild crackles.  CARDIOVASCULAR: S1 and S2. Regular rate and rhythm. No murmurs, rubs, or gallops. No edema. Pedal pulses 2+ bilaterally.  GASTROINTESTINAL: Soft, nontender, nondistended. No masses. Positive bowel sounds. No hepatosplenomegaly.  MUSCULOSKELETAL: No swelling, clubbing, or edema. Range of motion full in all extremities.  NEUROLOGIC: Cranial nerves II through XII are intact. No gross focal neurological deficits. Sensation intact. Reflexes intact.  SKIN: No ulceration, lesions, rashes, or cyanosis. Skin warm and dry. Turgor intact.  PSYCHIATRIC: Mood, affect within normal limits. The patient is awake, alert  and oriented x 3. Insight, judgment intact.       IMAGING    CT Angio Chest PE W/Cm &/Or Wo Cm  Result Date: 03/05/2020 CLINICAL DATA:  Shortness of breath EXAM: CT ANGIOGRAPHY CHEST WITH CONTRAST TECHNIQUE: Multidetector CT imaging of the chest was performed using the standard protocol during bolus administration of intravenous contrast. Multiplanar CT image reconstructions and MIPs were obtained to evaluate the vascular anatomy. CONTRAST:  70mL OMNIPAQUE IOHEXOL 350 MG/ML SOLN COMPARISON:  None. FINDINGS: Cardiovascular: Satisfactory opacification of the pulmonary arteries to the proximal segmental level. No evidence of pulmonary embolism. Normal heart size. Reflux of contrast into the hepatic veins, which could reflect right heart dysfunction. Coronary artery and aortic calcifications. No pericardial effusion. Mediastinum/Nodes: Mildly prominent mediastinal and hilar nodes, likely reactive. Thyroid appears partially absent and not well evaluated due to artifact. Lungs/Pleura: Small bilateral pleural effusions and mild bibasilar atelectasis. Diffuse interlobular  septal thickening. Mild peribronchial thickening. No pneumothorax. Upper Abdomen: Probable small hiatal hernia.  Small hepatic cysts. Musculoskeletal: No chest wall abnormality. Mild thoracic spine degenerative changes. No acute osseous abnormality. Review of the MIP images confirms the above findings. IMPRESSION: Suboptimal evaluation due to motion artifact. No evidence of acute pulmonary embolism. Interstitial pulmonary edema.  Small bilateral pleural effusions. Electronically Signed   By: Macy Mis M.D.   On: 03/05/2020 07:42   CARDIAC CATHETERIZATION  Result Date: 03/07/2020  Ost RCA to Prox RCA lesion is 100% stenosed.  Dist RCA lesion is 100% stenosed.  Prox Cx to Mid Cx lesion is 99% stenosed.  Mid Cx lesion is 85% stenosed.  Prox Cx lesion is 85% stenosed.  1st Mrg lesion is 90% stenosed.  Prox LAD lesion is 50% stenosed.   1st Diag lesion is 90% stenosed.  Mid LAD-1 lesion is 85% stenosed.  Mid LAD-2 lesion is 85% stenosed.  79 year old male with hypertension hyperlipidemia and severe peripheral vascular disease with significant progression of shortness of breath and acute non-ST elevation myocardial infarction Echocardiogram showing moderate segmental LV systolic dysfunction with septal distal anterior and apical hypokinesis with ejection fraction of 35% Aortic valve velocities at 3.2 m/s with a mean of 24 mm of peak of 17mm consistent with moderate aortic valve stenosis This may be underestimated due to LV systolic dysfunction 2 consistent with moderate to severe aortic valve stenosis Aortic root dilation of 5.6 cm by angiography Critical three-vessel coronary artery disease with multiple coronary atherosclerosis of multiple areas Unable to cross valve due to significant stenoses and dilation of aortic root Assessment Acute non-ST elevation myocardial infarction Moderate to severe aortic valve stenosis 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 Hypertension. EXAM: PORTABLE CHEST 1 VIEW COMPARISON:  CT 1 day prior FINDINGS: Midline trachea. Mild cardiomegaly. The previously described bilateral pleural effusions are not readily apparent. Mild pulmonary venous congestion, with improved interstitial edema. No  lobar consolidation. Numerous leads and wires project over the chest. IMPRESSION: Cardiomegaly with mild pulmonary venous congestion, improved. Electronically Signed   By: Abigail Miyamoto M.D.   On: 03/07/2020 08:21   ECHOCARDIOGRAM COMPLETE  Result Date: 03/06/2020    ECHOCARDIOGRAM REPORT   Patient Name:   Andre Murphy Date of Exam: 03/06/2020 Medical Rec #:  [id number]      Height:       70.0 in Accession #:    [id number]     Weight:       170.0 lb Date of Birth:  1941/06/17      BSA:          1.948 m Patient Age:    42 years       BP:           122/77 mmHg Patient Gender: M              HR:           87 bpm. Exam Location:  [facility] Procedure: 2D  Echo, Color Doppler and Cardiac Doppler Indications:     R07.9 Chest Pain  History:         Patient has no prior history of Echocardiogram examinations.                  Signs/Symptoms:Shortness of Breath and Chest Pain; Risk                  Factors:Hypertension.  Sonographer:     Charmayne Sheer RDCS (AE) Referring Phys:  [id number] Soledad Gerlach NIU Diagnosing Phys: Serafina Royals MD  Sonographer Comments: Suboptimal parasternal window. IMPRESSIONS  1. Left ventricular ejection fraction, by estimation, is 35 to 40%. The left ventricle has moderately decreased function. The left ventricle demonstrates regional wall motion abnormalities (see scoring diagram/findings for description). Left ventricular  diastolic parameters were normal.  2. Right ventricular systolic function is normal. The right ventricular size is normal.  3. Left atrial size was mildly dilated.  4. The mitral valve is normal in structure. Mild mitral valve regurgitation.  5. The aortic valve is normal in structure. Aortic valve regurgitation is trivial. Moderate aortic valve stenosis. FINDINGS  Left Ventricle: Left ventricular ejection fraction, by estimation, is 35 to 40%. The left ventricle has moderately decreased function. The left ventricle demonstrates regional wall motion abnormalities. Moderate hypokinesis of the  left ventricular, mid-apical inferoseptal wall and anteroseptal wall. The left ventricular internal cavity size was normal in size. There is borderline left ventricular hypertrophy. Left ventricular diastolic parameters were normal. Right Ventricle: The right ventricular size is normal. No increase in right ventricular wall thickness. Right ventricular systolic function is normal. Left Atrium: Left atrial size was mildly dilated. Right Atrium: Right atrial size was normal in size. Pericardium: There is no evidence of pericardial effusion. Mitral Valve: The mitral valve is normal in structure. Mild mitral valve regurgitation. MV peak gradient, 5.1 mmHg. The mean mitral valve gradient is 2.0 mmHg. Tricuspid Valve: The tricuspid valve is normal in structure. Tricuspid valve regurgitation is mild. Aortic Valve: The aortic valve is normal in structure. Aortic valve regurgitation is trivial. Moderate aortic stenosis is present. Aortic valve mean gradient measures 19.0 mmHg. Aortic valve peak gradient measures 33.3 mmHg. Aortic valve area, by VTI measures 0.68 cm. Pulmonic Valve: The pulmonic valve was grossly normal. Pulmonic valve regurgitation is not visualized. Aorta: The aortic root and ascending aorta are structurally normal, with no evidence of dilitation. IAS/Shunts: No atrial level shunt detected by color flow Doppler.  LEFT VENTRICLE PLAX 2D LVIDd:         3.87 cm      Diastology LVIDs:         3.17 cm      LV e' lateral:   7.18 cm/s LV PW:         0.93 cm      LV E/e' lateral: 14.6 LV IVS:        0.87 cm      LV e' medial:    5.77 cm/s LVOT diam:     2.20 cm      LV E/e' medial:  18.2 LV SV:         36 LV SV Index:   18 LVOT Area:     3.80 cm  LV Volumes (MOD) LV vol d, MOD A2C: 116.0 ml LV vol d, MOD A4C: 108.0 ml LV vol s, MOD A2C: 88.3 ml LV vol s, MOD A4C: 79.7 ml LV SV MOD A2C:     27.7 ml LV  SV MOD A4C:     108.0 ml LV SV MOD BP:      32.2 ml LEFT ATRIUM           Index       RIGHT ATRIUM           Index  LA diam:      4.20 cm 2.16 cm/m  RA Area:     14.60 cm LA Vol (A4C): 48.0 ml 24.64 ml/m RA Volume:   38.10 ml  19.56 ml/m  AORTIC VALVE                    PULMONIC VALVE AV Area (Vmax):    0.72 cm     PV Vmax:       0.67 m/s AV Area (Vmean):   0.70 cm     PV Vmean:      45.800 cm/s AV Area (VTI):     0.68 cm     PV VTI:        0.115 m AV Vmax:           288.67 cm/s  PV Peak grad:  1.8 mmHg AV Vmean:          198.667 cm/s PV Mean grad:  1.0 mmHg AV VTI:            0.524 m AV Peak Grad:      33.3 mmHg AV Mean Grad:      19.0 mmHg LVOT Vmax:         54.70 cm/s LVOT Vmean:        36.700 cm/s LVOT VTI:          0.094 m LVOT/AV VTI ratio: 0.18  AORTA Ao Root diam: 3.40 cm MITRAL VALVE MV Area (PHT): 5.05 cm     SHUNTS MV Peak grad:  5.1 mmHg     Systemic VTI:  0.09 m MV Mean grad:  2.0 mmHg     Systemic Diam: 2.20 cm MV Vmax:       1.13 m/s MV Vmean:      63.5 cm/s MV Decel Time: 150 msec MV E velocity: 105.17 cm/s MV A velocity: 56.90 cm/s MV E/A ratio:  1.85 Serafina Royals MD Electronically signed by Serafina Royals MD Signature Date/Time: 03/06/2020/7:09:42 PM    Final       ASSESSMENT/PLAN    Acute hypoxemic respiratory failure   - patient had NSTEMI , and folllow up TTE with systolic CHF EF [id number]   -CT chest with interstitial thickening from pulmonary edema and bilateral pleural effusions  - agree with cardiology workup. His LHC is abnormal with multi vessel occlusive disease and plan for CABG.   - agree with diuresis  - patient is currently able to speak in full sentences and shares the SOB has improved signficantly  -patient is a never smoker no hx of COPD or emphysema  -patient is not on any pulmonary toxic medications  -mediastinal and hilar lymphadenopathy is likely due to congestive heart failure with will decrease with diuresis.  -Additional evaluation in pulmonary clinic post D/C -nursing staff on 2A are trained to take care of cardiac patients and have asked me if patient may be  transferred, im okay with this if no objections by others.    Thank you for allowing me to participate in the care of this patient.   Patient/Family are satisfied with care plan and all questions have been answered.   This document was prepared using Dragon  voice recognition software and may include unintentional dictation errors.     Ottie Glazier, M.D.  Division of [place]

## 2020-03-07 NOTE — Progress Notes (Signed)
Patients oxygen saturation dipping to mid 80's turned his HFNC back up to 10L. Now has an oxygen saturation of 90% Will continue to monitor.

## 2020-03-07 NOTE — Progress Notes (Addendum)
ANTICOAGULATION CONSULT NOTE  Pharmacy Consult for heparin Indication: chest pain/ACS  Patient Measurements: Height: 5\' 10"  (177.8 cm) Weight: 77.1 kg (170 lb) IBW/kg (Calculated) : 73 Heparin Dosing Weight: 77.1 kg  Vital Signs: Temp: 97.3 F (36.3 C) (04/12 0620) Temp Source: Oral (04/12 0620) BP: 123/67 (04/12 0620) Pulse Rate: 94 (04/12 0620)  Labs: Recent Labs    03/05/20 0535 03/05/20 0537 03/05/20 0537 03/05/20 0901 03/05/20 1540 03/05/20 1540 03/05/20 2005 03/06/20 0038 03/06/20 0043 03/06/20 0920 03/06/20 1803 03/07/20 0058 03/07/20 0506  HGB  --  14.1   < >  --   --   --   --   --  13.3  --   --   --  12.5*  HCT  --  40.9  --   --   --   --   --   --  39.9  --   --   --  37.2*  PLT  --  199  --   --   --   --   --   --  203  --   --   --  208  APTT 36  --   --   --   --   --   --   --   --   --   --   --   --   LABPROT 14.1  --   --   --   --   --   --   --   --   --   --   --   --   INR 1.1  --   --   --   --   --   --   --   --   --   --   --   --   HEPARINUNFRC  --   --   --   --  0.44   < >  --  <0.10*  --    < > 0.31 0.30 0.21*  CREATININE  --  1.10  --   --   --   --   --   --   --   --   --   --   --   TROPONINIHS  --  1,053*  --    < > 1,630*  --  1,510* 1,501*  --   --   --   --   --    < > = values in this interval not displayed.    Estimated Creatinine Clearance: 57.1 mL/min (by C-G formula based on SCr of 1.1 mg/dL).   Medical History: Past Medical History:  Diagnosis Date  . Bursitis   . Elevated lipids   . Hypertension   . SOB (shortness of breath)     Medications:  Scheduled:  . amLODipine  5 mg Oral Daily  . [START ON 03/08/2020] aspirin  81 mg Oral Pre-Cath  . aspirin EC  81 mg Oral Daily  . dextromethorphan-guaiFENesin  1 tablet Oral BID  . furosemide  20 mg Intravenous Daily  . metoprolol tartrate  25 mg Oral BID  . sodium chloride flush  3 mL Intravenous Q12H    Assessment: Patient arrives w/ SOB w/ on 86% RA placed on  2L O2, initial trop of 1053, EKG showing ST depression in aVL, and T wave abnormalities. No anticoagulation PTA. Patient is being started on heparin drip for management of NSTEMI. Baseline CBC WNL, aPTT/INR pending. The most recent anti-Xa  level from this morning was subtherapeutic. Later in the morning at 1219 heparin was held for a cardiac catheterization. We have orders to restart the heparin 2 hours after sheath removal (1340)  Goal of Therapy:  Heparin level 0.3-0.7 units/ml Monitor platelets by anticoagulation protocol: Yes   Plan:   Restart heparin at 1750 units/hr beginning at 1540 (no bolus)  recheck anti-Xa level 8 hours after restarting  CBC in am  Dallie Piles, PharmD Clinical Pharmacist 03/07/2020 7:26 AM

## 2020-03-07 NOTE — Progress Notes (Signed)
PT Cancellation Note  Patient Details Name: ADRIK PANNO Sr. MRN: [id number] DOB: Dec 28, 1940   Cancelled Treatment:    Reason Eval/Treat Not Completed: Other (comment). Pt currently out room for cardiac cath, not available for therapy at this time. Will re-attempt next date.   Kyen Taite 03/07/2020, 12:50 PM  Greggory Stallion, PT, DPT [phone]

## 2020-03-07 NOTE — Progress Notes (Signed)
[facility] Cardiology [facility] Encounter Note  Patient: Andre HENSCHEN Sr. / Admit Date: 03/05/2020 / Date of Encounter: 03/07/2020, 1:45 PM   Subjective: Patient feeling much better today.  No evidence of chest pain.  Patient rested well overnight.  Troponin elevation now up to 1630 consistent with non-ST elevation myocardial infarction.  Telemetry shows normal sinus rhythm.  Patient is remaining on appropriate medication management for non-ST elevation myocardial infarction  Echocardiogram showing moderate LV systolic dysfunction with septal distal anterior and apical hypokinesis with ejection fraction of 35% with Moderate aortic valve stenosis with 3.23 m/s velocity with a mean of 24 mm of a peak of 54 mm Cardiac catheterization showing significant diffuse three-vessel coronary artery disease with 90% mid LAD 90% and diffuse mid circumflex and 100% proximal right coronary artery stenosis Could not fully cross aortic valve for further pressures or assessment of aortic valve stenosis at this time Chest x-ray shows resolving pulmonary edema consistent with congestive heart failure Review of Systems: Positive for: None Negative for: Vision change, hearing change, syncope, dizziness, nausea, vomiting,diarrhea, bloody stool, stomach pain, cough, congestion, diaphoresis, urinary frequency, urinary pain,skin lesions, skin rashes Others previously listed  Objective: Telemetry: Normal sinus rhythm Physical Exam: Blood pressure 133/77, pulse 99, temperature 97.8 F (36.6 C), temperature source Oral, resp. rate 20, height 5\' 10"  (1.778 m), weight 77.1 kg, SpO2 96 %. Body mass index is 24.39 kg/m. General: Well developed, well nourished, in no acute distress. Head: Normocephalic, atraumatic, sclera non-icteric, no xanthomas, nares are without discharge. Neck: No apparent masses Lungs: Normal respirations with no wheezes, no rhonchi, no rales , few basilar crackles   Heart: Regular rate and rhythm,  normal S1 S2, 2-3+ aortic murmur, no rub, no gallop, PMI is normal size and placement, carotid upstroke normal without bruit, jugular venous pressure normal Abdomen: Soft, non-tender, non-distended with normoactive bowel sounds. No hepatosplenomegaly. Abdominal aorta is normal size without bruit Extremities: Trace edema, no clubbing, no cyanosis, no ulcers,  Peripheral: 2+ radial, 2+ femoral, 2+ dorsal pedal pulses Neuro: Alert and oriented. Moves all extremities spontaneously. Psych:  Responds to questions appropriately with a normal affect.   Intake/Output Summary (Last 24 hours) at 03/07/2020 1345 Last data filed at 03/07/2020 1237 Gross per 24 hour  Intake 628.66 ml  Output 1250 ml  Net -621.34 ml    Inpatient Medications:  . [MAR Hold] amLODipine  5 mg Oral Daily  . [START ON 03/08/2020] aspirin  81 mg Oral Pre-Cath  . [MAR Hold] aspirin EC  81 mg Oral Daily  . [MAR Hold] dextromethorphan-guaiFENesin  1 tablet Oral BID  . [MAR Hold] furosemide  40 mg Intravenous BID  . [MAR Hold] metoprolol tartrate  25 mg Oral BID  . [MAR Hold] sodium chloride flush  3 mL Intravenous Q12H   Infusions:  . sodium chloride 250 mL (03/07/20 1237)  . [START ON 03/08/2020] sodium chloride     Followed by  . [START ON 03/08/2020] sodium chloride    . heparin Stopped (03/07/20 1230)    Labs: Recent Labs    03/05/20 0537  NA 139  K 4.2  CL 107  CO2 25  GLUCOSE 126*  BUN 28*  CREATININE 1.10  CALCIUM 8.7*   No results for input(s): AST, ALT, ALKPHOS, BILITOT, PROT, ALBUMIN in the last 72 hours. Recent Labs    03/05/20 0537 03/05/20 0537 03/06/20 0043 03/07/20 0506  WBC 14.8*   < > 15.1* 11.4*  NEUTROABS 12.5*  --   --   --  HGB 14.1   < > 13.3 12.5*  HCT 40.9   < > 39.9 37.2*  MCV 87.6   < > 89.3 89.2  PLT 199   < > 203 208   < > = values in this interval not displayed.   No results for input(s): CKTOTAL, CKMB, TROPONINI in the last 72 hours. Invalid input(s): POCBNP Recent Labs     03/06/20 0043  HGBA1C 5.4     Weights: [employer]   03/05/20 0516  Weight: 77.1 kg     Radiology/Studies:  CT Angio Chest PE W/Cm &/Or Wo Cm  Result Date: 03/05/2020 CLINICAL DATA:  Shortness of breath EXAM: CT ANGIOGRAPHY CHEST WITH CONTRAST TECHNIQUE: Multidetector CT imaging of the chest was performed using the standard protocol during bolus administration of intravenous contrast. Multiplanar CT image reconstructions and MIPs were obtained to evaluate the vascular anatomy. CONTRAST:  76mL OMNIPAQUE IOHEXOL 350 MG/ML SOLN COMPARISON:  None. FINDINGS: Cardiovascular: Satisfactory opacification of the pulmonary arteries to the proximal segmental level. No evidence of pulmonary embolism. Normal heart size. Reflux of contrast into the hepatic veins, which could reflect right heart dysfunction. Coronary artery and aortic calcifications. No pericardial effusion. Mediastinum/Nodes: Mildly prominent mediastinal and hilar nodes, likely reactive. Thyroid appears partially absent and not well evaluated due to artifact. Lungs/Pleura: Small bilateral pleural effusions and mild bibasilar atelectasis. Diffuse interlobular septal thickening. Mild peribronchial thickening. No pneumothorax. Upper Abdomen: Probable small hiatal hernia.  Small hepatic cysts. Musculoskeletal: No chest wall abnormality. Mild thoracic spine degenerative changes. No acute osseous abnormality. Review of the MIP images confirms the above findings. IMPRESSION: Suboptimal evaluation due to motion artifact. No evidence of acute pulmonary embolism. Interstitial pulmonary edema.  Small bilateral pleural effusions. Electronically Signed   By: Macy Mis M.D.   On: 03/05/2020 07:42   DG Chest Port 1 View  Result Date: 03/07/2020 CLINICAL DATA:  CHF. Hypertension. EXAM: PORTABLE CHEST 1 VIEW COMPARISON:  CT 1 day prior FINDINGS: Midline trachea. Mild cardiomegaly. The previously described bilateral pleural effusions are not readily  apparent. Mild pulmonary venous congestion, with improved interstitial edema. No lobar consolidation. Numerous leads and wires project over the chest. IMPRESSION: Cardiomegaly with mild pulmonary venous congestion, improved. Electronically Signed   By: Abigail Miyamoto M.D.   On: 03/07/2020 08:21   ECHOCARDIOGRAM COMPLETE  Result Date: 03/06/2020    ECHOCARDIOGRAM REPORT   Patient Name:   AMERICUS KORBY Date of Exam: 03/06/2020 Medical Rec #:  [id number]      Height:       70.0 in Accession #:    [id number]     Weight:       170.0 lb Date of Birth:  1941-01-20      BSA:          1.948 m Patient Age:    8 years       BP:           122/77 mmHg Patient Gender: M              HR:           87 bpm. Exam Location:  [facility] Procedure: 2D Echo, Color Doppler and Cardiac Doppler Indications:     R07.9 Chest Pain  History:         Patient has no prior history of Echocardiogram examinations.                  Signs/Symptoms:Shortness of Breath and Chest Pain; Risk  Factors:Hypertension.  Sonographer:     Charmayne Sheer RDCS (AE) Referring Phys:  [id number] Soledad Gerlach NIU Diagnosing Phys: Serafina Royals MD  Sonographer Comments: Suboptimal parasternal window. IMPRESSIONS  1. Left ventricular ejection fraction, by estimation, is 35 to 40%. The left ventricle has moderately decreased function. The left ventricle demonstrates regional wall motion abnormalities (see scoring diagram/findings for description). Left ventricular  diastolic parameters were normal.  2. Right ventricular systolic function is normal. The right ventricular size is normal.  3. Left atrial size was mildly dilated.  4. The mitral valve is normal in structure. Mild mitral valve regurgitation.  5. The aortic valve is normal in structure. Aortic valve regurgitation is trivial. Moderate aortic valve stenosis. FINDINGS  Left Ventricle: Left ventricular ejection fraction, by estimation, is 35 to 40%. The left ventricle has moderately decreased function. The left  ventricle demonstrates regional wall motion abnormalities. Moderate hypokinesis of the left ventricular, mid-apical inferoseptal wall and anteroseptal wall. The left ventricular internal cavity size was normal in size. There is borderline left ventricular hypertrophy. Left ventricular diastolic parameters were normal. Right Ventricle: The right ventricular size is normal. No increase in right ventricular wall thickness. Right ventricular systolic function is normal. Left Atrium: Left atrial size was mildly dilated. Right Atrium: Right atrial size was normal in size. Pericardium: There is no evidence of pericardial effusion. Mitral Valve: The mitral valve is normal in structure. Mild mitral valve regurgitation. MV peak gradient, 5.1 mmHg. The mean mitral valve gradient is 2.0 mmHg. Tricuspid Valve: The tricuspid valve is normal in structure. Tricuspid valve regurgitation is mild. Aortic Valve: The aortic valve is normal in structure. Aortic valve regurgitation is trivial. Moderate aortic stenosis is present. Aortic valve mean gradient measures 19.0 mmHg. Aortic valve peak gradient measures 33.3 mmHg. Aortic valve area, by VTI measures 0.68 cm. Pulmonic Valve: The pulmonic valve was grossly normal. Pulmonic valve regurgitation is not visualized. Aorta: The aortic root and ascending aorta are structurally normal, with no evidence of dilitation. IAS/Shunts: No atrial level shunt detected by color flow Doppler.  LEFT VENTRICLE PLAX 2D LVIDd:         3.87 cm      Diastology LVIDs:         3.17 cm      LV e' lateral:   7.18 cm/s LV PW:         0.93 cm      LV E/e' lateral: 14.6 LV IVS:        0.87 cm      LV e' medial:    5.77 cm/s LVOT diam:     2.20 cm      LV E/e' medial:  18.2 LV SV:         36 LV SV Index:   18 LVOT Area:     3.80 cm  LV Volumes (MOD) LV vol d, MOD A2C: 116.0 ml LV vol d, MOD A4C: 108.0 ml LV vol s, MOD A2C: 88.3 ml LV vol s, MOD A4C: 79.7 ml LV SV MOD A2C:     27.7 ml LV SV MOD A4C:     108.0 ml LV  SV MOD BP:      32.2 ml LEFT ATRIUM           Index       RIGHT ATRIUM           Index LA diam:      4.20 cm 2.16 cm/m  RA Area:     14.60 cm  LA Vol (A4C): 48.0 ml 24.64 ml/m RA Volume:   38.10 ml  19.56 ml/m  AORTIC VALVE                    PULMONIC VALVE AV Area (Vmax):    0.72 cm     PV Vmax:       0.67 m/s AV Area (Vmean):   0.70 cm     PV Vmean:      45.800 cm/s AV Area (VTI):     0.68 cm     PV VTI:        0.115 m AV Vmax:           288.67 cm/s  PV Peak grad:  1.8 mmHg AV Vmean:          198.667 cm/s PV Mean grad:  1.0 mmHg AV VTI:            0.524 m AV Peak Grad:      33.3 mmHg AV Mean Grad:      19.0 mmHg LVOT Vmax:         54.70 cm/s LVOT Vmean:        36.700 cm/s LVOT VTI:          0.094 m LVOT/AV VTI ratio: 0.18  AORTA Ao Root diam: 3.40 cm MITRAL VALVE MV Area (PHT): 5.05 cm     SHUNTS MV Peak grad:  5.1 mmHg     Systemic VTI:  0.09 m MV Mean grad:  2.0 mmHg     Systemic Diam: 2.20 cm MV Vmax:       1.13 m/s MV Vmean:      63.5 cm/s MV Decel Time: 150 msec MV E velocity: 105.17 cm/s MV A velocity: 56.90 cm/s MV E/A ratio:  1.85 Serafina Royals MD Electronically signed by Serafina Royals MD Signature Date/Time: 03/06/2020/7:09:42 PM    Final      Assessment and Recommendation  79 y.o. male 79 year old male with hypertension hyperlipidemia peripheral vascular disease with acute non-ST elevation myocardial infarction and moderate aortic valve stenosis with critical three-vessel coronary artery disease likely contributing to above with slow improvements at this time 1.  Continue heparin aspirin and addition of beta-blocker today for further risk reduction of myocardial infarction 2.  Continuation of Lasix for pulmonary edema congestive heart failure 3.  Further reassessment of Doppler of echocardiogram to assess extent of aortic valve stenosis 4.  Maximize medical management and recovery from non-ST elevation myocardial infarction with congestive heart failure and further consideration of  coronary artery bypass surgery with or without aortic valve intervention depending on further evaluation of above Signed, Serafina Royals M.D. FACC

## 2020-03-07 NOTE — Progress Notes (Signed)
OT Cancellation Note  Patient Details Name: Andre OSTERBERG Sr. MRN: [id number] DOB: 03/19/1941   Cancelled Treatment:    Reason Eval/Treat Not Completed: Other (comment). Pt currently out of room for cardiac catheterization and unavailable at this time. Will follow acutely and re-attempt OT evaluation as available.   Dessie Coma, M.S. OTR/L  03/07/20, 1:35 PM

## 2020-03-07 NOTE — Progress Notes (Signed)
[facility] for heparin Indication: chest pain/ACS  Allergies  Allergen Reactions  . Crestor [Rosuvastatin Calcium] Other (See Comments)    Muscle pain  . Rosuvastatin     Other reaction(s): Muscle Pain    Patient Measurements: Height: 5\' 10"  (177.8 cm) Weight: 77.1 kg (170 lb) IBW/kg (Calculated) : 73 Heparin Dosing Weight: 77.1 kg  Vital Signs: Temp: 97.6 F (36.4 C) (04/11 2141) Temp Source: Oral (04/11 2141) BP: 124/81 (04/11 2141) Pulse Rate: 96 (04/11 2141)  Labs: Recent Labs    03/05/20 0535 03/05/20 0537 03/05/20 0901 03/05/20 1540 03/05/20 1540 03/05/20 2005 03/06/20 0038 03/06/20 0038 03/06/20 0043 03/06/20 0920 03/06/20 1803 03/07/20 0058  HGB  --  14.1  --   --   --   --   --   --  13.3  --   --   --   HCT  --  40.9  --   --   --   --   --   --  39.9  --   --   --   PLT  --  199  --   --   --   --   --   --  203  --   --   --   APTT 36  --   --   --   --   --   --   --   --   --   --   --   LABPROT 14.1  --   --   --   --   --   --   --   --   --   --   --   INR 1.1  --   --   --   --   --   --   --   --   --   --   --   HEPARINUNFRC  --   --   --  0.44   < >  --  <0.10*   < >  --  0.10* 0.31 0.30  CREATININE  --  1.10  --   --   --   --   --   --   --   --   --   --   TROPONINIHS  --  1,053*   < > 1,630*  --  1,510* 1,501*  --   --   --   --   --    < > = values in this interval not displayed.    Estimated Creatinine Clearance: 57.1 mL/min (by C-G formula based on SCr of 1.1 mg/dL).   Medical History: Past Medical History:  Diagnosis Date  . Bursitis   . Elevated lipids   . Hypertension   . SOB (shortness of breath)     Medications:  Scheduled:  . amLODipine  5 mg Oral Daily  . aspirin EC  81 mg Oral Daily  . dextromethorphan-guaiFENesin  1 tablet Oral BID  . furosemide  20 mg Intravenous Daily  . metoprolol tartrate  25 mg Oral BID  . sodium chloride flush  3 mL Intravenous Q12H     Assessment: Patient arrives w/ SOB w/ on 86% RA placed on 2L O2, initial trop of 1053, EKG showing ST depression in aVL, and T wave abnormalities. No anticoagulation PTA. Patient is being started on heparin drip for management of NSTEMI. Baseline CBC WNL, aPTT/INR pending.  4/10 @ 1602 HL 0.44, therapeutic x1  - - -  Heparin temporarily stopped when transitioned from ED to floor 4/11 @ 0038 HL < 0.10 - bolus given without changing rate due to previous therapeutic level 4/11 @ 0920 HL 0.10  Troponin 1053 > 1206 > 1630  Goal of Therapy:  Heparin level 0.3-0.7 units/ml Monitor platelets by anticoagulation protocol: Yes   Plan:  04/12 @ 0100 HL 0.30 therapeutic. Will continue current rate and will recheck HL w/ am labs and continue to monitor.  Tobie Lords, PharmD Clinical Pharmacist 03/07/2020 2:36 AM

## 2020-03-07 NOTE — Progress Notes (Addendum)
Received phone call from cath lab. They have advised this nurse they have released orders for pre procedure. I have passed this  onto the oncoming nurse. They have also requested another IV and I have placed a IV consult order. Pharmacy also has contacted me and patients Heparin gtt increased and also given a bolus.   Patient currently on 11 L per high flow [place]. This has been relayed to the cath nurse she has states she will contact the provider as is requiring a high amount of oxygen and oxygen saturation continues to be between 88-93% throughout the night.

## 2020-03-08 ENCOUNTER — Encounter: Payer: Self-pay | Admitting: Internal Medicine

## 2020-03-08 ENCOUNTER — Inpatient Hospital Stay: Payer: Medicare Other

## 2020-03-08 DIAGNOSIS — I214 Non-ST elevation (NSTEMI) myocardial infarction: Secondary | ICD-10-CM | POA: Diagnosis not present

## 2020-03-08 DIAGNOSIS — J9601 Acute respiratory failure with hypoxia: Secondary | ICD-10-CM | POA: Diagnosis not present

## 2020-03-08 DIAGNOSIS — I5021 Acute systolic (congestive) heart failure: Secondary | ICD-10-CM | POA: Diagnosis not present

## 2020-03-08 LAB — CBC
HCT: 37.2 % — ABNORMAL LOW (ref 39.0–52.0)
Hemoglobin: 12.5 g/dL — ABNORMAL LOW (ref 13.0–17.0)
MCH: 29.7 pg (ref 26.0–34.0)
MCHC: 33.6 g/dL (ref 30.0–36.0)
MCV: 88.4 fL (ref 80.0–100.0)
Platelets: 240 10*3/uL (ref 150–400)
RBC: 4.21 MIL/uL — ABNORMAL LOW (ref 4.22–5.81)
RDW: 12.8 % (ref 11.5–15.5)
WBC: 9.5 10*3/uL (ref 4.0–10.5)
nRBC: 0 % (ref 0.0–0.2)

## 2020-03-08 LAB — HEPARIN LEVEL (UNFRACTIONATED): Heparin Unfractionated: 0.24 IU/mL — ABNORMAL LOW (ref 0.30–0.70)

## 2020-03-08 LAB — BASIC METABOLIC PANEL
Anion gap: 11 (ref 5–15)
BUN: 26 mg/dL — ABNORMAL HIGH (ref 8–23)
CO2: 26 mmol/L (ref 22–32)
Calcium: 8.1 mg/dL — ABNORMAL LOW (ref 8.9–10.3)
Chloride: 104 mmol/L (ref 98–111)
Creatinine, Ser: 1.06 mg/dL (ref 0.61–1.24)
GFR calc Af Amer: >60 mL/min (ref >60)
GFR calc non Af Amer: >60 mL/min (ref >60)
Glucose, Bld: 91 mg/dL (ref 70–99)
Potassium: 3.5 mmol/L (ref 3.5–5.1)
Sodium: 141 mmol/L (ref 135–145)

## 2020-03-08 LAB — MAGNESIUM: Magnesium: 1.8 mg/dL (ref 1.7–2.4)

## 2020-03-08 MED ORDER — ISOSORBIDE MONONITRATE ER 30 MG PO TB24
30.0000 mg | ORAL_TABLET | Freq: Every day | ORAL | Status: DC
  Administered 2020-03-08 – 2020-03-10 (×3): 30 mg via ORAL
  Filled 2020-03-08 (×3): qty 1

## 2020-03-08 MED ORDER — IOHEXOL 350 MG/ML SOLN
75.0000 mL | Freq: Once | INTRAVENOUS | Status: AC | PRN
  Administered 2020-03-08: 75 mL via INTRAVENOUS

## 2020-03-08 MED ORDER — ENOXAPARIN SODIUM 40 MG/0.4ML ~~LOC~~ SOLN
40.0000 mg | SUBCUTANEOUS | Status: DC
  Administered 2020-03-08 – 2020-03-09 (×2): 40 mg via SUBCUTANEOUS
  Filled 2020-03-08 (×2): qty 0.4

## 2020-03-08 NOTE — Care Management Important Message (Signed)
Important Message  Patient Details  Name: Andre BIXLER Sr. MRN: [id number] Date of Birth: 01-Jun-1941   Medicare Important Message Given:  Yes     Dannette Barbara 03/08/2020, 11:39 AM

## 2020-03-08 NOTE — Progress Notes (Signed)
PROGRESS NOTE    Andre THUNDER Sr.  [id number]:[id number] DOB: September 07, 1941 DOA: 03/05/2020 PCP: [place] Pink, MD (Confirm with patient/family/NH records and if not entered, this HAS to be entered at [facility] point of entry. "No PCP" if truly none.)   Brief Narrative: (Start on day 1 of progress note - keep it brief and live) Patient is a 79 year old male with history of hypertension, dyslipidemia, carotid artery stenosis status post right endarterectomy, who initially admitted to the hospital on 4/10 with complaints of intermittent shortness of breath.  He had an elevated troponin.  He was diagnosed with non-ST elevation myocardial infarction.  Cardiac cath was performed on 4/12, multivessel disease.  Patient also has evidence of a congestive heart failure, is currently receiving IV Lasix.  4/13.  Still receiving Lasix for volume overload.  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  [facility] 1 to 2 days per cardiology.   Assessment & Plan:   #1.  Acute hypoxemic respiratory failure secondary to pulmonary edema. Condition much improved today.  Continue IV Lasix.  Patient is off oxygen this morning.  #2.  Non-STEMI.  Cardiac cath showed multivessel disease, patient is continued on aspirin and beta-blocker.  Eventually patient be transferred to [facility] for coronary bypass grafting. Dr. [name] is communicating with the [facility] to decide if patient need to be transferred directly or send home first.  #3.  Acute systolic congestive heart failure secondary to non-STEMI.  Ejection fraction 35% on heart cath.  Continue IV Lasix twice a day per cardiology recommendation. Next 4.  Essential hypertension.  Continue home medicines.   DVT prophylaxis: Lovenox Code Status: Full code Family Communication: Discussed with the patient, all questions answered.  Disposition Plan:  . Patient came from:  Home            . Anticipated d/c place: transfer to [facility]. . Barriers to d/c OR conditions which need to be met to effect a safe d/c:   Consultants:   Cardiology and pulmonology   Procedures: Coronary angiogram  Antimicrobials: None  Subjective: Patient feels much better today, he was able to walk in the hallway without any short of breath.  He does not have any cough.  No chest pain.  Objective: Vitals:   03/08/20 0544 03/08/20 0547 03/08/20 0735 03/08/20 1134  BP:  115/68 123/74 104/71  Pulse:  99 87 83  Resp:   18 18  Temp:  97.7 F (36.5 C) 98.1 F (36.7 C) 98.2 F (36.8 C)  TempSrc:  Oral Oral Oral  SpO2:  98% 96% 95%  Weight: 81.9 kg     Height:        Intake/Output Summary (Last 24 hours) at 03/08/2020 1159 Last data filed at 03/08/2020 1135 Gross per 24 hour  Intake 858.55 ml  Output 1825 ml  Net -966.45 ml   Filed Weights   03/05/20 0516 03/07/20 1958 03/08/20 0544  Weight: 77.1 kg 82.5 kg 81.9 kg    Examination:  General exam: Appears calm and comfortable  Respiratory system: Clear to auscultation. Respiratory effort normal. Cardiovascular system: S1 & S2 heard, RRR. No JVD, murmurs, rubs, gallops or clicks. No pedal edema. Gastrointestinal system: Abdomen is nondistended, soft and nontender. No organomegaly or masses felt. Normal bowel sounds heard. Central nervous system: Alert and oriented. No focal neurological deficits. Extremities: Symmetric 5 x 5 power. Skin: No rashes, lesions or ulcers Psychiatry: Judgement and insight  appear normal. Mood & affect appropriate.     Data Reviewed: I have personally reviewed following labs and imaging studies  CBC: Recent Labs  Lab 03/05/20 0537 03/06/20 0043 03/07/20 0506 03/08/20 0009  WBC 14.8* 15.1* 11.4* 9.5  NEUTROABS 12.5*  --   --   --   HGB 14.1 13.3 12.5* 12.5*  HCT 40.9 39.9 37.2* 37.2*  MCV 87.6 89.3 89.2 88.4  PLT 199 203 208 388   Basic Metabolic Panel: Recent Labs  Lab  03/05/20 0537 03/08/20 0009  NA 139 141  K 4.2 3.5  CL 107 104  CO2 25 26  GLUCOSE 126* 91  BUN 28* 26*  CREATININE 1.10 1.06  CALCIUM 8.7* 8.1*  MG  --  1.8   GFR: Estimated Creatinine Clearance: 59.3 mL/min (by C-G formula based on SCr of 1.06 mg/dL). Liver Function Tests: No results for input(s): AST, ALT, ALKPHOS, BILITOT, PROT, ALBUMIN in the last 168 hours. No results for input(s): LIPASE, AMYLASE in the last 168 hours. No results for input(s): AMMONIA in the last 168 hours. Coagulation Profile: Recent Labs  Lab 03/05/20 0535  INR 1.1   Cardiac Enzymes: No results for input(s): CKTOTAL, CKMB, CKMBINDEX, TROPONINI in the last 168 hours. BNP (last 3 results) No results for input(s): PROBNP in the last 8760 hours. HbA1C: Recent Labs    03/06/20 0043  HGBA1C 5.4   CBG: No results for input(s): GLUCAP in the last 168 hours. Lipid Profile: Recent Labs    03/06/20 0043  CHOL 144  HDL 46  LDLCALC 86  TRIG 62  CHOLHDL 3.1   Thyroid Function Tests: No results for input(s): TSH, T4TOTAL, FREET4, T3FREE, THYROIDAB in the last 72 hours. Anemia Panel: No results for input(s): VITAMINB12, FOLATE, FERRITIN, TIBC, IRON, RETICCTPCT in the last 72 hours. Sepsis Labs: No results for input(s): PROCALCITON, LATICACIDVEN in the last 168 hours.  Recent Results (from the past 240 hour(s))  Respiratory Panel by RT PCR (Flu A&B, Covid) - Nasopharyngeal Swab     Status: None   Collection Time: 03/05/20  5:49 AM   Specimen: Nasopharyngeal Swab  Result Value Ref Range Status   SARS Coronavirus 2 by RT PCR NEGATIVE NEGATIVE Final    Comment: (NOTE) SARS-CoV-2 target nucleic acids are NOT DETECTED. The SARS-CoV-2 RNA is generally detectable in upper respiratoy specimens during the acute phase of infection. The lowest concentration of SARS-CoV-2 viral copies this assay can detect is 131 copies/mL. A negative result does not preclude SARS-Cov-2 infection and should not be used  as the sole basis for treatment or other patient management decisions. A negative result may occur with  improper specimen collection/handling, submission of specimen other than nasopharyngeal swab, presence of viral mutation(s) within the areas targeted by this assay, and inadequate number of viral copies (<131 copies/mL). A negative result must be combined with clinical observations, patient history, and epidemiological information. The expected result is Negative. Fact Sheet for Patients:  PinkCheek.be Fact Sheet for Healthcare Providers:  GravelBags.it This test is not yet ap proved or cleared by the [place] FDA and  has been authorized for detection and/or diagnosis of SARS-CoV-2 by FDA under an Emergency Use Authorization (EUA). This EUA will remain  in effect (meaning this test can be used) for the duration of the COVID-19 declaration under Section 564(b)(1) of the Act, 21 U.S.C. section 360bbb-3(b)(1), unless the authorization is terminated or revoked sooner.    Influenza A by PCR NEGATIVE NEGATIVE Final   Influenza B  by PCR NEGATIVE NEGATIVE Final    Comment: (NOTE) The Xpert Xpress SARS-CoV-2/FLU/RSV assay is intended as an aid in  the diagnosis of influenza from Nasopharyngeal swab specimens and  should not be used as a sole basis for treatment. Nasal washings and  aspirates are unacceptable for Xpert Xpress SARS-CoV-2/FLU/RSV  testing. Fact Sheet for Patients: PinkCheek.be Fact Sheet for Healthcare Providers: GravelBags.it This test is not yet approved or cleared by the [place] FDA and  has been authorized for detection and/or diagnosis of SARS-CoV-2 by  FDA under an Emergency Use Authorization (EUA). This EUA will remain  in effect (meaning this test can be used) for the duration of the  Covid-19 declaration under Section 564(b)(1) of the Act,  21  U.S.C. section 360bbb-3(b)(1), unless the authorization is  terminated or revoked. Performed at [facility], [address] [place], [place] [address]          Radiology Studies: CARDIAC CATHETERIZATION  Result Date: 03/07/2020  Ost RCA to Prox RCA lesion is 100% stenosed.  Dist RCA lesion is 100% stenosed.  Prox Cx to Mid Cx lesion is 99% stenosed.  Mid Cx lesion is 85% stenosed.  Prox Cx lesion is 85% stenosed.  1st Mrg lesion is 90% stenosed.  Prox LAD lesion is 50% stenosed.  1st Diag lesion is 90% stenosed.  Mid LAD-1 lesion is 85% stenosed.  Mid LAD-2 lesion is 85% stenosed.  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 with multiple coronary atherosclerosis of multiple areas Unable to cross valve due to significant stenoses and dilation of aortic root Assessment Acute non-ST elevation myocardial infarction Moderate to severe aortic valve stenosis Severe aortic root dilation Acute systolic dysfunction congestive heart failure Plan Continue diuresis for acute systolic dysfunction congestive heart failure secondary to above with hopeful resolution of pulmonary edema and hypoxia Beta-blocker aspirin and heparin for further treatment of acute systolic dysfunction heart failure non-ST elevation myocardial infarction Potential reassessment of aortic velocities after  improvements of above to assess true extent of aortic valve stenosis Further consideration of aortic root aortic valve and coronary artery bypass surgery when stabilization from above   DG Chest Port 1 View  Result Date: 03/07/2020 CLINICAL DATA:  CHF. Hypertension. EXAM: PORTABLE CHEST 1 VIEW COMPARISON:  CT 1 day prior FINDINGS: Midline trachea. Mild cardiomegaly. The previously described bilateral pleural effusions are not readily apparent. Mild pulmonary venous congestion, with improved interstitial edema. No lobar consolidation. Numerous leads and wires project over the chest. IMPRESSION: Cardiomegaly with mild pulmonary venous congestion, improved. Electronically Signed   By: KAbigail MiyamotoM.D.   On: 03/07/2020 08:21   ECHOCARDIOGRAM COMPLETE  Result Date: 03/06/2020    ECHOCARDIOGRAM REPORT   Patient Name:   MLOYED WILMESDate of Exam: 03/06/2020 Medical Rec #:  [id number]     Height:       70.0 in Accession #:    [id number]    Weight:  170.0 lb Date of Birth:  1941/11/25      BSA:          1.948 m Patient Age:    74 years       BP:           122/77 mmHg Patient Gender: M              HR:           87 bpm. Exam Location:  [facility] Procedure: 2D Echo, Color Doppler and Cardiac Doppler Indications:     R07.9 Chest Pain  History:         Patient has no prior history of Echocardiogram examinations.                  Signs/Symptoms:Shortness of Breath and Chest Pain; Risk                  Factors:Hypertension.  Sonographer:     Charmayne Sheer RDCS (AE) Referring Phys:  [id number] Soledad Gerlach NIU Diagnosing Phys: Serafina Royals MD  Sonographer Comments: Suboptimal parasternal window. IMPRESSIONS  1. Left ventricular ejection fraction, by estimation, is 35 to 40%. The left ventricle has moderately decreased function. The left ventricle demonstrates regional wall motion abnormalities (see scoring diagram/findings for description). Left ventricular  diastolic parameters were normal.  2. Right ventricular systolic function is  normal. The right ventricular size is normal.  3. Left atrial size was mildly dilated.  4. The mitral valve is normal in structure. Mild mitral valve regurgitation.  5. The aortic valve is normal in structure. Aortic valve regurgitation is trivial. Moderate aortic valve stenosis. FINDINGS  Left Ventricle: Left ventricular ejection fraction, by estimation, is 35 to 40%. The left ventricle has moderately decreased function. The left ventricle demonstrates regional wall motion abnormalities. Moderate hypokinesis of the left ventricular, mid-apical inferoseptal wall and anteroseptal wall. The left ventricular internal cavity size was normal in size. There is borderline left ventricular hypertrophy. Left ventricular diastolic parameters were normal. Right Ventricle: The right ventricular size is normal. No increase in right ventricular wall thickness. Right ventricular systolic function is normal. Left Atrium: Left atrial size was mildly dilated. Right Atrium: Right atrial size was normal in size. Pericardium: There is no evidence of pericardial effusion. Mitral Valve: The mitral valve is normal in structure. Mild mitral valve regurgitation. MV peak gradient, 5.1 mmHg. The mean mitral valve gradient is 2.0 mmHg. Tricuspid Valve: The tricuspid valve is normal in structure. Tricuspid valve regurgitation is mild. Aortic Valve: The aortic valve is normal in structure. Aortic valve regurgitation is trivial. Moderate aortic stenosis is present. Aortic valve mean gradient measures 19.0 mmHg. Aortic valve peak gradient measures 33.3 mmHg. Aortic valve area, by VTI measures 0.68 cm. Pulmonic Valve: The pulmonic valve was grossly normal. Pulmonic valve regurgitation is not visualized. Aorta: The aortic root and ascending aorta are structurally normal, with no evidence of dilitation. IAS/Shunts: No atrial level shunt detected by color flow Doppler.  LEFT VENTRICLE PLAX 2D LVIDd:         3.87 cm      Diastology LVIDs:         3.17  cm      LV e' lateral:   7.18 cm/s LV PW:         0.93 cm      LV E/e' lateral: 14.6 LV IVS:        0.87 cm      LV e' medial:    5.77 cm/s  LVOT diam:     2.20 cm      LV E/e' medial:  18.2 LV SV:         36 LV SV Index:   18 LVOT Area:     3.80 cm  LV Volumes (MOD) LV vol d, MOD A2C: 116.0 ml LV vol d, MOD A4C: 108.0 ml LV vol s, MOD A2C: 88.3 ml LV vol s, MOD A4C: 79.7 ml LV SV MOD A2C:     27.7 ml LV SV MOD A4C:     108.0 ml LV SV MOD BP:      32.2 ml LEFT ATRIUM           Index       RIGHT ATRIUM           Index LA diam:      4.20 cm 2.16 cm/m  RA Area:     14.60 cm LA Vol (A4C): 48.0 ml 24.64 ml/m RA Volume:   38.10 ml  19.56 ml/m  AORTIC VALVE                    PULMONIC VALVE AV Area (Vmax):    0.72 cm     PV Vmax:       0.67 m/s AV Area (Vmean):   0.70 cm     PV Vmean:      45.800 cm/s AV Area (VTI):     0.68 cm     PV VTI:        0.115 m AV Vmax:           288.67 cm/s  PV Peak grad:  1.8 mmHg AV Vmean:          198.667 cm/s PV Mean grad:  1.0 mmHg AV VTI:            0.524 m AV Peak Grad:      33.3 mmHg AV Mean Grad:      19.0 mmHg LVOT Vmax:         54.70 cm/s LVOT Vmean:        36.700 cm/s LVOT VTI:          0.094 m LVOT/AV VTI ratio: 0.18  AORTA Ao Root diam: 3.40 cm MITRAL VALVE MV Area (PHT): 5.05 cm     SHUNTS MV Peak grad:  5.1 mmHg     Systemic VTI:  0.09 m MV Mean grad:  2.0 mmHg     Systemic Diam: 2.20 cm MV Vmax:       1.13 m/s MV Vmean:      63.5 cm/s MV Decel Time: 150 msec MV E velocity: 105.17 cm/s MV A velocity: 56.90 cm/s MV E/A ratio:  1.85 Serafina Royals MD Electronically signed by Serafina Royals MD Signature Date/Time: 03/06/2020/7:09:42 PM    Final         Scheduled Meds: . aspirin EC  81 mg Oral Daily  . dextromethorphan-guaiFENesin  1 tablet Oral BID  . enoxaparin (LOVENOX) injection  40 mg Subcutaneous Q24H  . furosemide  40 mg Intravenous BID  . isosorbide mononitrate  30 mg Oral Daily  . metoprolol tartrate  50 mg Oral BID  . sodium chloride flush  3 mL  Intravenous Q12H   Continuous Infusions:   LOS: 3 days    Time spent: 26 minutes    Sharen Hones, MD [facility]   To contact the attending provider between 7A-7P or the covering provider during after hours 7P-7A, please log into  the web site [email or web address] and access using universal [place] password for that web site. If you do not have the password, please call the hospital operator.  03/08/2020, 11:59 AM

## 2020-03-08 NOTE — Evaluation (Signed)
Physical Therapy Evaluation Patient Details Name: Andre HALLOWAY Sr. MRN: [id number] DOB: 17-Aug-1941 Today's Date: 03/08/2020   History of Present Illness  Pt admitted for ARF with hypoxia and NSTEMI. Pt presents initially with complaints of SOB symptoms. Other PMH includes HTN, CAD and is no s/p endarterectomy. Pt is s/p cardiac cath yesterday. RN discontinued heparin IV this date and pt cleared to work with therapy.   Clinical Impression  Pt is a pleasant 79 year old male who was admitted for ARF with hypoxia and NSTEMI. Pt demonstrates all bed mobility/transfers/ambulation at baseline level. Vitals assessed during mobility attempts and all stable on RA. Educated/encouraged to continue mobility attempts with RN staff as able and to maintain current strength level as he is pending further cardiac interventions. Pt does not require any further PT needs at this time. Pt will be dc in house and does not require follow up. RN aware. Will dc current orders.     Follow Up Recommendations No PT follow up    Equipment Recommendations  None recommended by PT    Recommendations for Other Services       Precautions / Restrictions Precautions Precautions: None Restrictions Weight Bearing Restrictions: No      Mobility  Bed Mobility Overal bed mobility: Independent             General bed mobility comments: safe technique with ease of mobility  Transfers Overall transfer level: Independent Equipment used: None             General transfer comment: safe technique  Ambulation/Gait Ambulation/Gait assistance: Independent Gait Distance (Feet): 300 Feet Assistive device: None Gait Pattern/deviations: WFL(Within Functional Limits)     General Gait Details: ambulated several laps in hallway, monitoring vitals and symptoms. Pt reports no SOB symptoms. Able to turn head in all directions, maintain coversation during exertion and needed no rest breaks. O2 at 92% on RA post exertion  with HR increasing to 103bpm. No LOB noted and safe technique with reciprocal gait pattern.  Stairs            Wheelchair Mobility    Modified Rankin (Stroke Patients Only)       Balance Overall balance assessment: Independent                                           Pertinent Vitals/Pain Pain Assessment: No/denies pain    Home Living Family/patient expects to be discharged to:: Private residence Living Arrangements: Spouse/significant other Available Help at Discharge: Family Type of Home: House Home Access: Stairs to enter Entrance Stairs-Rails: None Entrance Stairs-Number of Steps: 2 Home Layout: Two level;Able to live on main level with bedroom/bathroom Home Equipment: None      Prior Function Level of Independence: Independent         Comments: very active walking 3-20miles a few days a week. Takes care of yardwork independently. Reports no falls     Hand Dominance        Extremity/Trunk Assessment   Upper Extremity Assessment Upper Extremity Assessment: Overall WFL for tasks assessed    Lower Extremity Assessment Lower Extremity Assessment: Overall WFL for tasks assessed       Communication   Communication: No difficulties  Cognition Arousal/Alertness: Awake/alert Behavior During Therapy: WFL for tasks assessed/performed Overall Cognitive Status: Within Functional Limits for tasks assessed  General Comments      Exercises     Assessment/Plan    PT Assessment Patent does not need any further PT services  PT Problem List         PT Treatment Interventions      PT Goals (Current goals can be found in the Care Plan section)  Acute Rehab PT Goals Patient Stated Goal: to go home PT Goal Formulation: All assessment and education complete, DC therapy Time For Goal Achievement: 03/08/20 Potential to Achieve Goals: Good    Frequency     Barriers to discharge         Co-evaluation               AM-PAC PT "6 Clicks" Mobility  Outcome Measure Help needed turning from your back to your side while in a flat bed without using bedrails?: None Help needed moving from lying on your back to sitting on the side of a flat bed without using bedrails?: None Help needed moving to and from a bed to a chair (including a wheelchair)?: None Help needed standing up from a chair using your arms (e.g., wheelchair or bedside chair)?: None Help needed to walk in hospital room?: None Help needed climbing 3-5 steps with a railing? : None 6 Click Score: 24    End of Session   Activity Tolerance: Patient tolerated treatment well Patient left: in chair Nurse Communication: Mobility status PT Visit Diagnosis: Muscle weakness (generalized) (M62.81)    Time: [id number] PT Time Calculation (min) (ACUTE ONLY): 24 min   Charges:   PT Evaluation $PT Eval Low Complexity: 1 Low PT Treatments $Gait Training: 8-22 mins        Greggory Stallion, PT, DPT [phone]   Andre Murphy 03/08/2020, 10:06 AM

## 2020-03-08 NOTE — Progress Notes (Signed)
[facility] Cardiology [facility] Encounter Note  Patient: Andre ALYEA Sr. / Admit Date: 03/05/2020 / Date of Encounter: 03/08/2020, 8:21 AM   Subjective: Patient feeling much better today.  No evidence of chest pain.  Patient rested well overnight.  Troponin elevation now up to 1630 consistent with non-ST elevation myocardial infarction.  Telemetry shows normal sinus rhythm.  Patient is remaining on appropriate medication management for non-ST elevation myocardial infarction  Echocardiogram showing moderate LV systolic dysfunction with septal distal anterior and apical hypokinesis with ejection fraction of 35% with Moderate aortic valve stenosis with 3.23 m/s velocity with a mean of 24 mm of a peak of 54 mm Cardiac catheterization showing significant diffuse three-vessel coronary artery disease with 90% mid LAD 90% and diffuse mid circumflex and 100% proximal right coronary artery stenosis Could not fully cross aortic valve for further pressures or assessment of aortic valve stenosis at this time Chest x-ray shows resolving pulmonary edema consistent with congestive heart failure CT does not assess aorta and may need to have a reread due to a sending aorta 5.6 cm Review of Systems: Positive for: None Negative for: Vision change, hearing change, syncope, dizziness, nausea, vomiting,diarrhea, bloody stool, stomach pain, cough, congestion, diaphoresis, urinary frequency, urinary pain,skin lesions, skin rashes Others previously listed  Objective: Telemetry: Normal sinus rhythm Physical Exam: Blood pressure 123/74, pulse 87, temperature 98.1 F (36.7 C), temperature source Oral, resp. rate 18, height 5\' 10"  (1.778 m), weight 81.9 kg, SpO2 96 %. Body mass index is 25.91 kg/m. General: Well developed, well nourished, in no acute distress. Head: Normocephalic, atraumatic, sclera non-icteric, no xanthomas, nares are without discharge. Neck: No apparent masses Lungs: Normal respirations with no  wheezes, no rhonchi, no rales , few basilar crackles   Heart: Regular rate and rhythm, normal S1 S2, 2-3+ aortic murmur, no rub, no gallop, PMI is normal size and placement, carotid upstroke normal without bruit, jugular venous pressure normal Abdomen: Soft, non-tender, non-distended with normoactive bowel sounds. No hepatosplenomegaly. Abdominal aorta is normal size without bruit Extremities: Trace edema, no clubbing, no cyanosis, no ulcers,  Peripheral: 2+ radial, 2+ femoral, 2+ dorsal pedal pulses Neuro: Alert and oriented. Moves all extremities spontaneously. Psych:  Responds to questions appropriately with a normal affect.   Intake/Output Summary (Last 24 hours) at 03/08/2020 0821 Last data filed at 03/08/2020 0600 Gross per 24 hour  Intake 861.55 ml  Output 950 ml  Net -88.45 ml    Inpatient Medications:  . aspirin EC  81 mg Oral Daily  . dextromethorphan-guaiFENesin  1 tablet Oral BID  . furosemide  40 mg Intravenous BID  . metoprolol tartrate  50 mg Oral BID  . sodium chloride flush  3 mL Intravenous Q12H   Infusions:  . heparin 1,950 Units/hr (03/08/20 0742)    Labs: Recent Labs    03/08/20 0009  NA 141  K 3.5  CL 104  CO2 26  GLUCOSE 91  BUN 26*  CREATININE 1.06  CALCIUM 8.1*  MG 1.8   No results for input(s): AST, ALT, ALKPHOS, BILITOT, PROT, ALBUMIN in the last 72 hours. Recent Labs    03/07/20 0506 03/08/20 0009  WBC 11.4* 9.5  HGB 12.5* 12.5*  HCT 37.2* 37.2*  MCV 89.2 88.4  PLT 208 240   No results for input(s): CKTOTAL, CKMB, TROPONINI in the last 72 hours. Invalid input(s): POCBNP Recent Labs    03/06/20 0043  HGBA1C 5.4     Weights: [employer]   03/05/20 0516 03/07/20  1958 03/08/20 0544  Weight: 77.1 kg 82.5 kg 81.9 kg     Radiology/Studies:  CT Angio Chest PE W/Cm &/Or Wo Cm  Result Date: 03/05/2020 CLINICAL DATA:  Shortness of breath EXAM: CT ANGIOGRAPHY CHEST WITH CONTRAST TECHNIQUE: Multidetector CT imaging of the chest was  performed using the standard protocol during bolus administration of intravenous contrast. Multiplanar CT image reconstructions and MIPs were obtained to evaluate the vascular anatomy. CONTRAST:  39mL OMNIPAQUE IOHEXOL 350 MG/ML SOLN COMPARISON:  None. FINDINGS: Cardiovascular: Satisfactory opacification of the pulmonary arteries to the proximal segmental level. No evidence of pulmonary embolism. Normal heart size. Reflux of contrast into the hepatic veins, which could reflect right heart dysfunction. Coronary artery and aortic calcifications. No pericardial effusion. Mediastinum/Nodes: Mildly prominent mediastinal and hilar nodes, likely reactive. Thyroid appears partially absent and not well evaluated due to artifact. Lungs/Pleura: Small bilateral pleural effusions and mild bibasilar atelectasis. Diffuse interlobular septal thickening. Mild peribronchial thickening. No pneumothorax. Upper Abdomen: Probable small hiatal hernia.  Small hepatic cysts. Musculoskeletal: No chest wall abnormality. Mild thoracic spine degenerative changes. No acute osseous abnormality. Review of the MIP images confirms the above findings. IMPRESSION: Suboptimal evaluation due to motion artifact. No evidence of acute pulmonary embolism. Interstitial pulmonary edema.  Small bilateral pleural effusions. Electronically Signed   By: Macy Mis M.D.   On: 03/05/2020 07:42   CARDIAC CATHETERIZATION  Result Date: 03/07/2020  Ost RCA to Prox RCA lesion is 100% stenosed.  Dist RCA lesion is 100% stenosed.  Prox Cx to Mid Cx lesion is 99% stenosed.  Mid Cx lesion is 85% stenosed.  Prox Cx lesion is 85% stenosed.  1st Mrg lesion is 90% stenosed.  Prox LAD lesion is 50% stenosed.  1st Diag lesion is 90% stenosed.  Mid LAD-1 lesion is 85% stenosed.  Mid LAD-2 lesion is 85% stenosed.  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 with multiple coronary atherosclerosis of multiple areas Unable to cross valve due to significant stenoses and dilation of aortic root Assessment Acute non-ST elevation myocardial infarction Moderate to severe aortic valve stenosis 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 Hypertension. EXAM: PORTABLE CHEST 1 VIEW COMPARISON:  CT 1 day prior FINDINGS: Midline trachea. Mild cardiomegaly. The previously described bilateral pleural effusions are not readily apparent. Mild pulmonary venous congestion, with improved interstitial edema. No lobar consolidation. Numerous leads and wires project over the chest. IMPRESSION: Cardiomegaly with mild pulmonary venous congestion, improved. Electronically Signed  By: Abigail Miyamoto M.D.   On: 03/07/2020 08:21   ECHOCARDIOGRAM COMPLETE  Result Date:  03/06/2020    ECHOCARDIOGRAM REPORT   Patient Name:   Andre Murphy Date of Exam: 03/06/2020 Medical Rec #:  [id number]      Height:       70.0 in Accession #:    [id number]     Weight:       170.0 lb Date of Birth:  1941-11-04      BSA:          1.948 m Patient Age:    26 years       BP:           122/77 mmHg Patient Gender: M              HR:           87 bpm. Exam Location:  [facility] Procedure: 2D Echo, Color Doppler and Cardiac Doppler Indications:     R07.9 Chest Pain  History:         Patient has no prior history of Echocardiogram examinations.                  Signs/Symptoms:Shortness of Breath and Chest Pain; Risk                  Factors:Hypertension.  Sonographer:     Charmayne Sheer RDCS (AE) Referring Phys:  [id number] Soledad Gerlach NIU Diagnosing Phys: Serafina Royals MD  Sonographer Comments: Suboptimal parasternal window. IMPRESSIONS  1. Left ventricular ejection fraction, by estimation, is 35 to 40%. The left ventricle has moderately decreased function. The left ventricle demonstrates regional wall motion abnormalities (see scoring diagram/findings for description). Left ventricular  diastolic parameters were normal.  2. Right ventricular systolic function is normal. The right ventricular size is normal.  3. Left atrial size was mildly dilated.  4. The mitral valve is normal in structure. Mild mitral valve regurgitation.  5. The aortic valve is normal in structure. Aortic valve regurgitation is trivial. Moderate aortic valve stenosis. FINDINGS  Left Ventricle: Left ventricular ejection fraction, by estimation, is 35 to 40%. The left ventricle has moderately decreased function. The left ventricle demonstrates regional wall motion abnormalities. Moderate hypokinesis of the left ventricular, mid-apical inferoseptal wall and anteroseptal wall. The left ventricular internal cavity size was normal in size. There is borderline left ventricular hypertrophy. Left ventricular diastolic parameters were normal. Right Ventricle: The  right ventricular size is normal. No increase in right ventricular wall thickness. Right ventricular systolic function is normal. Left Atrium: Left atrial size was mildly dilated. Right Atrium: Right atrial size was normal in size. Pericardium: There is no evidence of pericardial effusion. Mitral Valve: The mitral valve is normal in structure. Mild mitral valve regurgitation. MV peak gradient, 5.1 mmHg. The mean mitral valve gradient is 2.0 mmHg. Tricuspid Valve: The tricuspid valve is normal in structure. Tricuspid valve regurgitation is mild. Aortic Valve: The aortic valve is normal in structure. Aortic valve regurgitation is trivial. Moderate aortic stenosis is present. Aortic valve mean gradient measures 19.0 mmHg. Aortic valve peak gradient measures 33.3 mmHg. Aortic valve area, by VTI measures 0.68 cm. Pulmonic Valve: The pulmonic valve was grossly normal. Pulmonic valve regurgitation is not visualized. Aorta: The aortic root and ascending aorta are structurally normal, with no evidence of dilitation. IAS/Shunts: No atrial level shunt detected by color flow Doppler.  LEFT VENTRICLE PLAX 2D LVIDd:         3.87 cm  Diastology LVIDs:         3.17 cm      LV e' lateral:   7.18 cm/s LV PW:         0.93 cm      LV E/e' lateral: 14.6 LV IVS:        0.87 cm      LV e' medial:    5.77 cm/s LVOT diam:     2.20 cm      LV E/e' medial:  18.2 LV SV:         36 LV SV Index:   18 LVOT Area:     3.80 cm  LV Volumes (MOD) LV vol d, MOD A2C: 116.0 ml LV vol d, MOD A4C: 108.0 ml LV vol s, MOD A2C: 88.3 ml LV vol s, MOD A4C: 79.7 ml LV SV MOD A2C:     27.7 ml LV SV MOD A4C:     108.0 ml LV SV MOD BP:      32.2 ml LEFT ATRIUM           Index       RIGHT ATRIUM           Index LA diam:      4.20 cm 2.16 cm/m  RA Area:     14.60 cm LA Vol (A4C): 48.0 ml 24.64 ml/m RA Volume:   38.10 ml  19.56 ml/m  AORTIC VALVE                    PULMONIC VALVE AV Area (Vmax):    0.72 cm     PV Vmax:       0.67 m/s AV Area (Vmean):   0.70  cm     PV Vmean:      45.800 cm/s AV Area (VTI):     0.68 cm     PV VTI:        0.115 m AV Vmax:           288.67 cm/s  PV Peak grad:  1.8 mmHg AV Vmean:          198.667 cm/s PV Mean grad:  1.0 mmHg AV VTI:            0.524 m AV Peak Grad:      33.3 mmHg AV Mean Grad:      19.0 mmHg LVOT Vmax:         54.70 cm/s LVOT Vmean:        36.700 cm/s LVOT VTI:          0.094 m LVOT/AV VTI ratio: 0.18  AORTA Ao Root diam: 3.40 cm MITRAL VALVE MV Area (PHT): 5.05 cm     SHUNTS MV Peak grad:  5.1 mmHg     Systemic VTI:  0.09 m MV Mean grad:  2.0 mmHg     Systemic Diam: 2.20 cm MV Vmax:       1.13 m/s MV Vmean:      63.5 cm/s MV Decel Time: 150 msec MV E velocity: 105.17 cm/s MV A velocity: 56.90 cm/s MV E/A ratio:  1.85 Serafina Royals MD Electronically signed by Serafina Royals MD Signature Date/Time: 03/06/2020/7:09:42 PM    Final      Assessment and Recommendation  79 y.o. male 79 year old male with hypertension hyperlipidemia peripheral vascular disease with acute non-ST elevation myocardial infarction and moderate aortic valve stenosis with critical three-vessel coronary artery disease likely contributing to above with slow improvements at this time  1.  Continue heparin aspirin and addition of beta-blocker today for further risk reduction of myocardial infarction 2.  Continuation of Lasix for pulmonary edema congestive heart failure 3.  Further reassessment of Doppler of echocardiogram to assess extent of aortic valve stenosis 4.  Maximize medical management and recovery from non-ST elevation myocardial infarction with congestive heart failure and further consideration of coronary artery bypass surgery with or without aortic valve intervention depending on further evaluation of above 5.  Potential reassessment with CT angiogram to assess ascending aortic dilation or for reassessment prior CT angiogram performed on admission 6.  Nitrates for diffuse three-vessel coronary disease Signed, Serafina Royals M.D.  FACC

## 2020-03-08 NOTE — Progress Notes (Signed)
OT Cancellation Note  Patient Details Name: Andre CROUSE Sr. MRN: [id number] DOB: [birth year]   Cancelled Treatment:    Reason Eval/Treat Not Completed: OT screened, no needs identified, will sign off. Per PT, pt independent in all functional mobility this date. Upon arrival pt toileting independently including perihygiene and hand washing. OT provided IS. No further skilled acute OT needs identified. Please re-consult as needed.   Dessie Coma, M.S. OTR/L  03/08/20, 10:20 AM

## 2020-03-08 NOTE — Progress Notes (Signed)
Pulmonary Medicine          Date: 03/08/2020,   MRN# [id number] Andre Murphy Sr. 1940/12/24     AdmissionWeight: 77.1 kg                 CurrentWeight: 81.9 kg      CHIEF COMPLAINT:   Acute hypoxemic respiratory failure   SUBJECTIVE   Patient is sitting up in bed with wife present in room.   He is feeling well, no SOB, no CP, he is not requiring supplemental O2 and is able to speak in full sentences.    Plan for transfer for CABG when bed availability and medical optimization.  He is using incentive spirometer and is able to take tidal volume up to 1265ml.      He is   PAST MEDICAL HISTORY   Past Medical History:  Diagnosis Date  . Bursitis   . Elevated lipids   . Hypertension   . SOB (shortness of breath)      SURGICAL HISTORY   Past Surgical History:  Procedure Laterality Date  . CAROTID ENDARTERECTOMY Right   . COLONOSCOPY WITH PROPOFOL N/A 08/20/2016   Procedure: COLONOSCOPY WITH PROPOFOL;  Surgeon: Manya Silvas, MD;  Location: [facility] ENDOSCOPY;  Service: Endoscopy;  Laterality: N/A;  . HERNIA REPAIR     1970  (Bilateral Inguinal Hernia)  . LEFT HEART CATH AND CORONARY ANGIOGRAPHY N/A 03/07/2020   Procedure: LEFT HEART CATH AND CORONARY ANGIOGRAPHY;  Surgeon: Corey Skains, MD;  Location: [facility];  Service: Cardiovascular;  Laterality: N/A;  . SHOULDER INJECTION    . THYROIDECTOMY, PARTIAL    . VASCULAR SURGERY     RT Carotid Endarterectomy     FAMILY HISTORY   Family History  Problem Relation Age of Onset  . Stroke Mother   . Cancer Father      SOCIAL HISTORY   Social History   Tobacco Use  . Smoking status: Never Smoker  . Smokeless tobacco: Never Used  Substance Use Topics  . Alcohol use: No  . Drug use: No     MEDICATIONS    Home Medication:    Current Medication:  Current Facility-Administered Medications:  .  acetaminophen (TYLENOL) tablet 650 mg, 650 mg, Oral, Q4H PRN, Corey Skains, MD .  albuterol (PROVENTIL) (2.5 MG/3ML) 0.083% nebulizer solution 2.5 mg, 2.5 mg, Nebulization, Q4H PRN, Ivor Costa, MD .  aspirin EC tablet 81 mg, 81 mg, Oral, Daily, Ivor Costa, MD, 81 mg at 03/08/20 0914 .  dextromethorphan-guaiFENesin (MUCINEX DM) 30-600 MG per 12 hr tablet 1 tablet, 1 tablet, Oral, BID, Ivor Costa, MD, 1 tablet at 03/08/20 0914 .  enoxaparin (LOVENOX) injection 40 mg, 40 mg, Subcutaneous, Q24H, Zhang, Dekui, MD .  furosemide (LASIX) injection 40 mg, 40 mg, Intravenous, BID, Corey Skains, MD, 40 mg at 03/08/20 0913 .  isosorbide mononitrate (IMDUR) 24 hr tablet 30 mg, 30 mg, Oral, Daily, Corey Skains, MD, 30 mg at 03/08/20 0914 .  metoprolol tartrate (LOPRESSOR) tablet 50 mg, 50 mg, Oral, BID, Corey Skains, MD, 50 mg at 03/08/20 0914 .  morphine 2 MG/ML injection 1 mg, 1 mg, Intravenous, Q3H PRN, Ivor Costa, MD .  ondansetron [facility]) injection 4 mg, 4 mg, Intravenous, Q6H PRN, Corey Skains, MD .  sodium chloride flush (NS) 0.9 % injection 3 mL, 3 mL, Intravenous, Q12H, Corey Skains, MD, 3 mL at 03/07/20 1104 .  temazepam (  RESTORIL) capsule 15 mg, 15 mg, Oral, QHS PRN, Ivor Costa, MD, 15 mg at 03/07/20 2133    ALLERGIES   Crestor [rosuvastatin calcium] and Rosuvastatin     REVIEW OF SYSTEMS    Review of Systems:  Gen:  Denies  fever, sweats, chills weigh loss  HEENT: Denies blurred vision, double vision, ear pain, eye pain, hearing loss, nose bleeds, sore throat Cardiac:  No dizziness, chest pain or heaviness, chest tightness,edema Resp:   Denies cough or sputum porduction, shortness of breath,wheezing, hemoptysis,  Gi: Denies swallowing difficulty, stomach pain, nausea or vomiting, diarrhea, constipation, bowel incontinence Gu:  Denies bladder incontinence, burning urine Ext:   Denies Joint pain, stiffness or swelling Skin: Denies  skin rash, easy bruising or bleeding or hives Endoc:  Denies polyuria, polydipsia , polyphagia or  weight change Psych:   Denies depression, insomnia or hallucinations   Other:  All other systems negative   VS: BP 119/80 (BP Location: Left Arm)   Pulse 85   Temp 97.7 F (36.5 C) (Oral)   Resp 18   Ht 5\' 10"  (1.778 m)   Wt 81.9 kg   SpO2 97%   BMI 25.91 kg/m      PHYSICAL EXAM    GENERAL:NAD, no fevers, chills, no weakness no fatigue HEAD: Normocephalic, atraumatic.  EYES: Pupils equal, round, reactive to light. Extraocular muscles intact. No scleral icterus.  MOUTH: Moist mucosal membrane. Dentition intact. No abscess noted.  EAR, NOSE, THROAT: Clear without exudates. No external lesions.  NECK: Supple. No thyromegaly. No nodules. No JVD.  PULMONARY: clear to auscultation bilaterally  CARDIOVASCULAR: S1 and S2. Regular rate and rhythm. No murmurs, rubs, or gallops. No edema. Pedal pulses 2+ bilaterally.  GASTROINTESTINAL: Soft, nontender, nondistended. No masses. Positive bowel sounds. No hepatosplenomegaly.  MUSCULOSKELETAL: No swelling, clubbing, or edema. Range of motion full in all extremities.  NEUROLOGIC: Cranial nerves II through XII are intact. No gross focal neurological deficits. Sensation intact. Reflexes intact.  SKIN: No ulceration, lesions, rashes, or cyanosis. Skin warm and dry. Turgor intact.  PSYCHIATRIC: Mood, affect within normal limits. The patient is awake, alert and oriented x 3. Insight, judgment intact.       IMAGING    CT Angio Chest PE W/Cm &/Or Wo Cm  Result Date: 03/05/2020 CLINICAL DATA:  Shortness of breath EXAM: CT ANGIOGRAPHY CHEST WITH CONTRAST TECHNIQUE: Multidetector CT imaging of the chest was performed using the standard protocol during bolus administration of intravenous contrast. Multiplanar CT image reconstructions and MIPs were obtained to evaluate the vascular anatomy. CONTRAST:  37mL OMNIPAQUE IOHEXOL 350 MG/ML SOLN COMPARISON:  None. FINDINGS: Cardiovascular: Satisfactory opacification of the pulmonary arteries to the proximal  segmental level. No evidence of pulmonary embolism. Normal heart size. Reflux of contrast into the hepatic veins, which could reflect right heart dysfunction. Coronary artery and aortic calcifications. No pericardial effusion. Mediastinum/Nodes: Mildly prominent mediastinal and hilar nodes, likely reactive. Thyroid appears partially absent and not well evaluated due to artifact. Lungs/Pleura: Small bilateral pleural effusions and mild bibasilar atelectasis. Diffuse interlobular septal thickening. Mild peribronchial thickening. No pneumothorax. Upper Abdomen: Probable small hiatal hernia.  Small hepatic cysts. Musculoskeletal: No chest wall abnormality. Mild thoracic spine degenerative changes. No acute osseous abnormality. Review of the MIP images confirms the above findings. IMPRESSION: Suboptimal evaluation due to motion artifact. No evidence of acute pulmonary embolism. Interstitial pulmonary edema.  Small bilateral pleural effusions. Electronically Signed   By: Macy Mis M.D.   On: 03/05/2020 07:42   CARDIAC  CATHETERIZATION  Result Date: 03/07/2020  Ost RCA to Prox RCA lesion is 100% stenosed.  Dist RCA lesion is 100% stenosed.  Prox Cx to Mid Cx lesion is 99% stenosed.  Mid Cx lesion is 85% stenosed.  Prox Cx lesion is 85% stenosed.  1st Mrg lesion is 90% stenosed.  Prox LAD lesion is 50% stenosed.  1st Diag lesion is 90% stenosed.  Mid LAD-1 lesion is 85% stenosed.  Mid LAD-2 lesion is 85% stenosed.  79 year old male with hypertension hyperlipidemia and severe peripheral vascular disease with significant progression of shortness of breath and acute non-ST elevation myocardial infarction Echocardiogram showing moderate segmental LV systolic dysfunction with septal distal anterior and apical hypokinesis with ejection fraction of 35% Aortic valve velocities at 3.2 m/s with a mean of 24 mm of peak of 31mm consistent with moderate aortic valve stenosis This may be underestimated due to LV  systolic dysfunction 2 consistent with moderate to severe aortic valve stenosis Aortic root dilation of 5.6 cm by angiography Critical three-vessel coronary artery disease with multiple coronary atherosclerosis of multiple areas Unable to cross valve due to significant stenoses and dilation of aortic root Assessment Acute non-ST elevation myocardial infarction Moderate to severe aortic valve stenosis 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 Hypertension. EXAM: PORTABLE CHEST 1 VIEW COMPARISON:  CT 1 day prior FINDINGS: Midline trachea. Mild cardiomegaly. The previously described bilateral pleural effusions are not readily apparent. Mild pulmonary venous congestion, with improved interstitial edema. No lobar consolidation. Numerous leads and wires project over the chest. IMPRESSION: Cardiomegaly with mild pulmonary venous congestion, improved. Electronically Signed   By: Abigail Miyamoto M.D.   On: 03/07/2020 08:21   CT ANGIO CHEST AORTA W/CM &/OR WO/CM  Result Date: 03/08/2020 CLINICAL DATA:  Follow-up thoracic aortic aneurysm. EXAM: CT ANGIOGRAPHY CHEST WITH CONTRAST TECHNIQUE: Multidetector CT imaging of the chest was performed using the standard protocol during bolus administration of intravenous contrast. Multiplanar CT image reconstructions and MIPs were obtained to evaluate the  vascular anatomy. 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 No dissection. The branch vessels are patent. Moderate atherosclerotic calcifications at the origins. Moderate atherosclerotic disease involving the descending thoracic aorta with areas of calcified and noncalcified plaque. Scattered coronary artery calcifications are noted. Moderate calcifications are noted at the aortic valve suggesting aortic stenosis and poststenotic dilatation of the ascending aorta. Mediastinum/Nodes: No mediastinal or hilar mass or adenopathy. Small scattered sub 8 mm lymph nodes are noted. The esophagus is grossly normal. Lungs/Pleura: There are small bilateral pleural effusions with overlying atelectasis. No infiltrates or pulmonary edema. No worrisome pulmonary lesions or pulmonary nodules. The central tracheobronchial tree is unremarkable. Upper Abdomen: No significant upper abdominal findings. Scattered hepatic cysts are noted. Layering high attenuation material in the gallbladder could be sludge, stones or contrast material with vicarious excretion. Aortic and branch vessel calcifications are noted.  Musculoskeletal: No chest wall mass, supraclavicular or axillary adenopathy. 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 The right thyroid lobe is unremarkable. No significant bony findings. Review of the MIP images confirms the above findings. IMPRESSION: 1. 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 No dissection. 2. Moderate calcifications at the aortic valve suggesting aortic stenosis and poststenotic dilatation of the ascending aorta. 3. Small bilateral pleural effusions with overlying atelectasis. 4. No infiltrates or pulmonary edema. 5. Aortic atherosclerosis. Aortic Atherosclerosis (ICD10-I70.0). Aortic  Atherosclerosis (ICD10-I70.0). Electronically Signed   By: Marijo Sanes M.D.   On: 03/08/2020 14:53   ECHOCARDIOGRAM COMPLETE  Result Date: 03/06/2020    ECHOCARDIOGRAM REPORT   Patient Name:   Andre Murphy Date of Exam: 03/06/2020 Medical Rec #:  [id number]      Height:       70.0 in Accession #:    [id number]     Weight:       170.0 lb Date of Birth:  February 16, 1941      BSA:          1.948 m Patient Age:    79 years       BP:           122/77 mmHg Patient Gender: M              HR:           87 bpm. Exam Location:  [facility] Procedure: 2D Echo, Color Doppler and Cardiac Doppler Indications:     R07.9 Chest Pain  History:         Patient has no prior history of Echocardiogram examinations.                  Signs/Symptoms:Shortness of Breath and Chest Pain; Risk                  Factors:Hypertension.  Sonographer:     Charmayne Sheer RDCS (AE) Referring Phys:  [id number] Soledad Gerlach NIU Diagnosing Phys: Serafina Royals MD  Sonographer Comments: Suboptimal parasternal window. IMPRESSIONS  1. Left ventricular ejection fraction, by estimation, is 35 to 40%. The left ventricle has moderately decreased function. The left ventricle demonstrates regional wall motion abnormalities (see scoring diagram/findings for description). Left ventricular  diastolic parameters were normal.  2. Right ventricular systolic function is normal. The right ventricular size is normal.  3. Left atrial size was mildly dilated.  4. The mitral valve is normal in structure. Mild mitral valve regurgitation.  5. The aortic valve is normal in structure. Aortic valve regurgitation is trivial. Moderate aortic valve stenosis. FINDINGS  Left Ventricle: Left ventricular ejection fraction, by estimation, is 35 to 40%. The left ventricle has moderately decreased function. The left ventricle demonstrates regional wall motion abnormalities. Moderate hypokinesis of the left ventricular, mid-apical inferoseptal wall and anteroseptal wall. The left ventricular internal cavity size  was normal in size. There is borderline left ventricular hypertrophy. Left ventricular diastolic parameters were normal. Right Ventricle: The right ventricular size is normal. No increase in right ventricular wall thickness. Right ventricular systolic function is normal. Left Atrium: Left atrial size was mildly dilated. Right Atrium: Right atrial size was normal in size. Pericardium: There is no evidence of pericardial effusion. Mitral Valve: The mitral valve is normal in structure. Mild mitral valve regurgitation. MV peak gradient, 5.1 mmHg. The mean mitral valve gradient is 2.0 mmHg. Tricuspid Valve: The tricuspid valve is normal in structure. Tricuspid valve regurgitation is mild. Aortic  Valve: The aortic valve is normal in structure. Aortic valve regurgitation is trivial. Moderate aortic stenosis is present. Aortic valve mean gradient measures 19.0 mmHg. Aortic valve peak gradient measures 33.3 mmHg. Aortic valve area, by VTI measures 0.68 cm. Pulmonic Valve: The pulmonic valve was grossly normal. Pulmonic valve regurgitation is not visualized. Aorta: The aortic root and ascending aorta are structurally normal, with no evidence of dilitation. IAS/Shunts: No atrial level shunt detected by color flow Doppler.  LEFT VENTRICLE PLAX 2D LVIDd:         3.87 cm      Diastology LVIDs:         3.17 cm      LV e' lateral:   7.18 cm/s LV PW:         0.93 cm      LV E/e' lateral: 14.6 LV IVS:        0.87 cm      LV e' medial:    5.77 cm/s LVOT diam:     2.20 cm      LV E/e' medial:  18.2 LV SV:         36 LV SV Index:   18 LVOT Area:     3.80 cm  LV Volumes (MOD) LV vol d, MOD A2C: 116.0 ml LV vol d, MOD A4C: 108.0 ml LV vol s, MOD A2C: 88.3 ml LV vol s, MOD A4C: 79.7 ml LV SV MOD A2C:     27.7 ml LV SV MOD A4C:     108.0 ml LV SV MOD BP:      32.2 ml LEFT ATRIUM           Index       RIGHT ATRIUM           Index LA diam:      4.20 cm 2.16 cm/m  RA Area:     14.60 cm LA Vol (A4C): 48.0 ml 24.64 ml/m RA Volume:   38.10  ml  19.56 ml/m  AORTIC VALVE                    PULMONIC VALVE AV Area (Vmax):    0.72 cm     PV Vmax:       0.67 m/s AV Area (Vmean):   0.70 cm     PV Vmean:      45.800 cm/s AV Area (VTI):     0.68 cm     PV VTI:        0.115 m AV Vmax:           288.67 cm/s  PV Peak grad:  1.8 mmHg AV Vmean:          198.667 cm/s PV Mean grad:  1.0 mmHg AV VTI:            0.524 m AV Peak Grad:      33.3 mmHg AV Mean Grad:      19.0 mmHg LVOT Vmax:         54.70 cm/s LVOT Vmean:        36.700 cm/s LVOT VTI:          0.094 m LVOT/AV VTI ratio: 0.18  AORTA Ao Root diam: 3.40 cm MITRAL VALVE MV Area (PHT): 5.05 cm     SHUNTS MV Peak grad:  5.1 mmHg     Systemic VTI:  0.09 m MV Mean grad:  2.0 mmHg     Systemic Diam: 2.20 cm MV Vmax:  1.13 m/s MV Vmean:      63.5 cm/s MV Decel Time: 150 msec MV E velocity: 105.17 cm/s MV A velocity: 56.90 cm/s MV E/A ratio:  1.85 Serafina Royals MD Electronically signed by Serafina Royals MD Signature Date/Time: 03/06/2020/7:09:42 PM    Final       ASSESSMENT/PLAN    Acute hypoxemic respiratory failure   - patient had NSTEMI , and folllow up TTE with systolic CHF EF [id number]   -CT chest with interstitial thickening from pulmonary edema and bilateral pleural effusions  - agree with cardiology workup. His LHC is abnormal with multi vessel occlusive disease and plan for CABG.   - agree with diuresis-net negative 2162  - patient is currently able to speak in full sentences and shares the SOB has improved signficantly  -patient is a never smoker no hx of COPD or emphysema  -patient is not on any pulmonary toxic medications -mediastinal and hilar lymphadenopathy is likely due to congestive heart failure with will decrease with diuresis.  -Additional evaluation in pulmonary clinic post D/C -appreciate cardiology and hospitalist service for collaboration in patient care.     Thank you for allowing me to participate in the care of this patient.   Patient/Family are satisfied with care  plan and all questions have been answered.   This document was prepared using Dragon voice recognition software and may include unintentional dictation errors.     Ottie Glazier, M.D.  Division of [place]

## 2020-03-08 NOTE — Progress Notes (Signed)
ANTICOAGULATION CONSULT NOTE  Pharmacy Consult for heparin Indication: chest pain/ACS  Patient Measurements: Height: 5\' 10"  (177.8 cm) Weight: 82.5 kg (181 lb 12.8 oz) IBW/kg (Calculated) : 73 Heparin Dosing Weight: 77.1 kg  Vital Signs: Temp: 97.9 F (36.6 C) (04/12 1958) Temp Source: Oral (04/12 1958) BP: 123/77 (04/12 1958) Pulse Rate: 91 (04/12 1958)  Labs: Recent Labs    03/05/20 0535 03/05/20 0537 03/05/20 0537 03/05/20 0901 03/05/20 1540 03/05/20 1540 03/05/20 2005 03/06/20 0038 03/06/20 0043 03/06/20 0043 03/06/20 0920 03/07/20 0058 03/07/20 0506 03/08/20 0009  HGB  --  14.1   < >  --   --   --   --   --  13.3   < >  --   --  12.5* 12.5*  HCT  --  40.9   < >  --   --   --   --   --  39.9  --   --   --  37.2* 37.2*  PLT  --  199   < >  --   --   --   --   --  203  --   --   --  208 240  APTT 36  --   --   --   --   --   --   --   --   --   --   --   --   --   LABPROT 14.1  --   --   --   --   --   --   --   --   --   --   --   --   --   INR 1.1  --   --   --   --   --   --   --   --   --   --   --   --   --   HEPARINUNFRC  --   --   --   --  0.44   < >  --  <0.10*  --   --    < > 0.30 0.21* 0.24*  CREATININE  --  1.10  --   --   --   --   --   --   --   --   --   --   --   --   TROPONINIHS  --  1,053*  --    < > 1,630*  --  1,510* 1,501*  --   --   --   --   --   --    < > = values in this interval not displayed.    Estimated Creatinine Clearance: 57.1 mL/min (by C-G formula based on SCr of 1.1 mg/dL).   Medical History: Past Medical History:  Diagnosis Date  . Bursitis   . Elevated lipids   . Hypertension   . SOB (shortness of breath)     Medications:  Scheduled:  . aspirin EC  81 mg Oral Daily  . dextromethorphan-guaiFENesin  1 tablet Oral BID  . furosemide  40 mg Intravenous BID  . metoprolol tartrate  50 mg Oral BID  . sodium chloride flush  3 mL Intravenous Q12H    Assessment: Patient arrives w/ SOB w/ on 86% RA placed on 2L O2, initial  trop of 1053, EKG showing ST depression in aVL, and T wave abnormalities. No anticoagulation PTA. Patient is being started on heparin drip for management of NSTEMI.  Baseline CBC WNL, aPTT/INR pending. The most recent anti-Xa level from this morning was subtherapeutic. Later in the morning at 1219 heparin was held for a cardiac catheterization. We have orders to restart the heparin 2 hours after sheath removal (1340)  Goal of Therapy:  Heparin level 0.3-0.7 units/ml Monitor platelets by anticoagulation protocol: Yes   Plan:   Restart heparin at 1750 units/hr beginning at 1540 (no bolus)  recheck anti-Xa level 8 hours after restarting  CBC in am  0413 0009 HL 0.24, subtherapeutic. CBC stable.  Will increase Heparin drip to 1950 units/hr and recheck HL ~ 8 hours after rate increase.  Ena Dawley, PharmD Clinical Pharmacist 03/08/2020 2:02 AM

## 2020-03-09 DIAGNOSIS — I5021 Acute systolic (congestive) heart failure: Secondary | ICD-10-CM

## 2020-03-09 DIAGNOSIS — I214 Non-ST elevation (NSTEMI) myocardial infarction: Secondary | ICD-10-CM | POA: Diagnosis not present

## 2020-03-09 DIAGNOSIS — I712 Thoracic aortic aneurysm, without rupture, unspecified: Secondary | ICD-10-CM

## 2020-03-09 DIAGNOSIS — J9601 Acute respiratory failure with hypoxia: Secondary | ICD-10-CM | POA: Diagnosis not present

## 2020-03-09 DIAGNOSIS — I10 Essential (primary) hypertension: Secondary | ICD-10-CM | POA: Diagnosis not present

## 2020-03-09 LAB — CBC
HCT: 37.1 % — ABNORMAL LOW (ref 39.0–52.0)
Hemoglobin: 12.6 g/dL — ABNORMAL LOW (ref 13.0–17.0)
MCH: 29.9 pg (ref 26.0–34.0)
MCHC: 34 g/dL (ref 30.0–36.0)
MCV: 87.9 fL (ref 80.0–100.0)
Platelets: 229 10*3/uL (ref 150–400)
RBC: 4.22 MIL/uL (ref 4.22–5.81)
RDW: 12.4 % (ref 11.5–15.5)
WBC: 7.8 10*3/uL (ref 4.0–10.5)
nRBC: 0 % (ref 0.0–0.2)

## 2020-03-09 NOTE — Progress Notes (Signed)
Patient ID: Andre KULISH Sr., male   DOB: 1941/02/04, 79 y.o.   MRN: [id number] [facility] PROGRESS NOTE  Andre PAKKALA Sr. [id number] DOB: [birth year] DOA: 03/05/2020 PCP: [place] Pink, MD  HPI/Subjective: Patient feeling fine.  Offers no complaints of chest pain or shortness of breath.  Seen walking around the hallway.  Objective: Vitals:   03/09/20 0744 03/09/20 1224  BP: 128/70 111/82  Pulse: 83 96  Resp: 16 17  Temp: 98.2 F (36.8 C) 98.4 F (36.9 C)  SpO2: 92% 97%    Intake/Output Summary (Last 24 hours) at 03/09/2020 1409 Last data filed at 03/09/2020 0606 Gross per 24 hour  Intake 360 ml  Output 1650 ml  Net -1290 ml   Filed Weights   03/07/20 1958 03/08/20 0544 03/09/20 0604  Weight: 82.5 kg 81.9 kg 79.8 kg    ROS: Review of Systems  Constitutional: Negative for chills and fever.  Eyes: Negative for blurred vision.  Respiratory: Negative for cough and shortness of breath.   Cardiovascular: Negative for chest pain.  Gastrointestinal: Negative for abdominal pain, constipation, diarrhea, nausea and vomiting.  Genitourinary: Negative for dysuria.  Musculoskeletal: Negative for joint pain.  Neurological: Negative for dizziness and headaches.   Exam: Physical Exam  Constitutional: He is oriented to person, place, and time.  HENT:  Nose: No mucosal edema.  Mouth/Throat: No oropharyngeal exudate or posterior oropharyngeal edema.  Eyes: Conjunctivae and lids are normal.  Neck: Carotid bruit is not present.  Cardiovascular: S1 normal and S2 normal. Exam reveals no gallop.  Murmur heard.  Systolic murmur is present with a grade of 2/6. Respiratory: No respiratory distress. He has no wheezes. He has no rhonchi. He has no rales.  GI: Soft. Bowel sounds are normal. There is no abdominal tenderness.  Musculoskeletal:     Right ankle: No swelling.     Left ankle: No swelling.  Lymphadenopathy:    He has no cervical adenopathy.  Neurological: He is alert  and oriented to person, place, and time. No cranial nerve deficit.  Skin: Skin is warm. No rash noted. Nails show no clubbing.  Psychiatric: He has a normal mood and affect.      Data Reviewed: Basic Metabolic Panel: Recent Labs  Lab 03/05/20 0537 03/08/20 0009  NA 139 141  K 4.2 3.5  CL 107 104  CO2 25 26  GLUCOSE 126* 91  BUN 28* 26*  CREATININE 1.10 1.06  CALCIUM 8.7* 8.1*  MG  --  1.8   CBC: Recent Labs  Lab 03/05/20 0537 03/06/20 0043 03/07/20 0506 03/08/20 0009 03/09/20 0455  WBC 14.8* 15.1* 11.4* 9.5 7.8  NEUTROABS 12.5*  --   --   --   --   HGB 14.1 13.3 12.5* 12.5* 12.6*  HCT 40.9 39.9 37.2* 37.2* 37.1*  MCV 87.6 89.3 89.2 88.4 87.9  PLT 199 203 208 240 229   BNP (last 3 results) Recent Labs    03/05/20 0535  BNP 657.0*     Recent Results (from the past 240 hour(s))  Respiratory Panel by RT PCR (Flu A&B, Covid) - Nasopharyngeal Swab     Status: None   Collection Time: 03/05/20  5:49 AM   Specimen: Nasopharyngeal Swab  Result Value Ref Range Status   SARS Coronavirus 2 by RT PCR NEGATIVE NEGATIVE Final    Comment: (NOTE) SARS-CoV-2 target nucleic acids are NOT DETECTED. The SARS-CoV-2 RNA is generally detectable in upper respiratoy specimens during the acute phase of infection. The  lowest concentration of SARS-CoV-2 viral copies this assay can detect is 131 copies/mL. A negative result does not preclude SARS-Cov-2 infection and should not be used as the sole basis for treatment or other patient management decisions. A negative result may occur with  improper specimen collection/handling, submission of specimen other than nasopharyngeal swab, presence of viral mutation(s) within the areas targeted by this assay, and inadequate number of viral copies (<131 copies/mL). A negative result must be combined with clinical observations, patient history, and epidemiological information. The expected result is Negative. Fact Sheet for Patients:   PinkCheek.be Fact Sheet for Healthcare Providers:  GravelBags.it This test is not yet ap proved or cleared by the [place] FDA and  has been authorized for detection and/or diagnosis of SARS-CoV-2 by FDA under an Emergency Use Authorization (EUA). This EUA will remain  in effect (meaning this test can be used) for the duration of the COVID-19 declaration under Section 564(b)(1) of the Act, 21 U.S.C. section 360bbb-3(b)(1), unless the authorization is terminated or revoked sooner.    Influenza A by PCR NEGATIVE NEGATIVE Final   Influenza B by PCR NEGATIVE NEGATIVE Final    Comment: (NOTE) The Xpert Xpress SARS-CoV-2/FLU/RSV assay is intended as an aid in  the diagnosis of influenza from Nasopharyngeal swab specimens and  should not be used as a sole basis for treatment. Nasal washings and  aspirates are unacceptable for Xpert Xpress SARS-CoV-2/FLU/RSV  testing. Fact Sheet for Patients: PinkCheek.be Fact Sheet for Healthcare Providers: GravelBags.it This test is not yet approved or cleared by the [place] FDA and  has been authorized for detection and/or diagnosis of SARS-CoV-2 by  FDA under an Emergency Use Authorization (EUA). This EUA will remain  in effect (meaning this test can be used) for the duration of the  Covid-19 declaration under Section 564(b)(1) of the Act, 21  U.S.C. section 360bbb-3(b)(1), unless the authorization is  terminated or revoked. Performed at [facility], [address] [place], [place] [address]      Studies: CT ANGIO CHEST AORTA W/CM &/OR WO/CM  Result Date: 03/08/2020 CLINICAL DATA:  Follow-up thoracic aortic aneurysm. EXAM: CT ANGIOGRAPHY CHEST WITH CONTRAST TECHNIQUE: Multidetector CT imaging of the chest was performed using the standard protocol during bolus administration of intravenous contrast. Multiplanar CT  image reconstructions and MIPs were obtained to evaluate the vascular anatomy. 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 No dissection. The branch vessels are patent. Moderate atherosclerotic calcifications at the origins. Moderate atherosclerotic disease involving the descending thoracic aorta with areas of calcified and noncalcified plaque. Scattered coronary artery calcifications are noted. Moderate calcifications are noted at the aortic valve suggesting aortic stenosis and poststenotic dilatation of the ascending aorta. Mediastinum/Nodes: No mediastinal or hilar mass or adenopathy. Small scattered sub 8 mm lymph nodes are noted. The esophagus is grossly normal. Lungs/Pleura: There are small bilateral pleural effusions with overlying atelectasis. No infiltrates or pulmonary edema. No worrisome pulmonary lesions or pulmonary nodules. The central tracheobronchial tree is unremarkable. Upper Abdomen: No significant upper abdominal findings. Scattered hepatic cysts are noted. Layering high attenuation material in the gallbladder could be sludge, stones or contrast material with vicarious excretion. Aortic and branch vessel calcifications are noted. Musculoskeletal: No chest wall mass, supraclavicular or axillary adenopathy. 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 The right thyroid lobe is unremarkable. No significant bony findings. Review of  the MIP images confirms the above findings. IMPRESSION: 1. 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 No dissection. 2. Moderate calcifications at the aortic valve suggesting aortic stenosis and poststenotic dilatation of the ascending aorta. 3. Small bilateral pleural effusions with overlying atelectasis. 4. No infiltrates or pulmonary edema. 5. Aortic  atherosclerosis. Aortic Atherosclerosis (ICD10-I70.0). Aortic Atherosclerosis (ICD10-I70.0). Electronically Signed   By: Marijo Sanes M.D.   On: 03/08/2020 14:53    Scheduled Meds: . aspirin EC  81 mg Oral Daily  . dextromethorphan-guaiFENesin  1 tablet Oral BID  . enoxaparin (LOVENOX) injection  40 mg Subcutaneous Q24H  . furosemide  40 mg Intravenous BID  . isosorbide mononitrate  30 mg Oral Daily  . metoprolol tartrate  50 mg Oral BID  . sodium chloride flush  3 mL Intravenous Q12H    Assessment/Plan:  1. NSTEMI.  Cardiac cath shows multivessel disease.  Patient on aspirin, metoprolol and Imdur.  Patient has allergy to Crestor.  Cardiology to decide whether to transfer over to [facility] or discharge home. 2. Acute hypoxic respiratory failure this has resolved. 3. 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  Patient on IV Lasix. 4. Essential hypertension on metoprolol and Lasix. 5. Thoracic aneurysm.  Will need monitoring as outpatient.  Code Status:     Code Status Orders  (From admission, onward)         Start     Ordered   03/07/20 1541  Full code  Continuous     03/07/20 1540        Code Status History    Date Active Date Inactive Code Status Order ID Comments User Context   03/05/2020 1559 03/07/2020 1541 Full Code [id number]  Ivor Costa, MD ED   Advance Care Planning Activity     Family Communication: Left message for wife. Disposition Plan: Cardiology to determine on whether or not the patient can go home tomorrow with medical management or needs transfer to [facility] for CABG.  Consultants:  Cardiology  Time spent: 28 minutes  [place]

## 2020-03-09 NOTE — Plan of Care (Signed)
  Problem: Clinical Measurements: Goal: Ability to maintain clinical measurements within normal limits will improve Outcome: Progressing   Problem: Clinical Measurements: Goal: Will remain free from infection Outcome: Progressing   Problem: Clinical Measurements: Goal: Diagnostic test results will improve Outcome: Progressing   

## 2020-03-09 NOTE — Progress Notes (Signed)
[facility] Card[facility] Encounter Note  Patient: Andre HEMANI Sr. / Admit Date: 03/05/2020 / Date of Encounter: 03/09/2020, 8:50 AM   Subjective: Patient feeling much better today.  No evidence of chest pain.  Patient rested well overnight.  Troponin elevation now up to 1630 consistent with non-ST elevation myocardial infarction.  Telemetry shows normal sinus rhythm.  Patient is remaining on appropriate medication management for non-ST elevation myocardial infarction  Echocardiogram showing moderate LV systolic dysfunction with septal distal anterior and apical hypokinesis with ejection fraction of 35% with Moderate aortic valve stenosis with 3.23 m/s velocity with a mean of 24 mm of a peak of 54 mm Cardiac catheterization showing significant diffuse three-vessel coronary artery disease with 90% mid LAD 90% and diffuse mid circumflex and 100% proximal right coronary artery stenosis Could not fully cross aortic valve for further pressures or assessment of aortic valve stenosis at this time Chest x-ray shows resolving pulmonary edema consistent with congestive heart failure CT does not assess aorta and may need to have a reread due to a sending aorta 4.6 cm with post stenotic dilation from aortic valve stenosis Review of Systems: Positive for: None Negative for: Vision change, hearing change, syncope, dizziness, nausea, vomiting,diarrhea, bloody stool, stomach pain, cough, congestion, diaphoresis, urinary frequency, urinary pain,skin lesions, skin rashes Others previously listed  Objective: Telemetry: Normal sinus rhythm Physical Exam: Blood pressure 128/70, pulse 83, temperature 98.2 F (36.8 C), resp. rate 16, height 5\' 10"  (1.778 m), weight 79.8 kg, SpO2 92 %. Body mass index is 25.25 kg/m. General: Well developed, well nourished, in no acute distress. Head: Normocephalic, atraumatic, sclera non-icteric, no xanthomas, nares are without discharge. Neck: No apparent masses Lungs:  Normal respirations with no wheezes, no rhonchi, no rales , few basilar crackles   Heart: Regular rate and rhythm, normal S1 S2, 2-3+ aortic murmur, no rub, no gallop, PMI is normal size and placement, carotid upstroke normal without bruit, jugular venous pressure normal Abdomen: Soft, non-tender, non-distended with normoactive bowel sounds. No hepatosplenomegaly. Abdominal aorta is normal size without bruit Extremities: Trace edema, no clubbing, no cyanosis, no ulcers,  Peripheral: 2+ radial, 2+ femoral, 2+ dorsal pedal pulses Neuro: Alert and oriented. Moves all extremities spontaneously. Psych:  Responds to questions appropriately with a normal affect.   Intake/Output Summary (Last 24 hours) at 03/09/2020 0850 Last data filed at 03/09/2020 0606 Gross per 24 hour  Intake 600 ml  Output 2525 ml  Net -1925 ml    Inpatient Medications:  . aspirin EC  81 mg Oral Daily  . dextromethorphan-guaiFENesin  1 tablet Oral BID  . enoxaparin (LOVENOX) injection  40 mg Subcutaneous Q24H  . furosemide  40 mg Intravenous BID  . isosorbide mononitrate  30 mg Oral Daily  . metoprolol tartrate  50 mg Oral BID  . sodium chloride flush  3 mL Intravenous Q12H   Infusions:    Labs: Recent Labs    03/08/20 0009  NA 141  K 3.5  CL 104  CO2 26  GLUCOSE 91  BUN 26*  CREATININE 1.06  CALCIUM 8.1*  MG 1.8   No results for input(s): AST, ALT, ALKPHOS, BILITOT, PROT, ALBUMIN in the last 72 hours. Recent Labs    03/08/20 0009 03/09/20 0455  WBC 9.5 7.8  HGB 12.5* 12.6*  HCT 37.2* 37.1*  MCV 88.4 87.9  PLT 240 229   No results for input(s): CKTOTAL, CKMB, TROPONINI in the last 72 hours. Invalid input(s): POCBNP No results for input(s): HGBA1C  in the last 72 hours.   Weights: Filed Weights   03/07/20 1958 03/08/20 0544 03/09/20 0604  Weight: 82.5 kg 81.9 kg 79.8 kg     Radiology/Studies:  CT Angio Chest PE W/Cm &/Or Wo Cm  Result Date: 03/05/2020 CLINICAL DATA:  Shortness of breath  EXAM: CT ANGIOGRAPHY CHEST WITH CONTRAST TECHNIQUE: Multidetector CT imaging of the chest was performed using the standard protocol during bolus administration of intravenous contrast. Multiplanar CT image reconstructions and MIPs were obtained to evaluate the vascular anatomy. CONTRAST:  73mL OMNIPAQUE IOHEXOL 350 MG/ML SOLN COMPARISON:  None. FINDINGS: Cardiovascular: Satisfactory opacification of the pulmonary arteries to the proximal segmental level. No evidence of pulmonary embolism. Normal heart size. Reflux of contrast into the hepatic veins, which could reflect right heart dysfunction. Coronary artery and aortic calcifications. No pericardial effusion. Mediastinum/Nodes: Mildly prominent mediastinal and hilar nodes, likely reactive. Thyroid appears partially absent and not well evaluated due to artifact. Lungs/Pleura: Small bilateral pleural effusions and mild bibasilar atelectasis. Diffuse interlobular septal thickening. Mild peribronchial thickening. No pneumothorax. Upper Abdomen: Probable small hiatal hernia.  Small hepatic cysts. Musculoskeletal: No chest wall abnormality. Mild thoracic spine degenerative changes. No acute osseous abnormality. Review of the MIP images confirms the above findings. IMPRESSION: Suboptimal evaluation due to motion artifact. No evidence of acute pulmonary embolism. Interstitial pulmonary edema.  Small bilateral pleural effusions. Electronically Signed   By: Macy Mis M.D.   On: 03/05/2020 07:42   CARDIAC CATHETERIZATION  Result Date: 03/07/2020  Ost RCA to Prox RCA lesion is 100% stenosed.  Dist RCA lesion is 100% stenosed.  Prox Cx to Mid Cx lesion is 99% stenosed.  Mid Cx lesion is 85% stenosed.  Prox Cx lesion is 85% stenosed.  1st Mrg lesion is 90% stenosed.  Prox LAD lesion is 50% stenosed.  1st Diag lesion is 90% stenosed.  Mid LAD-1 lesion is 85% stenosed.  Mid LAD-2 lesion is 85% stenosed.  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 with multiple coronary atherosclerosis of multiple areas Unable to cross valve due to significant stenoses and dilation of aortic root Assessment Acute non-ST elevation myocardial infarction Moderate to severe aortic valve stenosis 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 Hypertension. EXAM: PORTABLE CHEST 1 VIEW COMPARISON:  CT 1 day prior FINDINGS: Midline trachea. Mild cardiomegaly. The previously described bilateral pleural effusions are not readily apparent. Mild pulmonary venous congestion, with improved interstitial edema. No lobar consolidation. Numerous leads and wires project  over the chest. IMPRESSION: Cardiomegaly with mild pulmonary venous congestion, improved. Electronically  Signed   By: Abigail Miyamoto M.D.   On: 03/07/2020 08:21   CT ANGIO CHEST AORTA W/CM &/OR WO/CM  Result Date: 03/08/2020 CLINICAL DATA:  Follow-up thoracic aortic aneurysm. EXAM: CT ANGIOGRAPHY CHEST WITH CONTRAST TECHNIQUE: Multidetector CT imaging of the chest was performed using the standard protocol during bolus administration of intravenous contrast. Multiplanar CT image reconstructions and MIPs were obtained to evaluate the vascular anatomy. 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 No dissection. The branch vessels are patent. Moderate atherosclerotic calcifications at the origins. Moderate atherosclerotic disease involving the descending thoracic aorta with areas of calcified and noncalcified plaque. Scattered coronary artery calcifications are noted. Moderate calcifications are noted at the aortic valve suggesting aortic stenosis and poststenotic dilatation of the ascending aorta. Mediastinum/Nodes: No mediastinal or hilar mass or adenopathy. Small scattered sub 8 mm lymph nodes are noted. The esophagus is grossly normal. Lungs/Pleura: There are small bilateral pleural effusions with overlying atelectasis. No infiltrates or pulmonary edema. No worrisome pulmonary lesions or pulmonary nodules. The central tracheobronchial tree is unremarkable. Upper Abdomen: No significant upper abdominal findings. Scattered hepatic cysts are noted. Layering high attenuation material in the gallbladder could be sludge, stones or contrast material with vicarious excretion. Aortic and branch vessel calcifications are noted. Musculoskeletal: No chest wall mass, supraclavicular or axillary adenopathy. 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 The right thyroid lobe is unremarkable. No significant bony findings. Review of the MIP images confirms the above findings.  IMPRESSION: 1. 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 No dissection. 2. Moderate calcifications at the aortic valve suggesting aortic stenosis and poststenotic dilatation of the ascending aorta. 3. Small bilateral pleural effusions with overlying atelectasis. 4. No infiltrates or pulmonary edema. 5. Aortic atherosclerosis. Aortic Atherosclerosis (ICD10-I70.0). Aortic Atherosclerosis (ICD10-I70.0). Electronically Signed   By: Marijo Sanes M.D.   On: 03/08/2020 14:53   ECHOCARDIOGRAM COMPLETE  Result Date: 03/06/2020    ECHOCARDIOGRAM REPORT   Patient Name:   Andre Murphy Date of Exam: 03/06/2020 Medical Rec #:  [id number]      Height:       70.0 in Accession #:    [id number]     Weight:       170.0 lb Date of Birth:  11-25-1941      BSA:          1.948 m Patient Age:    43 years       BP:           122/77 mmHg Patient Gender: M              HR:           87 bpm. Exam Location:  [facility] Procedure: 2D Echo, Color Doppler and Cardiac Doppler Indications:     R07.9 Chest Pain  History:         Patient has no prior history of Echocardiogram examinations.                  Signs/Symptoms:Shortness of Breath and Chest Pain; Risk                  Factors:Hypertension.  Sonographer:     Charmayne Sheer RDCS (AE) Referring Phys:  [place] Diagnosing Phys: Serafina Royals  MD  Sonographer Comments: Suboptimal parasternal window. IMPRESSIONS  1. Left ventricular ejection fraction, by estimation, is 35 to 40%. The left ventricle has moderately decreased function. The left ventricle demonstrates regional wall motion abnormalities (see scoring diagram/findings for description). Left ventricular  diastolic parameters were normal.  2. Right ventricular systolic function is normal. The right ventricular size is normal.  3. Left atrial size was mildly dilated.  4. The mitral valve is normal in structure. Mild mitral valve regurgitation.  5. The aortic valve is normal in structure.  Aortic valve regurgitation is trivial. Moderate aortic valve stenosis. FINDINGS  Left Ventricle: Left ventricular ejection fraction, by estimation, is 35 to 40%. The left ventricle has moderately decreased function. The left ventricle demonstrates regional wall motion abnormalities. Moderate hypokinesis of the left ventricular, mid-apical inferoseptal wall and anteroseptal wall. The left ventricular internal cavity size was normal in size. There is borderline left ventricular hypertrophy. Left ventricular diastolic parameters were normal. Right Ventricle: The right ventricular size is normal. No increase in right ventricular wall thickness. Right ventricular systolic function is normal. Left Atrium: Left atrial size was mildly dilated. Right Atrium: Right atrial size was normal in size. Pericardium: There is no evidence of pericardial effusion. Mitral Valve: The mitral valve is normal in structure. Mild mitral valve regurgitation. MV peak gradient, 5.1 mmHg. The mean mitral valve gradient is 2.0 mmHg. Tricuspid Valve: The tricuspid valve is normal in structure. Tricuspid valve regurgitation is mild. Aortic Valve: The aortic valve is normal in structure. Aortic valve regurgitation is trivial. Moderate aortic stenosis is present. Aortic valve mean gradient measures 19.0 mmHg. Aortic valve peak gradient measures 33.3 mmHg. Aortic valve area, by VTI measures 0.68 cm. Pulmonic Valve: The pulmonic valve was grossly normal. Pulmonic valve regurgitation is not visualized. Aorta: The aortic root and ascending aorta are structurally normal, with no evidence of dilitation. IAS/Shunts: No atrial level shunt detected by color flow Doppler.  LEFT VENTRICLE PLAX 2D LVIDd:         3.87 cm      Diastology LVIDs:         3.17 cm      LV e' lateral:   7.18 cm/s LV PW:         0.93 cm      LV E/e' lateral: 14.6 LV IVS:        0.87 cm      LV e' medial:    5.77 cm/s LVOT diam:     2.20 cm      LV E/e' medial:  18.2 LV SV:         36 LV  SV Index:   18 LVOT Area:     3.80 cm  LV Volumes (MOD) LV vol d, MOD A2C: 116.0 ml LV vol d, MOD A4C: 108.0 ml LV vol s, MOD A2C: 88.3 ml LV vol s, MOD A4C: 79.7 ml LV SV MOD A2C:     27.7 ml LV SV MOD A4C:     108.0 ml LV SV MOD BP:      32.2 ml LEFT ATRIUM           Index       RIGHT ATRIUM           Index LA diam:      4.20 cm 2.16 cm/m  RA Area:     14.60 cm LA Vol (A4C): 48.0 ml 24.64 ml/m RA Volume:   38.10 ml  19.56 ml/m  AORTIC VALVE  PULMONIC VALVE AV Area (Vmax):    0.72 cm     PV Vmax:       0.67 m/s AV Area (Vmean):   0.70 cm     PV Vmean:      45.800 cm/s AV Area (VTI):     0.68 cm     PV VTI:        0.115 m AV Vmax:           288.67 cm/s  PV Peak grad:  1.8 mmHg AV Vmean:          198.667 cm/s PV Mean grad:  1.0 mmHg AV VTI:            0.524 m AV Peak Grad:      33.3 mmHg AV Mean Grad:      19.0 mmHg LVOT Vmax:         54.70 cm/s LVOT Vmean:        36.700 cm/s LVOT VTI:          0.094 m LVOT/AV VTI ratio: 0.18  AORTA Ao Root diam: 3.40 cm MITRAL VALVE MV Area (PHT): 5.05 cm     SHUNTS MV Peak grad:  5.1 mmHg     Systemic VTI:  0.09 m MV Mean grad:  2.0 mmHg     Systemic Diam: 2.20 cm MV Vmax:       1.13 m/s MV Vmean:      63.5 cm/s MV Decel Time: 150 msec MV E velocity: 105.17 cm/s MV A velocity: 56.90 cm/s MV E/A ratio:  1.85 Serafina Royals MD Electronically signed by Serafina Royals MD Signature Date/Time: 03/06/2020/7:09:42 PM    Final      Assessment and Recommendation  79 y.o. male 79 year old male with hypertension hyperlipidemia peripheral vascular disease with acute non-ST elevation myocardial infarction and moderate aortic valve stenosis with critical three-vessel coronary artery disease likely contributing to above with slow improvements at this time and now off oxygen and feeling much better 1.  Okay for discontinuation of heparin with continuation aspirin and addition of beta-blocker today for further risk reduction of myocardial infarction 2.  Continuation  of Lasix for pulmonary edema congestive heart failure and would consider the possibility of changing over to oral Lasix for tomorrow 3.  Further reassessment of Doppler of echocardiogram to assess extent of aortic valve stenosis as necessary 4.  Maximize medical management and recovery from non-ST elevation myocardial infarction with congestive heart failure and further consideration of coronary artery bypass surgery with or without aortic valve intervention depending on further evaluation of above 5.  Ascending aorta appears to be 4.6 cm which appears to be noncritical without evidence of dissection 6.  Nitrates for diffuse three-vessel coronary disease which appears to be working relatively well 7.  Continue ambulation and further treatment as above Signed, Serafina Royals M.D. FACC

## 2020-03-10 DIAGNOSIS — J9601 Acute respiratory failure with hypoxia: Secondary | ICD-10-CM | POA: Diagnosis not present

## 2020-03-10 DIAGNOSIS — I214 Non-ST elevation (NSTEMI) myocardial infarction: Secondary | ICD-10-CM | POA: Diagnosis not present

## 2020-03-10 DIAGNOSIS — I10 Essential (primary) hypertension: Secondary | ICD-10-CM | POA: Diagnosis not present

## 2020-03-10 DIAGNOSIS — I5021 Acute systolic (congestive) heart failure: Secondary | ICD-10-CM | POA: Diagnosis not present

## 2020-03-10 MED ORDER — FUROSEMIDE 40 MG PO TABS: 40.0000 mg | ORAL_TABLET | Freq: Every day | ORAL | 0 refills | Status: DC

## 2020-03-10 MED ORDER — ISOSORBIDE MONONITRATE ER 30 MG PO TB24: 30.0000 mg | ORAL_TABLET | Freq: Every day | ORAL | 0 refills | Status: DC

## 2020-03-10 MED ORDER — LISINOPRIL 5 MG PO TABS
5.0000 mg | ORAL_TABLET | Freq: Every day | ORAL | Status: DC
  Administered 2020-03-10: 5 mg via ORAL
  Filled 2020-03-10: qty 1

## 2020-03-10 MED ORDER — LISINOPRIL 5 MG PO TABS: 5.0000 mg | ORAL_TABLET | Freq: Every day | ORAL | 0 refills | Status: DC

## 2020-03-10 MED ORDER — METOPROLOL TARTRATE 50 MG PO TABS: 50.0000 mg | ORAL_TABLET | Freq: Two times a day (BID) | ORAL | 0 refills | Status: AC

## 2020-03-10 MED ORDER — SPIRONOLACTONE 25 MG PO TABS: 12.5000 mg | ORAL_TABLET | Freq: Every day | ORAL | 0 refills | Status: DC

## 2020-03-10 NOTE — Discharge Summary (Signed)
[facility] NAME: Andre Murphy    MR#:  [id number]  DATE OF BIRTH:  [birth year]  DATE OF ADMISSION:  03/05/2020 ADMITTING PHYSICIAN: Ivor Costa, MD  DATE OF DISCHARGE: 03/10/2020 12:13 PM  PRIMARY CARE PHYSICIAN: [place] Pink, MD    ADMISSION DIAGNOSIS:  Acute pulmonary edema ([place]) [J81.0] NSTEMI (non-ST elevated myocardial infarction) ([place]) [I21.4] Acute on chronic respiratory failure with hypoxia (HCC) [J96.21] Acute respiratory failure with hypoxemia ([place]) [J96.01] New onset of congestive heart failure ([place]) [I50.9]  DISCHARGE DIAGNOSIS:  Principal Problem:   Acute respiratory failure with hypoxia (HCC) Active Problems:   Essential hypertension   NSTEMI (non-ST elevated myocardial infarction) (HCC)   Leukocytosis   HLD (hyperlipidemia)   Acute on chronic respiratory failure with hypoxia (HCC)   Acute systolic CHF (congestive heart failure) (HCC)   Thoracic aortic aneurysm without rupture ([place])   SECONDARY DIAGNOSIS:   Past Medical History:  Diagnosis Date  . Bursitis   . Elevated lipids   . Hypertension   . SOB (shortness of breath)     HOSPITAL COURSE:   1.  NSTEMI.  Cardiac catheter shows multivessel disease.  Cardiology recommended going home and following up with cardiothoracic surgery as outpatient.  Patient has an allergy to Crestor.  Patient on aspirin, metoprolol and Imdur.  Troponin peaked at 1630.  LDL 86. 2.  Acute hypoxic respiratory failure.  This has resolved.  Patient is breathing currently fine off oxygen. 3.  Acute systolic congestive heart failure.  Echocardiogram showed an ejection fraction of 35 to 40% with moderate aortic stenosis.  Patient was on IV Lasix here during the hospital course and be switched over to oral Lasix 40 mg daily upon discharge.  Patient on metoprolol, lisinopril.  I will also add low-dose Aldactone. 4.  Essential hypertension on metoprolol, lisinopril, Lasix and adding  Aldactone. 5.  Thoracic aneurysm seen on CT scan of the chest.  This will also need follow-up as outpatient. 6.  Since the patient is on Imdur must stop Cialis.  DISCHARGE CONDITIONS:   Satisfactory  CONSULTS OBTAINED:  Treatment Team:  Ottie Glazier, MD  DRUG ALLERGIES:   Allergies  Allergen Reactions  . Crestor [Rosuvastatin Calcium] Other (See Comments)    Muscle pain  . Rosuvastatin     Other reaction(s): Muscle Pain    DISCHARGE MEDICATIONS:   Allergies as of 03/10/2020      Reactions   Crestor [rosuvastatin Calcium] Other (See Comments)   Muscle pain   Rosuvastatin    Other reaction(s): Muscle Pain      Medication List    STOP taking these medications   amLODipine 5 MG tablet Commonly known as: NORVASC   Cialis 20 MG tablet Generic drug: tadalafil     TAKE these medications   aspirin EC 81 MG tablet Take 81 mg by mouth daily.   furosemide 40 MG tablet Commonly known as: Lasix Take 1 tablet (40 mg total) by mouth daily.   isosorbide mononitrate 30 MG 24 hr tablet Commonly known as: IMDUR Take 1 tablet (30 mg total) by mouth daily.   lisinopril 5 MG tablet Commonly known as: ZESTRIL Take 1 tablet (5 mg total) by mouth daily.   metoprolol tartrate 50 MG tablet Commonly known as: LOPRESSOR Take 1 tablet (50 mg total) by mouth 2 (two) times daily.   spironolactone 25 MG tablet Commonly known as: Aldactone Take 0.5 tablets (12.5 mg total) by mouth daily.   temazepam  15 MG capsule Commonly known as: RESTORIL Take 15 mg by mouth at bedtime as needed.   vitamin B-12 250 MCG tablet Commonly known as: CYANOCOBALAMIN Take 250 mcg by mouth as needed.        DISCHARGE INSTRUCTIONS:   Cardiology set up an appointment with cardiothoracic surgery for Monday Follow-up PMD 5 days Follow-up cardiology 1 week  If you experience worsening of your admission symptoms, develop shortness of breath, life threatening emergency, suicidal or homicidal  thoughts you must seek medical attention immediately by calling 911 or calling your MD immediately  if symptoms less severe.  You Must read complete instructions/literature along with all the possible adverse reactions/side effects for all the Medicines you take and that have been prescribed to you. Take any new Medicines after you have completely understood and accept all the possible adverse reactions/side effects.   Please note  You were cared for by a hospitalist during your hospital stay. If you have any questions about your discharge medications or the care you received while you were in the hospital after you are discharged, you can call the unit and asked to speak with the hospitalist on call if the hospitalist that took care of you is not available. Once you are discharged, your primary care physician will handle any further medical issues. Please note that NO REFILLS for any discharge medications will be authorized once you are discharged, as it is imperative that you return to your primary care physician (or establish a relationship with a primary care physician if you do not have one) for your aftercare needs so that they can reassess your need for medications and monitor your lab values.    Today   CHIEF COMPLAINT:   Chief Complaint  Patient presents with  . Shortness of Breath    HISTORY OF PRESENT ILLNESS:  Andre Murphy  is a 79 y.o. male came in with shortness of breath   VITAL SIGNS:  Blood pressure 126/73, pulse 87, temperature 97.9 F (36.6 C), resp. rate 19, height 5\' 10"  (1.778 m), weight 79 kg, SpO2 96 %.  I/O:    Intake/Output Summary (Last 24 hours) at 03/10/2020 1651 Last data filed at 03/10/2020 1057 Gross per 24 hour  Intake 360 ml  Output 1250 ml  Net -890 ml    PHYSICAL EXAMINATION:  GENERAL:  79 y.o.-year-old patient lying in the bed with no acute distress.  EYES: Pupils equal, round, reactive to light and accommodation. No scleral icterus.  Extraocular muscles intact.  HEENT: Head atraumatic, normocephalic. Oropharynx and nasopharynx clear.   LUNGS: Normal breath sounds bilaterally, no wheezing, rales,rhonchi or crepitation. No use of accessory muscles of respiration.  CARDIOVASCULAR: S1, S2 normal.  2 out of 6 systolic murmurs.  ABDOMEN: Soft, non-tender, non-distended. Bowel sounds present. No organomegaly or mass.  EXTREMITIES: No pedal edema, cyanosis, or clubbing.  NEUROLOGIC: Cranial nerves II through XII are intact. Muscle strength 5/5 in all extremities. Sensation intact. Gait not checked.  PSYCHIATRIC: The patient is alert and oriented x 3.  SKIN: No obvious rash, lesion, or ulcer.   DATA REVIEW:   CBC Recent Labs  Lab 03/09/20 0455  WBC 7.8  HGB 12.6*  HCT 37.1*  PLT 229    Chemistries  Recent Labs  Lab 03/08/20 0009  NA 141  K 3.5  CL 104  CO2 26  GLUCOSE 91  BUN 26*  CREATININE 1.06  CALCIUM 8.1*  MG 1.8    Microbiology Results  Results for orders  placed or performed during the hospital encounter of 03/05/20  Respiratory Panel by RT PCR (Flu A&B, Covid) - Nasopharyngeal Swab     Status: None   Collection Time: 03/05/20  5:49 AM   Specimen: Nasopharyngeal Swab  Result Value Ref Range Status   SARS Coronavirus 2 by RT PCR NEGATIVE NEGATIVE Final    Comment: (NOTE) SARS-CoV-2 target nucleic acids are NOT DETECTED. The SARS-CoV-2 RNA is generally detectable in upper respiratoy specimens during the acute phase of infection. The lowest concentration of SARS-CoV-2 viral copies this assay can detect is 131 copies/mL. A negative result does not preclude SARS-Cov-2 infection and should not be used as the sole basis for treatment or other patient management decisions. A negative result may occur with  improper specimen collection/handling, submission of specimen other than nasopharyngeal swab, presence of viral mutation(s) within the areas targeted by this assay, and inadequate number of viral  copies (<131 copies/mL). A negative result must be combined with clinical observations, patient history, and epidemiological information. The expected result is Negative. Fact Sheet for Patients:  PinkCheek.be Fact Sheet for Healthcare Providers:  GravelBags.it This test is not yet ap proved or cleared by the [place] FDA and  has been authorized for detection and/or diagnosis of SARS-CoV-2 by FDA under an Emergency Use Authorization (EUA). This EUA will remain  in effect (meaning this test can be used) for the duration of the COVID-19 declaration under Section 564(b)(1) of the Act, 21 U.S.C. section 360bbb-3(b)(1), unless the authorization is terminated or revoked sooner.    Influenza A by PCR NEGATIVE NEGATIVE Final   Influenza B by PCR NEGATIVE NEGATIVE Final    Comment: (NOTE) The Xpert Xpress SARS-CoV-2/FLU/RSV assay is intended as an aid in  the diagnosis of influenza from Nasopharyngeal swab specimens and  should not be used as a sole basis for treatment. Nasal washings and  aspirates are unacceptable for Xpert Xpress SARS-CoV-2/FLU/RSV  testing. Fact Sheet for Patients: PinkCheek.be Fact Sheet for Healthcare Providers: GravelBags.it This test is not yet approved or cleared by the [place] FDA and  has been authorized for detection and/or diagnosis of SARS-CoV-2 by  FDA under an Emergency Use Authorization (EUA). This EUA will remain  in effect (meaning this test can be used) for the duration of the  Covid-19 declaration under Section 564(b)(1) of the Act, 21  U.S.C. section 360bbb-3(b)(1), unless the authorization is  terminated or revoked. Performed at [facility], [address], [place], [place] [address]       Management plans discussed with the patient, family and they are in agreement.  CODE STATUS:     Code Status Orders   (From admission, onward)         Start     Ordered   03/07/20 1541  Full code  Continuous     03/07/20 1540        Code Status History    Date Active Date Inactive Code Status Order ID Comments User Context   03/05/2020 1559 03/07/2020 1541 Full Code [id number]  Ivor Costa, MD ED   Advance Care Planning Activity      TOTAL TIME TAKING CARE OF THIS PATIENT: 35 minutes.    Loletha Grayer M.D on 03/10/2020 at 4:51 PM  Between 7am to 6pm - Pager - [phone]  After 6pm go to [email or web address] - password [email or web address]  [facility]  CC: Primary care physician; [place] Pink, MD

## 2020-03-10 NOTE — Progress Notes (Signed)
[facility] Cardiology [facility] Encounter Note  Patient: Andre DIELEMAN Sr. / Admit Date: 03/05/2020 / Date of Encounter: 03/10/2020, 8:42 AM   Subjective: Patient feeling much better today.  No evidence of chest pain.  Patient rested well overnight.  Troponin elevation now up to 1630 consistent with non-ST elevation myocardial infarction.  Telemetry shows normal sinus rhythm.  Patient is remaining on appropriate medication management for non-ST elevation myocardial infarction  Echocardiogram showing moderate LV systolic dysfunction with septal distal anterior and apical hypokinesis with ejection fraction of 35% with Moderate aortic valve stenosis with 3.23 m/s velocity with a mean of 24 mm of a peak of 54 mm Cardiac catheterization showing significant diffuse three-vessel coronary artery disease with 90% mid LAD 90% and diffuse mid circumflex and 100% proximal right coronary artery stenosis Could not fully cross aortic valve for further pressures or assessment of aortic valve stenosis at this time Chest x-ray shows resolving pulmonary edema consistent with congestive heart failure CT  With acending aorta 4.6 cm with post stenotic dilation from aortic valve stenosis which means possibly not needing vascualr surgery but treatment with cabg and possible aortic valve replacement Review of Systems: Positive for: None Negative for: Vision change, hearing change, syncope, dizziness, nausea, vomiting,diarrhea, bloody stool, stomach pain, cough, congestion, diaphoresis, urinary frequency, urinary pain,skin lesions, skin rashes Others previously listed  Objective: Telemetry: Normal sinus rhythm Physical Exam: Blood pressure 126/73, pulse 87, temperature 97.9 F (36.6 C), resp. rate 19, height 5\' 10"  (1.778 m), weight 79 kg, SpO2 96 %. Body mass index is 25 kg/m. General: Well developed, well nourished, in no acute distress. Head: Normocephalic, atraumatic, sclera non-icteric, no xanthomas, nares are  without discharge. Neck: No apparent masses Lungs: Normal respirations with no wheezes, no rhonchi, no rales , few basilar crackles   Heart: Regular rate and rhythm, normal S1 S2, 2-3+ aortic murmur, no rub, no gallop, PMI is normal size and placement, carotid upstroke normal without bruit, jugular venous pressure normal Abdomen: Soft, non-tender, non-distended with normoactive bowel sounds. No hepatosplenomegaly. Abdominal aorta is normal size without bruit Extremities: Trace edema, no clubbing, no cyanosis, no ulcers,  Peripheral: 2+ radial, 2+ femoral, 2+ dorsal pedal pulses Neuro: Alert and oriented. Moves all extremities spontaneously. Psych:  Responds to questions appropriately with a normal affect.   Intake/Output Summary (Last 24 hours) at 03/10/2020 0842 Last data filed at 03/10/2020 0527 Gross per 24 hour  Intake --  Output 2400 ml  Net -2400 ml    Inpatient Medications:  . aspirin EC  81 mg Oral Daily  . dextromethorphan-guaiFENesin  1 tablet Oral BID  . enoxaparin (LOVENOX) injection  40 mg Subcutaneous Q24H  . furosemide  40 mg Intravenous BID  . isosorbide mononitrate  30 mg Oral Daily  . lisinopril  5 mg Oral Daily  . metoprolol tartrate  50 mg Oral BID  . sodium chloride flush  3 mL Intravenous Q12H   Infusions:    Labs: Recent Labs    03/08/20 0009  NA 141  K 3.5  CL 104  CO2 26  GLUCOSE 91  BUN 26*  CREATININE 1.06  CALCIUM 8.1*  MG 1.8   No results for input(s): AST, ALT, ALKPHOS, BILITOT, PROT, ALBUMIN in the last 72 hours. Recent Labs    03/08/20 0009 03/09/20 0455  WBC 9.5 7.8  HGB 12.5* 12.6*  HCT 37.2* 37.1*  MCV 88.4 87.9  PLT 240 229   No results for input(s): CKTOTAL, CKMB, TROPONINI in the  last 72 hours. Invalid input(s): POCBNP No results for input(s): HGBA1C in the last 72 hours.   Weights: Filed Weights   03/08/20 0544 03/09/20 0604 03/10/20 0526  Weight: 81.9 kg 79.8 kg 79 kg     Radiology/Studies:  CT Angio Chest PE  W/Cm &/Or Wo Cm  Result Date: 03/05/2020 CLINICAL DATA:  Shortness of breath EXAM: CT ANGIOGRAPHY CHEST WITH CONTRAST TECHNIQUE: Multidetector CT imaging of the chest was performed using the standard protocol during bolus administration of intravenous contrast. Multiplanar CT image reconstructions and MIPs were obtained to evaluate the vascular anatomy. CONTRAST:  83mL OMNIPAQUE IOHEXOL 350 MG/ML SOLN COMPARISON:  None. FINDINGS: Cardiovascular: Satisfactory opacification of the pulmonary arteries to the proximal segmental level. No evidence of pulmonary embolism. Normal heart size. Reflux of contrast into the hepatic veins, which could reflect right heart dysfunction. Coronary artery and aortic calcifications. No pericardial effusion. Mediastinum/Nodes: Mildly prominent mediastinal and hilar nodes, likely reactive. Thyroid appears partially absent and not well evaluated due to artifact. Lungs/Pleura: Small bilateral pleural effusions and mild bibasilar atelectasis. Diffuse interlobular septal thickening. Mild peribronchial thickening. No pneumothorax. Upper Abdomen: Probable small hiatal hernia.  Small hepatic cysts. Musculoskeletal: No chest wall abnormality. Mild thoracic spine degenerative changes. No acute osseous abnormality. Review of the MIP images confirms the above findings. IMPRESSION: Suboptimal evaluation due to motion artifact. No evidence of acute pulmonary embolism. Interstitial pulmonary edema.  Small bilateral pleural effusions. Electronically Signed   By: Macy Mis M.D.   On: 03/05/2020 07:42   CARDIAC CATHETERIZATION  Result Date: 03/07/2020  Ost RCA to Prox RCA lesion is 100% stenosed.  Dist RCA lesion is 100% stenosed.  Prox Cx to Mid Cx lesion is 99% stenosed.  Mid Cx lesion is 85% stenosed.  Prox Cx lesion is 85% stenosed.  1st Mrg lesion is 90% stenosed.  Prox LAD lesion is 50% stenosed.  1st Diag lesion is 90% stenosed.  Mid LAD-1 lesion is 85% stenosed.  Mid LAD-2  lesion is 85% stenosed.  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 with multiple coronary atherosclerosis of multiple areas Unable to cross valve due to significant stenoses and dilation of aortic root Assessment Acute non-ST elevation myocardial infarction Moderate to severe aortic valve stenosis 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 Hypertension. EXAM: PORTABLE CHEST 1 VIEW COMPARISON:  CT 1 day prior FINDINGS: Midline trachea. Mild cardiomegaly. The previously described bilateral pleural effusions are not readily apparent. Mild pulmonary venous congestion, with  improved interstitial edema. No lobar consolidation. Numerous leads and wires project over the chest. IMPRESSION:  Cardiomegaly with mild pulmonary venous congestion, improved. Electronically Signed   By: Abigail Miyamoto M.D.   On: 03/07/2020 08:21   CT ANGIO CHEST AORTA W/CM &/OR WO/CM  Result Date: 03/08/2020 CLINICAL DATA:  Follow-up thoracic aortic aneurysm. EXAM: CT ANGIOGRAPHY CHEST WITH CONTRAST TECHNIQUE: Multidetector CT imaging of the chest was performed using the standard protocol during bolus administration of intravenous contrast. Multiplanar CT image reconstructions and MIPs were obtained to evaluate the vascular anatomy. 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 No dissection. The branch vessels are patent. Moderate atherosclerotic calcifications at the origins. Moderate atherosclerotic disease involving the descending thoracic aorta with areas of calcified and noncalcified plaque. Scattered coronary artery calcifications are noted. Moderate calcifications are noted at the aortic valve suggesting aortic stenosis and poststenotic dilatation of the ascending aorta. Mediastinum/Nodes: No mediastinal or hilar mass or adenopathy. Small scattered sub 8 mm lymph nodes are noted. The esophagus is grossly normal. Lungs/Pleura: There are small bilateral pleural effusions with overlying atelectasis. No infiltrates or pulmonary edema. No worrisome pulmonary lesions or pulmonary nodules. The central tracheobronchial tree is unremarkable. Upper Abdomen: No significant upper abdominal findings. Scattered hepatic cysts are noted. Layering high attenuation material in the gallbladder could be sludge, stones or contrast material with vicarious excretion. Aortic and branch vessel calcifications are noted. Musculoskeletal: No chest wall mass, supraclavicular or axillary adenopathy. 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 The right thyroid lobe is unremarkable. No  significant bony findings. Review of the MIP images confirms the above findings. IMPRESSION: 1. 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 No dissection. 2. Moderate calcifications at the aortic valve suggesting aortic stenosis and poststenotic dilatation of the ascending aorta. 3. Small bilateral pleural effusions with overlying atelectasis. 4. No infiltrates or pulmonary edema. 5. Aortic atherosclerosis. Aortic Atherosclerosis (ICD10-I70.0). Aortic Atherosclerosis (ICD10-I70.0). Electronically Signed   By: Marijo Sanes M.D.   On: 03/08/2020 14:53   ECHOCARDIOGRAM COMPLETE  Result Date: 03/06/2020    ECHOCARDIOGRAM REPORT   Patient Name:   Andre Murphy Date of Exam: 03/06/2020 Medical Rec #:  [id number]      Height:       70.0 in Accession #:    [id number]     Weight:       170.0 lb Date of Birth:  [birth year]      BSA:          1.948 m Patient Age:    86 years       BP:           122/77 mmHg Patient Gender: M              HR:           87 bpm. Exam Location:  [facility] Procedure: 2D Echo, Color Doppler and Cardiac Doppler Indications:     R07.9 Chest Pain  History:         Patient has no prior history of Echocardiogram examinations.                  Signs/Symptoms:Shortness of Breath and Chest Pain; Risk                  Factors:Hypertension.  Sonographer:     Charmayne Sheer RDCS (  AE) Referring Phys:  [id number] Soledad Gerlach NIU Diagnosing Phys: Serafina Royals MD  Sonographer Comments: Suboptimal parasternal window. IMPRESSIONS  1. Left ventricular ejection fraction, by estimation, is 35 to 40%. The left ventricle has moderately decreased function. The left ventricle demonstrates regional wall motion abnormalities (see scoring diagram/findings for description). Left ventricular  diastolic parameters were normal.  2. Right ventricular systolic function is normal. The right ventricular size is normal.  3. Left atrial size was mildly dilated.  4. The mitral valve is normal in  structure. Mild mitral valve regurgitation.  5. The aortic valve is normal in structure. Aortic valve regurgitation is trivial. Moderate aortic valve stenosis. FINDINGS  Left Ventricle: Left ventricular ejection fraction, by estimation, is 35 to 40%. The left ventricle has moderately decreased function. The left ventricle demonstrates regional wall motion abnormalities. Moderate hypokinesis of the left ventricular, mid-apical inferoseptal wall and anteroseptal wall. The left ventricular internal cavity size was normal in size. There is borderline left ventricular hypertrophy. Left ventricular diastolic parameters were normal. Right Ventricle: The right ventricular size is normal. No increase in right ventricular wall thickness. Right ventricular systolic function is normal. Left Atrium: Left atrial size was mildly dilated. Right Atrium: Right atrial size was normal in size. Pericardium: There is no evidence of pericardial effusion. Mitral Valve: The mitral valve is normal in structure. Mild mitral valve regurgitation. MV peak gradient, 5.1 mmHg. The mean mitral valve gradient is 2.0 mmHg. Tricuspid Valve: The tricuspid valve is normal in structure. Tricuspid valve regurgitation is mild. Aortic Valve: The aortic valve is normal in structure. Aortic valve regurgitation is trivial. Moderate aortic stenosis is present. Aortic valve mean gradient measures 19.0 mmHg. Aortic valve peak gradient measures 33.3 mmHg. Aortic valve area, by VTI measures 0.68 cm. Pulmonic Valve: The pulmonic valve was grossly normal. Pulmonic valve regurgitation is not visualized. Aorta: The aortic root and ascending aorta are structurally normal, with no evidence of dilitation. IAS/Shunts: No atrial level shunt detected by color flow Doppler.  LEFT VENTRICLE PLAX 2D LVIDd:         3.87 cm      Diastology LVIDs:         3.17 cm      LV e' lateral:   7.18 cm/s LV PW:         0.93 cm      LV E/e' lateral: 14.6 LV IVS:        0.87 cm      LV e'  medial:    5.77 cm/s LVOT diam:     2.20 cm      LV E/e' medial:  18.2 LV SV:         36 LV SV Index:   18 LVOT Area:     3.80 cm  LV Volumes (MOD) LV vol d, MOD A2C: 116.0 ml LV vol d, MOD A4C: 108.0 ml LV vol s, MOD A2C: 88.3 ml LV vol s, MOD A4C: 79.7 ml LV SV MOD A2C:     27.7 ml LV SV MOD A4C:     108.0 ml LV SV MOD BP:      32.2 ml LEFT ATRIUM           Index       RIGHT ATRIUM           Index LA diam:      4.20 cm 2.16 cm/m  RA Area:     14.60 cm LA Vol (A4C): 48.0 ml 24.64 ml/m RA Volume:  38.10 ml  19.56 ml/m  AORTIC VALVE                    PULMONIC VALVE AV Area (Vmax):    0.72 cm     PV Vmax:       0.67 m/s AV Area (Vmean):   0.70 cm     PV Vmean:      45.800 cm/s AV Area (VTI):     0.68 cm     PV VTI:        0.115 m AV Vmax:           288.67 cm/s  PV Peak grad:  1.8 mmHg AV Vmean:          198.667 cm/s PV Mean grad:  1.0 mmHg AV VTI:            0.524 m AV Peak Grad:      33.3 mmHg AV Mean Grad:      19.0 mmHg LVOT Vmax:         54.70 cm/s LVOT Vmean:        36.700 cm/s LVOT VTI:          0.094 m LVOT/AV VTI ratio: 0.18  AORTA Ao Root diam: 3.40 cm MITRAL VALVE MV Area (PHT): 5.05 cm     SHUNTS MV Peak grad:  5.1 mmHg     Systemic VTI:  0.09 m MV Mean grad:  2.0 mmHg     Systemic Diam: 2.20 cm MV Vmax:       1.13 m/s MV Vmean:      63.5 cm/s MV Decel Time: 150 msec MV E velocity: 105.17 cm/s MV A velocity: 56.90 cm/s MV E/A ratio:  1.85 Serafina Royals MD Electronically signed by Serafina Royals MD Signature Date/Time: 03/06/2020/7:09:42 PM    Final      Assessment and Recommendation  79 y.o. male 79 year old male with hypertension hyperlipidemia peripheral vascular disease with acute non-ST elevation myocardial infarction and moderate aortic valve stenosis with critical three-vessel coronary artery disease likely contributing to above with difficult improvements with medical management now with no symptoms at all at rest and with mild ambulation throughout the hallways 1.  Single  antiplatelet therapy for coronary artery disease due to possible surgery in near future to avoid Plavix due to possible bleeding complication 2.  Continuation Lasix orally at 40 mg 3.  Outpatient reassessment of LV systolic dysfunction and significance of aortic valve stenosis for possible surgical intervention 4.  Continue to maximize medication management for anginal symptoms with isosorbide 5.  Ascending aorta appears to be 4.6 cm which appears to be noncritical without evidence of dissection no need for aortic repair 6.  Beta-blocker and ACE inhibitor for cardiomyopathy LV systolic dysfunction 7.  Continue ambulation and okay for discharge home from cardiac standpoint due to increased stability with follow-up on Monday with cardiovascular surgery at [place], Serafina Royals M.D. FACC

## 2020-03-10 NOTE — Discharge Instructions (Signed)
Heart Failure, Diagnosis  Heart failure means that your heart is not able to pump blood in the right way. This makes it hard for your body to work well. Heart failure is usually a long-term (chronic) condition. You must take good care of yourself and follow your treatment plan from your doctor. What are the causes? This condition may be caused by:  High blood pressure.  Build up of cholesterol and fat in the arteries.  Heart attack. This injures the heart muscle.  Heart valves that do not open and close properly.  Damage of the heart muscle. This is also called cardiomyopathy.  Lung disease.  Abnormal heart rhythms. What increases the risk? The risk of heart failure goes up as a person ages. This condition is also more likely to develop in people who:  Are overweight.  Are male.  Smoke or chew tobacco.  Abuse alcohol or illegal drugs.  Have taken medicines that can damage the heart.  Have diabetes.  Have abnormal heart rhythms.  Have thyroid problems.  Have low blood counts (anemia). What are the signs or symptoms? Symptoms of this condition include:  Shortness of breath.  Coughing.  Swelling of the feet, ankles, legs, or belly.  Losing weight for no reason.  Trouble breathing.  Waking from sleep because of the need to sit up and get more air.  Rapid heartbeat.  Being very tired.  Feeling dizzy, or feeling like you may pass out (faint).  Having no desire to eat.  Feeling like you may vomit (nauseous).  Peeing (urinating) more at night.  Feeling confused. How is this treated?     This condition may be treated with:  Medicines. These can be given to treat blood pressure and to make the heart muscles stronger.  Changes in your daily life. These may include eating a healthy diet, staying at a healthy body weight, quitting tobacco and illegal drug use, or doing exercises.  Surgery. Surgery can be done to open blocked valves, or to put  devices in the heart, such as pacemakers.  A donor heart (heart transplant). You will receive a healthy heart from a donor. Follow these instructions at home:  Treat other conditions as told by your doctor. These may include high blood pressure, diabetes, thyroid disease, or abnormal heart rhythms.  Learn as much as you can about heart failure.  Get support as you need it.  Keep all follow-up visits as told by your doctor. This is important. Summary  Heart failure means that your heart is not able to pump blood in the right way.  This condition is caused by high blood pressure, heart attack, or damage of the heart muscle.  Symptoms of this condition include shortness of breath and swelling of the feet, ankles, legs, or belly. You may also feel very tired or feel like you may vomit.  You may be treated with medicines, surgery, or changes in your daily life.  Treat other health conditions as told by your doctor. This information is not intended to replace advice given to you by your health care provider. Make sure you discuss any questions you have with your health care provider. Document Revised: 01/30/2019 Document Reviewed: 01/30/2019 Elsevier Patient Education  [place].   Heart Attack A heart attack occurs when blood and oxygen supply to the heart is cut off. A heart attack causes damage to the heart that cannot be fixed. A heart attack is also called a myocardial infarction, or MI. If  you think you are having a heart attack, do not wait to see if the symptoms will go away. Get medical help right away. What are the causes? This condition may be caused by:  A fatty substance (plaque) in the blood vessels (arteries). This can block the flow of blood to the heart.  A blood clot in the blood vessels that go to the heart. The blood clot blocks blood flow.  Low blood pressure.  An abnormal heartbeat.  Some diseases, such as problems in red blood cells  (anemia)orproblems in breathing (respiratory failure).  Tightening (spasm) of a blood vessel that cuts off blood to the heart.  A tear in a blood vessel of the heart.  High blood pressure. What increases the risk? The following factors may make you more likely to develop this condition:  Aging. The older you are, the higher your risk.  Having a personal or family history of chest pain, heart attack, stroke, or narrowing of the arteries in the legs, arms, head, or stomach (peripheral artery disease).  Being male.  Smoking.  Not getting regular exercise.  Being overweight or obese.  Having high blood pressure.  Having high cholesterol.  Having diabetes.  Drinking too much alcohol.  Using illegal drugs, such as cocaine or methamphetamine. What are the signs or symptoms? Symptoms of this condition include:  Chest pain. It may feel like: ? Crushing or squeezing. ? Tightness, pressure, fullness, or heaviness.  Pain in the arm, neck, jaw, back, or upper body.  Shortness of breath.  Heartburn.  Upset stomach (indigestion).  Feeling like you may vomit (nauseous).  Cold sweats.  Feeling tired.  Sudden light-headedness. How is this treated? A heart attack must be treated as soon as possible. Treatment may include:  Medicines to: ? Break up or dissolve blood clots. ? Thin blood and help prevent blood clots. ? Treat blood pressure. ? Improve blood flow to the heart. ? Reduce pain. ? Reduce cholesterol.  Procedures to widen a blocked artery and keep it open.  Open heart surgery.  Receiving oxygen.  Making your heart strong again (cardiac rehabilitation) through exercise, education, and counseling. Follow these instructions at home: Medicines  Take over-the-counter and prescription medicines only as told by your doctor. You may need to take medicine: ? To keep your blood from clotting too easily. ? To control blood pressure. ? To lower cholesterol. ? To  control heart rhythms.  Do not take these medicines unless your doctor says it is okay: ? NSAIDs, such as ibuprofen. ? Supplements that have vitamin A, vitamin E, or both. ? Hormone replacement therapy that has estrogen with or without progestin. Lifestyle      Do not use any products that have nicotine or tobacco, such as cigarettes, e-cigarettes, and chewing tobacco. If you need help quitting, ask your doctor.  Avoid secondhand smoke.  Exercise regularly. Ask your doctor about a cardiac rehab program.  Eat heart-healthy foods. Your doctor will tell you what foods to eat.  Stay at a healthy weight.  Lower your stress level.  Do not use illegal drugs. Alcohol use  Do not drink alcohol if: ? Your doctor tells you not to drink. ? You are pregnant, may be pregnant, or are planning to become pregnant.  If you drink alcohol: ? Limit how much you use to:  0-1 drink a day for women.  0-2 drinks a day for men. ? Know how much alcohol is in your drink. In the [place] one drink  equals one 12 oz bottle of beer (355 mL), one 5 oz glass of wine (148 mL), or one 1 oz glass of hard liquor (44 mL). General instructions  Work with your doctor to treat other problems you may have, such as diabetes or high blood pressure.  Get screened for depression. Get treatment if needed.  Keep your vaccines up to date. Get the flu shot (influenza vaccine) every year.  Keep all follow-up visits as told by your doctor. This is important. Contact a doctor if:  You feel very sad.  You have trouble doing your daily activities. Get help right away if:  You have sudden, unexplained discomfort in your chest, arms, back, neck, jaw, or upper body.  You have shortness of breath.  You have sudden sweating or clammy skin.  You feel like you may vomit.  You vomit.  You feel tired or weak.  You get light-headed or dizzy.  You feel your heart beating fast.  You feel your heart skipping  beats.  You have blood pressure that is higher than 180/120. These symptoms may be an emergency. Do not wait to see if the symptoms will go away. Get medical help right away. Call your local emergency services (911 in the [place] Do not drive yourself to the hospital. Summary  A heart attack occurs when blood and oxygen supply to the heart is cut off.  Do not take NSAIDs unless your doctor says it is okay.  Do not smoke. Avoid secondhand smoke.  Exercise regularly. Ask your doctor about a cardiac rehab program. This information is not intended to replace advice given to you by your health care provider. Make sure you discuss any questions you have with your health care provider. Document Revised: 02/23/2019 Document Reviewed: 02/23/2019 Elsevier Patient Education  [place] Not take cialis (tadalafil) anymore because you are on imdur

## 2020-03-14 DIAGNOSIS — I350 Nonrheumatic aortic (valve) stenosis: Secondary | ICD-10-CM | POA: Insufficient documentation

## 2020-03-14 DIAGNOSIS — I700 Atherosclerosis of aorta: Secondary | ICD-10-CM | POA: Insufficient documentation

## 2020-03-18 DIAGNOSIS — Z951 Presence of aortocoronary bypass graft: Secondary | ICD-10-CM

## 2020-03-18 DIAGNOSIS — Z952 Presence of prosthetic heart valve: Secondary | ICD-10-CM

## 2020-03-18 HISTORY — DX: Presence of prosthetic heart valve: Z95.2

## 2020-03-18 HISTORY — DX: Presence of aortocoronary bypass graft: Z95.1

## 2020-03-18 HISTORY — PX: CORONARY ARTERY BYPASS GRAFT: SHX141

## 2020-03-18 HISTORY — PX: AORTIC VALVE REPLACEMENT: SHX41

## 2020-03-19 DIAGNOSIS — R739 Hyperglycemia, unspecified: Secondary | ICD-10-CM | POA: Insufficient documentation

## 2020-03-20 DIAGNOSIS — G8918 Other acute postprocedural pain: Secondary | ICD-10-CM | POA: Insufficient documentation

## 2020-03-20 DIAGNOSIS — Z951 Presence of aortocoronary bypass graft: Secondary | ICD-10-CM | POA: Insufficient documentation

## 2020-03-20 DIAGNOSIS — I5020 Unspecified systolic (congestive) heart failure: Secondary | ICD-10-CM | POA: Insufficient documentation

## 2020-03-20 DIAGNOSIS — G4700 Insomnia, unspecified: Secondary | ICD-10-CM | POA: Insufficient documentation

## 2020-03-21 ENCOUNTER — Ambulatory Visit: Payer: Medicare Other | Admitting: Family

## 2020-04-15 ENCOUNTER — Other Ambulatory Visit: Payer: Self-pay

## 2020-04-15 ENCOUNTER — Encounter: Payer: Medicare Other | Attending: Internal Medicine | Admitting: *Deleted

## 2020-04-15 DIAGNOSIS — Z951 Presence of aortocoronary bypass graft: Secondary | ICD-10-CM | POA: Insufficient documentation

## 2020-04-15 DIAGNOSIS — Z7982 Long term (current) use of aspirin: Secondary | ICD-10-CM | POA: Insufficient documentation

## 2020-04-15 DIAGNOSIS — I10 Essential (primary) hypertension: Secondary | ICD-10-CM | POA: Insufficient documentation

## 2020-04-15 DIAGNOSIS — Z79899 Other long term (current) drug therapy: Secondary | ICD-10-CM | POA: Insufficient documentation

## 2020-04-15 NOTE — Progress Notes (Signed)
Initial telephone orientation completed. Diagnosis can be found in [facility] 4/10. EP orientation scheduled for 3/25 at 1pm

## 2020-04-19 ENCOUNTER — Other Ambulatory Visit: Payer: Self-pay

## 2020-04-19 VITALS — Ht 69.5 in | Wt 168.8 lb

## 2020-04-19 DIAGNOSIS — I10 Essential (primary) hypertension: Secondary | ICD-10-CM | POA: Diagnosis not present

## 2020-04-19 DIAGNOSIS — Z7982 Long term (current) use of aspirin: Secondary | ICD-10-CM | POA: Diagnosis not present

## 2020-04-19 DIAGNOSIS — Z951 Presence of aortocoronary bypass graft: Secondary | ICD-10-CM

## 2020-04-19 DIAGNOSIS — Z79899 Other long term (current) drug therapy: Secondary | ICD-10-CM | POA: Diagnosis not present

## 2020-04-19 NOTE — Progress Notes (Signed)
Cardiac Individual Treatment Plan  Patient Details  Name: Andre PIZANO Sr. MRN: [id number] Date of Birth: 1941/07/12 Referring Provider:     Cardiac Rehab from 04/19/2020 in [facility] Cardiac and Pulmonary Rehab  Referring Provider  [name]      Initial Encounter Date:    Cardiac Rehab from 04/19/2020 in [facility] Cardiac and Pulmonary Rehab  Date  04/19/20      Visit Diagnosis: S/P CABG x 3  Patient's Home Medications on Admission:  Current Outpatient Medications:  .  Acetaminophen 325 MG CAPS, Take by mouth., Disp: , Rfl:  .  amiodarone (PACERONE) 200 MG tablet, Take 400 mg by mouth three times daily through 4/29.  On, 4/30 reduce to 276m by mouth daily, Disp: , Rfl:  .  aspirin EC 81 MG tablet, Take 81 mg by mouth daily., Disp: , Rfl:  .  atorvastatin (LIPITOR) 40 MG tablet, Take by mouth., Disp: , Rfl:  .  carboxymethylcellulose (REFRESH PLUS) 0.5 % SOLN, Apply to eye., Disp: , Rfl:  .  furosemide (LASIX) 40 MG tablet, Take 1 tablet (40 mg total) by mouth daily., Disp: 30 tablet, Rfl: 0 .  isosorbide mononitrate (IMDUR) 30 MG 24 hr tablet, Take 1 tablet (30 mg total) by mouth daily. (Patient not taking: Reported on 04/15/2020), Disp: 30 tablet, Rfl: 0 .  lisinopril (ZESTRIL) 5 MG tablet, Take 1 tablet (5 mg total) by mouth daily., Disp: 30 tablet, Rfl: 0 .  metoprolol tartrate (LOPRESSOR) 50 MG tablet, Take 1 tablet (50 mg total) by mouth 2 (two) times daily., Disp: 60 tablet, Rfl: 0 .  spironolactone (ALDACTONE) 25 MG tablet, Take 0.5 tablets (12.5 mg total) by mouth daily. (Patient not taking: Reported on 04/15/2020), Disp: 15 tablet, Rfl: 0 .  temazepam (RESTORIL) 15 MG capsule, Take 15 mg by mouth at bedtime as needed., Disp: , Rfl:  .  vitamin B-12 (CYANOCOBALAMIN) 250 MCG tablet, Take 250 mcg by mouth as needed., Disp: , Rfl:   Past Medical History: Past Medical History:  Diagnosis Date  . Bursitis   . Elevated lipids   . Hypertension   . SOB (shortness of breath)      Tobacco Use: Social History   Tobacco Use  Smoking Status Never Smoker  Smokeless Tobacco Never Used    Labs: Recent Review [employer], physiological   Labs for ITP Cardiac and Pulmonary Rehab Latest Ref Rng & Units 03/06/2020   Cholestrol 0 - 200 mg/dL 144   LDLCALC 0 - 99 mg/dL 86   HDL >40 mg/dL 46   Trlycerides <150 mg/dL 62   Hemoglobin A1c 4.8 - 5.6 % 5.4       Exercise Target Goals: Exercise Program Goal: Individual exercise prescription set using results from initial 6 min walk test and THRR while considering  patient's activity barriers and safety.   Exercise Prescription Goal: Initial exercise prescription builds to 30-45 minutes a day of aerobic activity, 2-3 days per week.  Home exercise guidelines will be given to patient during program as part of exercise prescription that the participant will acknowledge.   Education: Aerobic Exercise & Resistance Training: - Gives group verbal and written instruction on the various components of exercise. Focuses on aerobic and resistive training programs and the benefits of this training and how to safely progress through these programs..   Education: Exercise & Equipment Safety: - Individual verbal instruction and demonstration of equipment use and safety with use of the equipment.   Cardiac Rehab from 04/19/2020 in  [facility]  Date  04/19/20  Educator  AS  Instruction Review Code  1- Verbalizes Understanding      Education: Exercise Physiology & General Exercise Guidelines: - Group verbal and written instruction with models to review the exercise physiology of the cardiovascular system and associated critical values. Provides general exercise guidelines with specific guidelines to those with heart or lung disease.    Education: Flexibility, Balance, Mind/Body Relaxation: Provides group verbal/written instruction on the benefits of flexibility and balance training, including mind/body exercise modes such  as yoga, pilates and tai chi.  Demonstration and skill practice provided.   Activity Barriers & Risk Stratification: Activity Barriers & Cardiac Risk Stratification - 04/15/20 1312      Activity Barriers & Cardiac Risk Stratification   Activity Barriers  Back Problems   messed up lower lumbar region in high school   Cardiac Risk Stratification  High       6 Minute Walk: 6 Minute Walk    Row Name 04/19/20 1457         6 Minute Walk   Phase  Initial     Distance  890 feet     Walk Time  6 minutes     # of Rest Breaks  0     MPH  1.7     METS  1.89     RPE  8     Perceived Dyspnea   0     VO2 Peak  6.6     Symptoms  Yes (comment)     Comments  L leg sore where they took vein out     Resting HR  75 bpm     Resting BP  102/52     Resting Oxygen Saturation   98 %     Exercise Oxygen Saturation  during 6 min walk  98 %     Max Ex. HR  95 bpm     Max Ex. BP  126/60     2 Minute Post BP  106/58        Oxygen Initial Assessment:   Oxygen Re-Evaluation:   Oxygen Discharge (Final Oxygen Re-Evaluation):   Initial Exercise Prescription: Initial Exercise Prescription - 04/19/20 1500      Date of Initial Exercise RX and Referring Provider   Date  04/19/20    Referring Provider  [name]      Treadmill   MPH  1.7    Grade  0    Minutes  15    METs  2.3      NuStep   Level  1    SPM  80    Minutes  15    METs  2      REL-XR   Level  1    Speed  50    Minutes  15    METs  2      Prescription Details   Frequency (times per week)  3    Duration  Progress to 30 minutes of continuous aerobic without signs/symptoms of physical distress      Intensity   THRR 40-80% of Max Heartrate  102-128    Ratings of Perceived Exertion  11-13    Perceived Dyspnea  0-4      Resistance Training   Training Prescription  Yes    Weight  3 lb    Reps  10-15       Perform Capillary Blood Glucose checks as needed.  Exercise Prescription  Changes: Exercise Prescription  Changes    Row Name 04/19/20 1500             Response to Exercise   Blood Pressure (Admit)  102/52       Blood Pressure (Exercise)  126/60       Blood Pressure (Exit)  106/58       Heart Rate (Admit)  75 bpm       Heart Rate (Exercise)  95 bpm       Heart Rate (Exit)  87 bpm       Oxygen Saturation (Admit)  98 %       Oxygen Saturation (Exercise)  98 %       Rating of Perceived Exertion (Exercise)  8       Perceived Dyspnea (Exercise)  0       Symptoms  L leg sore          Exercise Comments:   Exercise Goals and Review: Exercise Goals    Row Name 04/19/20 1510             Exercise Goals   Increase Physical Activity  Yes       Intervention  Provide advice, education, support and counseling about physical activity/exercise needs.;Develop an individualized exercise prescription for aerobic and resistive training based on initial evaluation findings, risk stratification, comorbidities and participant's personal goals.       Expected Outcomes  Short Term: Attend rehab on a regular basis to increase amount of physical activity.;Long Term: Add in home exercise to make exercise part of routine and to increase amount of physical activity.;Long Term: Exercising regularly at least 3-5 days a week.       Increase Strength and Stamina  Yes       Intervention  Provide advice, education, support and counseling about physical activity/exercise needs.;Develop an individualized exercise prescription for aerobic and resistive training based on initial evaluation findings, risk stratification, comorbidities and participant's personal goals.       Expected Outcomes  Short Term: Increase workloads from initial exercise prescription for resistance, speed, and METs.;Short Term: Perform resistance training exercises routinely during rehab and add in resistance training at home;Long Term: Improve cardiorespiratory fitness, muscular endurance and strength as measured by increased METs and functional  capacity (6MWT)       Able to understand and use rate of perceived exertion (RPE) scale  Yes       Intervention  Provide education and explanation on how to use RPE scale       Expected Outcomes  Short Term: Able to use RPE daily in rehab to express subjective intensity level;Long Term:  Able to use RPE to guide intensity level when exercising independently       Knowledge and understanding of Target Heart Rate Range (THRR)  Yes       Intervention  Provide education and explanation of THRR including how the numbers were predicted and where they are located for reference       Expected Outcomes  Short Term: Able to state/look up THRR;Short Term: Able to use daily as guideline for intensity in rehab;Long Term: Able to use THRR to govern intensity when exercising independently       Able to check pulse independently  Yes       Intervention  Provide education and demonstration on how to check pulse in carotid and radial arteries.;Review the importance of being able to check your own pulse for safety during independent exercise  Expected Outcomes  Short Term: Able to explain why pulse checking is important during independent exercise;Long Term: Able to check pulse independently and accurately       Understanding of Exercise Prescription  Yes       Intervention  Provide education, explanation, and written materials on patient's individual exercise prescription       Expected Outcomes  Short Term: Able to explain program exercise prescription;Long Term: Able to explain home exercise prescription to exercise independently          Exercise Goals Re-Evaluation :   Discharge Exercise Prescription (Final Exercise Prescription Changes): Exercise Prescription Changes - 04/19/20 1500      Response to Exercise   Blood Pressure (Admit)  102/52    Blood Pressure (Exercise)  126/60    Blood Pressure (Exit)  106/58    Heart Rate (Admit)  75 bpm    Heart Rate (Exercise)  95 bpm    Heart Rate (Exit)  87  bpm    Oxygen Saturation (Admit)  98 %    Oxygen Saturation (Exercise)  98 %    Rating of Perceived Exertion (Exercise)  8    Perceived Dyspnea (Exercise)  0    Symptoms  L leg sore       Nutrition:  Target Goals: Understanding of nutrition guidelines, daily intake of sodium <1512m, cholesterol <2028m calories 30% from fat and 7% or less from saturated fats, daily to have 5 or more servings of fruits and vegetables.  Education: Controlling Sodium/Reading Food Labels -Group verbal and written material supporting the discussion of sodium use in heart healthy nutrition. Review and explanation with models, verbal and written materials for utilization of the food label.   Education: General Nutrition Guidelines/Fats and Fiber: -Group instruction provided by verbal, written material, models and posters to present the general guidelines for heart healthy nutrition. Gives an explanation and review of dietary fats and fiber.   Biometrics: Pre Biometrics - 04/19/20 1514      Pre Biometrics   Height  5' 9.5" (1.765 m)    Weight  168 lb 12.8 oz (76.6 kg)    BMI (Calculated)  24.58    Single Leg Stand  22.41 seconds        Nutrition Therapy Plan and Nutrition Goals:   Nutrition Assessments: Nutrition Assessments - 04/19/20 1517      MEDFICTS Scores   Pre Score  19       MEDIFICTS Score Key:          ?70 Need to make dietary changes          40-70 Heart Healthy Diet         ? 40 Therapeutic Level Cholesterol Diet  Nutrition Goals Re-Evaluation:   Nutrition Goals Discharge (Final Nutrition Goals Re-Evaluation):   Psychosocial: Target Goals: Acknowledge presence or absence of significant depression and/or stress, maximize coping skills, provide positive support system. Participant is able to verbalize types and ability to use techniques and skills needed for reducing stress and depression.   Education: Depression - Provides group verbal and written instruction on the  correlation between heart/lung disease and depressed mood, treatment options, and the stigmas associated with seeking treatment.   Education: Sleep Hygiene -Provides group verbal and written instruction about how sleep can affect your health.  Define sleep hygiene, discuss sleep cycles and impact of sleep habits. Review good sleep hygiene tips.     Education: Stress and Anxiety: - Provides group verbal and written instruction about the  health risks of elevated stress and causes of high stress.  Discuss the correlation between heart/lung disease and anxiety and treatment options. Review healthy ways to manage with stress and anxiety.    Initial Review & Psychosocial Screening: Initial Psych Review & Screening - 04/15/20 1305      Initial Review   Current issues with  Current Stress Concerns      Family Dynamics   Good Support System?  Yes      Barriers   Psychosocial barriers to participate in program  There are no identifiable barriers or psychosocial needs.;The patient should benefit from training in stress management and relaxation.      Screening Interventions   Interventions  Encouraged to exercise;To provide support and resources with identified psychosocial needs;Provide feedback about the scores to participant    Expected Outcomes  Short Term goal: Utilizing psychosocial counselor, staff and physician to assist with identification of specific Stressors or current issues interfering with healing process. Setting desired goal for each stressor or current issue identified.;Long Term Goal: Stressors or current issues are controlled or eliminated.;Short Term goal: Identification and review with participant of any Quality of Life or Depression concerns found by scoring the questionnaire.;Long Term goal: The participant improves quality of Life and PHQ9 Scores as seen by post scores and/or verbalization of changes       Quality of Life Scores:  Quality of Life - 04/19/20 1515       Quality of Life   Select  Quality of Life      Quality of Life Scores   Health/Function Pre  18.8 %    Socioeconomic Pre  29 %    Psych/Spiritual Pre  28.29 %    Family Pre  18 %    GLOBAL Pre  22.25 %      Scores of 19 and below usually indicate a poorer quality of life in these areas.  A difference of  2-3 points is a clinically meaningful difference.  A difference of 2-3 points in the total score of the Quality of Life Index has been associated with significant improvement in overall quality of life, self-image, physical symptoms, and general health in studies assessing change in quality of life.  PHQ-9: Recent Review Flowsheet Data    Depression screen [facility] 2/9 04/19/2020   Decreased Interest 0   Down, Depressed, Hopeless 1   PHQ - 2 Score 1   Altered sleeping 1   Tired, decreased energy 1   Change in appetite 1   Feeling bad or failure about yourself  1   Trouble concentrating 0   Moving slowly or fidgety/restless 0   Suicidal thoughts 0   PHQ-9 Score 5   Difficult doing work/chores Somewhat difficult     Interpretation of Total Score  Total Score Depression Severity:  1-4 = Minimal depression, 5-9 = Mild depression, 10-14 = Moderate depression, 15-19 = Moderately severe depression, 20-27 = Severe depression   Psychosocial Evaluation and Intervention: Psychosocial Evaluation - 04/15/20 1317      Psychosocial Evaluation & Interventions   Comments  Ronalee Belts reports doing well, although this heart surgery came as a surprise to him. He said the symptoms hit him suddenly. He states he is feeling much better, but wants to get back to his "everyday stuff" (i.e. mowing, taking care of his properties). His wife and he are excited about him starting Cardiac Rehab and gaining a better understanding of heart healthy living.    Expected Outcomes  Short: attend  Cardiac Rehab for education and exercise. Long: maintain positive self care habits.    Continue Psychosocial Services   Follow up  required by staff       Psychosocial Re-Evaluation:   Psychosocial Discharge (Final Psychosocial Re-Evaluation):   Vocational Rehabilitation: Provide vocational rehab assistance to qualifying candidates.   Vocational Rehab Evaluation & Intervention: Vocational Rehab - 04/15/20 1305      Initial Vocational Rehab Evaluation & Intervention   Assessment shows need for Vocational Rehabilitation  No       Education: Education Goals: Education classes will be provided on a variety of topics geared toward better understanding of heart health and risk factor modification. Participant will state understanding/return demonstration of topics presented as noted by education test scores.  Learning Barriers/Preferences: Learning Barriers/Preferences - 04/15/20 1305      Learning Barriers/Preferences   Learning Barriers  None    Learning Preferences  None       General Cardiac Education Topics:  AED/CPR: - Group verbal and written instruction with the use of models to demonstrate the basic use of the AED with the basic ABC's of resuscitation.   Anatomy & Physiology of the Heart: - Group verbal and written instruction and models provide basic cardiac anatomy and physiology, with the coronary electrical and arterial systems. Review of Valvular disease and Heart Failure   Cardiac Procedures: - Group verbal and written instruction to review commonly prescribed medications for heart disease. Reviews the medication, class of the drug, and side effects. Includes the steps to properly store meds and maintain the prescription regimen. (beta blockers and nitrates)   Cardiac Medications I: - Group verbal and written instruction to review commonly prescribed medications for heart disease. Reviews the medication, class of the drug, and side effects. Includes the steps to properly store meds and maintain the prescription regimen.   Cardiac Medications II: -Group verbal and written instruction  to review commonly prescribed medications for heart disease. Reviews the medication, class of the drug, and side effects. (all other drug classes)    Go Sex-Intimacy & Heart Disease, Get SMART - Goal Setting: - Group verbal and written instruction through game format to discuss heart disease and the return to sexual intimacy. Provides group verbal and written material to discuss and apply goal setting through the application of the S.M.A.R.T. Method.   Other Matters of the Heart: - Provides group verbal, written materials and models to describe Stable Angina and Peripheral Artery. Includes description of the disease process and treatment options available to the cardiac patient.   Infection Prevention: - Provides verbal and written material to individual with discussion of infection control including proper hand washing and proper equipment cleaning during exercise session.   Cardiac Rehab from 04/19/2020 in [facility] Cardiac and Pulmonary Rehab  Date  04/19/20  Educator  AS  Instruction Review Code  1- Verbalizes Understanding      Falls Prevention: - Provides verbal and written material to individual with discussion of falls prevention and safety.   Cardiac Rehab from 04/19/2020 in [facility] Cardiac and Pulmonary Rehab  Date  04/19/20  Educator  AS  Instruction Review Code  1- Verbalizes Understanding      Other: -Provides group and verbal instruction on various topics (see comments)   Knowledge Questionnaire Score: Knowledge Questionnaire Score - 04/19/20 1514      Knowledge Questionnaire Score   Pre Score  21/26  angina/nutrition /exercise       Core Components/Risk Factors/Patient Goals at Admission: Personal Goals and  Risk Factors at Admission - 04/19/20 1519      Core Components/Risk Factors/Patient Goals on Admission    Weight Management  Yes;Weight Maintenance    Intervention  Weight Management: Develop a combined nutrition and exercise program designed to reach desired  caloric intake, while maintaining appropriate intake of nutrient and fiber, sodium and fats, and appropriate energy expenditure required for the weight goal.;Weight Management: Provide education and appropriate resources to help participant work on and attain dietary goals.    Admit Weight  168 lb 12.8 oz (76.6 kg)    Goal Weight: Short Term  165 lb (74.8 kg)    Goal Weight: Long Term  165 lb (74.8 kg)    Expected Outcomes  Short Term: Continue to assess and modify interventions until short term weight is achieved;Long Term: Adherence to nutrition and physical activity/exercise program aimed toward attainment of established weight goal;Weight Maintenance: Understanding of the daily nutrition guidelines, which includes 25-35% calories from fat, 7% or less cal from saturated fats, less than 281m cholesterol, less than 1.5gm of sodium, & 5 or more servings of fruits and vegetables daily    Intervention  Provide education on lifestyle modifcations including regular physical activity/exercise, weight management, moderate sodium restriction and increased consumption of fresh fruit, vegetables, and low fat dairy, alcohol moderation, and smoking cessation.;Monitor prescription use compliance.    Expected Outcomes  Short Term: Continued assessment and intervention until BP is < 140/942mHG in hypertensive participants. < 130/804mG in hypertensive participants with diabetes, heart failure or chronic kidney disease.;Long Term: Maintenance of blood pressure at goal levels.    Intervention  Provide education and support for participant on nutrition & aerobic/resistive exercise along with prescribed medications to achieve LDL <6m79mDL >40mg5m Expected Outcomes  Short Term: Participant states understanding of desired cholesterol values and is compliant with medications prescribed. Participant is following exercise prescription and nutrition guidelines.;Long Term: Cholesterol controlled with medications as  prescribed, with individualized exercise RX and with personalized nutrition plan. Value goals: LDL < 6mg,80m > 40 mg.       Education:Diabetes - Individual verbal and written instruction to review signs/symptoms of diabetes, desired ranges of glucose level fasting, after meals and with exercise. Acknowledge that pre and post exercise glucose checks will be done for 3 sessions at entry of program.   Education: Know Your Numbers and Risk Factors: -Group verbal and written instruction about important numbers in your health.  Discussion of what are risk factors and how they play a role in the disease process.  Review of Cholesterol, Blood Pressure, Diabetes, and BMI and the role they play in your overall health.   Core Components/Risk Factors/Patient Goals Review:    Core Components/Risk Factors/Patient Goals at Discharge (Final Review):    ITP Comments: ITP Comments    Row Name 04/15/20 1302 04/19/20 1518         ITP Comments  Initial telephone orientation completed. Diagnosis can be found in [facility] 4/10. EP orientation scheduled for 3/25 at 1pm  Completed 6MWT and gym orientation.  Initial ITP created and sent for review to Dr. Mark MEmily Filbertcal Director.         Comments: initial ITP

## 2020-04-19 NOTE — Patient Instructions (Signed)
Patient Instructions  Patient Details  Name: Andre FREIER Sr. MRN: [id number] Date of Birth: 05-Jan-1941 Referring Provider:  Corey Skains, MD  Below are your personal goals for exercise, nutrition, and risk factors. Our goal is to help you stay on track towards obtaining and maintaining these goals. We will be discussing your progress on these goals with you throughout the program.  Initial Exercise Prescription: Initial Exercise Prescription - 04/19/20 1500      Date of Initial Exercise RX and Referring Provider   Date  04/19/20    Referring Provider  [name]      Treadmill   MPH  1.7    Grade  0    Minutes  15    METs  2.3      NuStep   Level  1    SPM  80    Minutes  15    METs  2      REL-XR   Level  1    Speed  50    Minutes  15    METs  2      Prescription Details   Frequency (times per week)  3    Duration  Progress to 30 minutes of continuous aerobic without signs/symptoms of physical distress      Intensity   THRR 40-80% of Max Heartrate  102-128    Ratings of Perceived Exertion  11-13    Perceived Dyspnea  0-4      Resistance Training   Training Prescription  Yes    Weight  3 lb    Reps  10-15       Exercise Goals: Frequency: Be able to perform aerobic exercise two to three times per week in program working toward 2-5 days per week of home exercise.  Intensity: Work with a perceived exertion of 11 (fairly light) - 15 (hard) while following your exercise prescription.  We will make changes to your prescription with you as you progress through the program.   Duration: Be able to do 30 to 45 minutes of continuous aerobic exercise in addition to a 5 minute warm-up and a 5 minute cool-down routine.   Nutrition Goals: Your personal nutrition goals will be established when you do your nutrition analysis with the dietician.  The following are general nutrition guidelines to follow: Cholesterol < 200mg /day Sodium < 1500mg /day Fiber: Men over 50  yrs - 30 grams per day  Personal Goals: Personal Goals and Risk Factors at Admission - 04/19/20 1519      Core Components/Risk Factors/Patient Goals on Admission    Weight Management  Yes;Weight Maintenance    Intervention  Weight Management: Develop a combined nutrition and exercise program designed to reach desired caloric intake, while maintaining appropriate intake of nutrient and fiber, sodium and fats, and appropriate energy expenditure required for the weight goal.;Weight Management: Provide education and appropriate resources to help participant work on and attain dietary goals.    Admit Weight  168 lb 12.8 oz (76.6 kg)    Goal Weight: Short Term  165 lb (74.8 kg)    Goal Weight: Long Term  165 lb (74.8 kg)    Expected Outcomes  Short Term: Continue to assess and modify interventions until short term weight is achieved;Long Term: Adherence to nutrition and physical activity/exercise program aimed toward attainment of established weight goal;Weight Maintenance: Understanding of the daily nutrition guidelines, which includes 25-35% calories from fat, 7% or less cal from saturated fats, less than 200mg  cholesterol, less  than 1.5gm of sodium, & 5 or more servings of fruits and vegetables daily    Intervention  Provide education on lifestyle modifcations including regular physical activity/exercise, weight management, moderate sodium restriction and increased consumption of fresh fruit, vegetables, and low fat dairy, alcohol moderation, and smoking cessation.;Monitor prescription use compliance.    Expected Outcomes  Short Term: Continued assessment and intervention until BP is < 140/85mm HG in hypertensive participants. < 130/108mm HG in hypertensive participants with diabetes, heart failure or chronic kidney disease.;Long Term: Maintenance of blood pressure at goal levels.    Intervention  Provide education and support for participant on nutrition & aerobic/resistive exercise along with prescribed  medications to achieve LDL 70mg , HDL >40mg .    Expected Outcomes  Short Term: Participant states understanding of desired cholesterol values and is compliant with medications prescribed. Participant is following exercise prescription and nutrition guidelines.;Long Term: Cholesterol controlled with medications as prescribed, with individualized exercise RX and with personalized nutrition plan. Value goals: LDL < 70mg , HDL > 40 mg.       Tobacco Use Initial Evaluation: Social History   Tobacco Use  Smoking Status Never Smoker  Smokeless Tobacco Never Used    Exercise Goals and Review: Exercise Goals    Row Name 04/19/20 1510             Exercise Goals   Increase Physical Activity  Yes       Intervention  Provide advice, education, support and counseling about physical activity/exercise needs.;Develop an individualized exercise prescription for aerobic and resistive training based on initial evaluation findings, risk stratification, comorbidities and participant's personal goals.       Expected Outcomes  Short Term: Attend rehab on a regular basis to increase amount of physical activity.;Long Term: Add in home exercise to make exercise part of routine and to increase amount of physical activity.;Long Term: Exercising regularly at least 3-5 days a week.       Increase Strength and Stamina  Yes       Intervention  Provide advice, education, support and counseling about physical activity/exercise needs.;Develop an individualized exercise prescription for aerobic and resistive training based on initial evaluation findings, risk stratification, comorbidities and participant's personal goals.       Expected Outcomes  Short Term: Increase workloads from initial exercise prescription for resistance, speed, and METs.;Short Term: Perform resistance training exercises routinely during rehab and add in resistance training at home;Long Term: Improve cardiorespiratory fitness, muscular endurance and  strength as measured by increased METs and functional capacity (6MWT)       Able to understand and use rate of perceived exertion (RPE) scale  Yes       Intervention  Provide education and explanation on how to use RPE scale       Expected Outcomes  Short Term: Able to use RPE daily in rehab to express subjective intensity level;Long Term:  Able to use RPE to guide intensity level when exercising independently       Knowledge and understanding of Target Heart Rate Range (THRR)  Yes       Intervention  Provide education and explanation of THRR including how the numbers were predicted and where they are located for reference       Expected Outcomes  Short Term: Able to state/look up THRR;Short Term: Able to use daily as guideline for intensity in rehab;Long Term: Able to use THRR to govern intensity when exercising independently       Able to check pulse independently  Yes       Intervention  Provide education and demonstration on how to check pulse in carotid and radial arteries.;Review the importance of being able to check your own pulse for safety during independent exercise       Expected Outcomes  Short Term: Able to explain why pulse checking is important during independent exercise;Long Term: Able to check pulse independently and accurately       Understanding of Exercise Prescription  Yes       Intervention  Provide education, explanation, and written materials on patient's individual exercise prescription       Expected Outcomes  Short Term: Able to explain program exercise prescription;Long Term: Able to explain home exercise prescription to exercise independently          Copy of goals given to participant.

## 2020-04-26 ENCOUNTER — Encounter (INDEPENDENT_AMBULATORY_CARE_PROVIDER_SITE_OTHER): Payer: Medicare Other

## 2020-04-26 ENCOUNTER — Ambulatory Visit (INDEPENDENT_AMBULATORY_CARE_PROVIDER_SITE_OTHER): Payer: Medicare Other | Admitting: Vascular Surgery

## 2020-04-27 ENCOUNTER — Encounter: Payer: Medicare Other | Attending: Internal Medicine | Admitting: *Deleted

## 2020-04-27 ENCOUNTER — Other Ambulatory Visit: Payer: Self-pay

## 2020-04-27 DIAGNOSIS — I10 Essential (primary) hypertension: Secondary | ICD-10-CM | POA: Insufficient documentation

## 2020-04-27 DIAGNOSIS — Z951 Presence of aortocoronary bypass graft: Secondary | ICD-10-CM | POA: Diagnosis not present

## 2020-04-27 DIAGNOSIS — Z79899 Other long term (current) drug therapy: Secondary | ICD-10-CM | POA: Insufficient documentation

## 2020-04-27 DIAGNOSIS — Z7982 Long term (current) use of aspirin: Secondary | ICD-10-CM | POA: Diagnosis not present

## 2020-04-27 NOTE — Progress Notes (Signed)
Daily Session Note ° °Patient Details  °Name: Andre L Wolfgang Sr. °MRN: [id number] °Date of Birth: 02/24/1941 °Referring Provider:   °  Cardiac Rehab from 04/19/2020 in [facility]  °Referring Provider  Kowalski  °  ° ° °Encounter Date: 04/27/2020 ° °Check In: °Session Check In - 04/27/20 1506   °  ° Check-In  ° Supervising physician immediately available to respond to emergencies  See telemetry face sheet for immediately available ER MD   ° Location  [facility]   ° Staff Present  Meredith Craven, RN BSN;Melissa Caiola RDN, LDN;Amanda Sommer, BA, ACSM CEP, Exercise Physiologist   ° Virtual Visit  No   ° Medication changes reported      No   ° Fall or balance concerns reported     No   ° Warm-up and Cool-down  Performed on first and last piece of equipment   ° Resistance Training Performed  Yes   ° VAD Patient?  No   ° PAD/SET Patient?  No   °  ° Pain Assessment  ° Currently in Pain?  No/denies   °  ° ° ° ° ° °Social History  ° °Tobacco Use  °Smoking Status Never Smoker  °Smokeless Tobacco Never Used  ° ° °Goals Met:  °Independence with exercise equipment °Exercise tolerated well °No report of cardiac concerns or symptoms °Strength training completed today ° °Goals Unmet:  °Not Applicable ° °Comments: First full day of exercise!  Patient was oriented to gym and equipment including functions, settings, policies, and procedures.  Patient's individual exercise prescription and treatment plan were reviewed.  All starting workloads were established based on the results of the 6 minute walk test done at initial orientation visit.  The plan for exercise progression was also introduced and progression will be customized based on patient's performance and goals. ° ° ° °Dr. Mark Miller is Medical Director for [facility] and [facility]. °

## 2020-04-28 ENCOUNTER — Encounter: Payer: Medicare Other | Admitting: *Deleted

## 2020-04-28 ENCOUNTER — Other Ambulatory Visit: Payer: Self-pay

## 2020-04-28 DIAGNOSIS — Z951 Presence of aortocoronary bypass graft: Secondary | ICD-10-CM | POA: Diagnosis not present

## 2020-04-28 NOTE — Progress Notes (Signed)
Daily Session Note  Patient Details  Name: Andre VITALI Sr. MRN: [id number] Date of Birth: [birth year] Referring Provider:     Cardiac Rehab from 04/19/2020 in [facility]  Referring Provider  [name]      Encounter Date: 04/28/2020  Check In: Session Check In - 04/28/20 1513      Check-In   Supervising physician immediately available to respond to emergencies  See telemetry face sheet for immediately available ER MD    Location  [facility]    Staff Present  Renita Papa, RN BSN;Laureen Owens Shark, BS, RRT, CPFT;Amanda Oletta Darter, BA, ACSM CEP, Exercise Physiologist    Virtual Visit  No    Medication changes reported      No    Fall or balance concerns reported     No    Warm-up and Cool-down  Performed on first and last piece of equipment    Resistance Training Performed  Yes    VAD Patient?  No    PAD/SET Patient?  No      Pain Assessment   Currently in Pain?  No/denies          Social History   Tobacco Use  Smoking Status Never Smoker  Smokeless Tobacco Never Used    Goals Met:  Independence with exercise equipment Exercise tolerated well No report of cardiac concerns or symptoms Strength training completed today  Goals Unmet:  Not Applicable  Comments: Pt able to follow exercise prescription today without complaint.  Will continue to monitor for progression.    Dr. Emily Filbert is Medical Director for [place] and [facility].

## 2020-05-02 ENCOUNTER — Other Ambulatory Visit: Payer: Self-pay

## 2020-05-02 DIAGNOSIS — Z951 Presence of aortocoronary bypass graft: Secondary | ICD-10-CM | POA: Diagnosis not present

## 2020-05-02 NOTE — Progress Notes (Signed)
Daily Session Note  Patient Details  Name: Andre HAMMEN Sr. MRN: [id number] Date of Birth: 1941/11/11 Referring Provider:     Cardiac Rehab from 04/19/2020 in [facility]  Referring Provider  [name]      Encounter Date: 05/02/2020  Check In: Session Check In - 05/02/20 1516      Check-In   Supervising physician immediately available to respond to emergencies  See telemetry face sheet for immediately available ER MD    Location  [facility]    Staff Present  Basilia Jumbo, RN, BSN;Jessica [place], MA, RCEP, CCRP, [place], BS, ACSM CEP, Exercise Physiologist;Melissa [place] RDN, LDN;Joseph Lou [place], [place] Exercise Physiologist    Virtual Visit  No    Medication changes reported      No    Fall or balance concerns reported     No    Tobacco Cessation  No Change    Warm-up and Cool-down  Performed on first and last piece of equipment    Resistance Training Performed  Yes    VAD Patient?  No    PAD/SET Patient?  No      Pain Assessment   Currently in Pain?  No/denies          Social History   Tobacco Use  Smoking Status Never Smoker  Smokeless Tobacco Never Used    Goals Met:  Independence with exercise equipment Exercise tolerated well No report of cardiac concerns or symptoms  Goals Unmet:  Not Applicable  Comments: Pt able to follow exercise prescription today without complaint.  Will continue to monitor for progression.    Dr. Emily Filbert is Medical Director for [place] and [facility].

## 2020-05-05 ENCOUNTER — Other Ambulatory Visit: Payer: Self-pay

## 2020-05-05 DIAGNOSIS — Z951 Presence of aortocoronary bypass graft: Secondary | ICD-10-CM | POA: Diagnosis not present

## 2020-05-05 NOTE — Progress Notes (Signed)
Daily Session Note  Patient Details  Name: Andre VANZANTEN Sr. MRN: [id number] Date of Birth: 05/20/1941 Referring Provider:     Cardiac Rehab from 04/19/2020 in [facility]  Referring Provider [name]      Encounter Date: 05/05/2020  Check In:  Session Check In - 05/05/20 1505      Check-In   Supervising physician immediately available to respond to emergencies See telemetry face sheet for immediately available ER MD    Location [facility]    Staff Present Vida Rigger RN, BSN;Jessica [place], MA, RCEP, CCRP, Marylynn Pearson, [place] Exercise Physiologist;Joseph Hood RCP,RRT,BSRT    Virtual Visit No    Medication changes reported     No    Fall or balance concerns reported    No    Warm-up and Cool-down Performed on first and last piece of equipment    Resistance Training Performed Yes    VAD Patient? No    PAD/SET Patient? No      Pain Assessment   Currently in Pain? No/denies              Social History   Tobacco Use  Smoking Status Never Smoker  Smokeless Tobacco Never Used    Goals Met:  Proper associated with RPD/PD & O2 Sat Exercise tolerated well No report of cardiac concerns or symptoms Strength training completed today  Goals Unmet:  Not Applicable  Comments: Pt able to follow exercise prescription today without complaint.  Will continue to monitor for progression.   Dr. Emily Filbert is Medical Director for [place] and [facility].

## 2020-05-09 ENCOUNTER — Encounter: Payer: Medicare Other | Admitting: *Deleted

## 2020-05-09 ENCOUNTER — Other Ambulatory Visit: Payer: Self-pay

## 2020-05-09 DIAGNOSIS — Z951 Presence of aortocoronary bypass graft: Secondary | ICD-10-CM

## 2020-05-09 NOTE — Progress Notes (Signed)
Daily Session Note  Patient Details  Name: Andre Murphy. MRN: [id number] Date of Birth: 08-Jul-1941 Referring Provider:     Cardiac Rehab from 04/19/2020 in [facility]  Referring Provider [name]      Encounter Date: 05/09/2020  Check In:  Session Check In - 05/09/20 1525      Check-In   Supervising physician immediately available to respond to emergencies See telemetry face sheet for immediately available ER MD    Location [facility]    Staff Present Renita Papa, RN BSN;Joseph [address] [place], [place], ACSM CEP, Exercise Physiologist    Virtual Visit No    Medication changes reported     No    Fall or balance concerns reported    No    Warm-up and Cool-down Performed on first and last piece of equipment    Resistance Training Performed Yes    VAD Patient? No    PAD/SET Patient? No      Pain Assessment   Currently in Pain? No/denies              Social History   Tobacco Use  Smoking Status Never Smoker  Smokeless Tobacco Never Used    Goals Met:  Independence with exercise equipment Exercise tolerated well No report of cardiac concerns or symptoms Strength training completed today  Goals Unmet:  Not Applicable  Comments: Pt able to follow exercise prescription today without complaint.  Will continue to monitor for progression.    Dr. Emily Filbert is Medical Director for [place] and [facility].

## 2020-05-11 ENCOUNTER — Encounter: Payer: Medicare Other | Admitting: *Deleted

## 2020-05-11 ENCOUNTER — Other Ambulatory Visit: Payer: Self-pay

## 2020-05-11 ENCOUNTER — Encounter: Payer: Self-pay | Admitting: *Deleted

## 2020-05-11 DIAGNOSIS — Z951 Presence of aortocoronary bypass graft: Secondary | ICD-10-CM

## 2020-05-11 NOTE — Progress Notes (Signed)
Cardiac Individual Treatment Plan  Patient Details  Name: Andre COLDEN Sr. MRN: [id number] Date of Birth: 1941-03-14 Referring Provider:     Cardiac Rehab from 04/19/2020 in [facility]iac [facility] Pulmonary Rehab  Referring Provider [name]      Initial Encounter Date:    Cardiac Rehab from 04/19/2020 in [facility] Cardiac and Pulmonary Rehab  Date 04/19/20      Visit Diagnosis: S/P CABG x 3  Patient's Home Medications on Admission:  Current Outpatient Medications:  .  Acetaminophen 325 MG CAPS, Take by mouth., Disp: , Rfl:  .  amiodarone (PACERONE) 200 MG tablet, Take 400 mg by mouth three times daily through 4/29.  On, 4/30 reduce to 25m by mouth daily, Disp: , Rfl:  .  aspirin EC 81 MG tablet, Take 81 mg by mouth daily., Disp: , Rfl:  .  atorvastatin (LIPITOR) 40 MG tablet, Take by mouth., Disp: , Rfl:  .  carboxymethylcellulose (REFRESH PLUS) 0.5 % SOLN, Apply to eye., Disp: , Rfl:  .  furosemide (LASIX) 40 MG tablet, Take 1 tablet (40 mg total) by mouth daily., Disp: 30 tablet, Rfl: 0 .  isosorbide mononitrate (IMDUR) 30 MG 24 hr tablet, Take 1 tablet (30 mg total) by mouth daily. (Patient not taking: Reported on 04/15/2020), Disp: 30 tablet, Rfl: 0 .  lisinopril (ZESTRIL) 5 MG tablet, Take 1 tablet (5 mg total) by mouth daily., Disp: 30 tablet, Rfl: 0 .  metoprolol tartrate (LOPRESSOR) 50 MG tablet, Take 1 tablet (50 mg total) by mouth 2 (two) times daily., Disp: 60 tablet, Rfl: 0 .  spironolactone (ALDACTONE) 25 MG tablet, Take 0.5 tablets (12.5 mg total) by mouth daily. (Patient not taking: Reported on 04/15/2020), Disp: 15 tablet, Rfl: 0 .  temazepam (RESTORIL) 15 MG capsule, Take 15 mg by mouth at bedtime as needed., Disp: , Rfl:  .  vitamin B-12 (CYANOCOBALAMIN) 250 MCG tablet, Take 250 mcg by mouth as needed., Disp: , Rfl:   Past Medical History: Past Medical History:  Diagnosis Date  . Bursitis   . Elevated lipids   . Hypertension   . SOB (shortness of breath)     Tobacco  Use: Social History   Tobacco Use  Smoking Status Never Smoker  Smokeless Tobacco Never Used    Labs: Recent Review [employer], physiological   Labs for ITP Cardiac and Pulmonary Rehab Latest Ref Rng & Units 03/06/2020   Cholestrol 0 - 200 mg/dL 144   LDLCALC 0 - 99 mg/dL 86   HDL >40 mg/dL 46   Trlycerides <150 mg/dL 62   Hemoglobin A1c 4.8 - 5.6 % 5.4       Exercise Target Goals: Exercise Program Goal: Individual exercise prescription set using results from initial 6 min walk test and THRR while considering  patient's activity barriers and safety.   Exercise Prescription Goal: Initial exercise prescription builds to 30-45 minutes a day of aerobic activity, 2-3 days per week.  Home exercise guidelines will be given to patient during program as part of exercise prescription that the participant will acknowledge.   Education: Aerobic Exercise & Resistance Training: - Gives group verbal and written instruction on the various components of exercise. Focuses on aerobic and resistive training programs and the benefits of this training and how to safely progress through these programs..   Education: Exercise & Equipment Safety: - Individual verbal instruction and demonstration of equipment use and safety with use of the equipment.   [facility]  Date 04/19/20  Educator AS  Instruction Review Code 1- Verbalizes Understanding      Education: Exercise Physiology & General Exercise Guidelines: - Group verbal and written instruction with models to review the exercise physiology of the cardiovascular system and associated critical values. Provides general exercise guidelines with specific guidelines to those with heart or lung disease.    Education: Flexibility, Balance, Mind/Body Relaxation: Provides group verbal/written instruction on the benefits of flexibility and balance training, including mind/body exercise modes such as yoga, pilates  and tai chi.  Demonstration and skill practice provided.   Activity Barriers & Risk Stratification:  Activity Barriers & Cardiac Risk Stratification - 04/15/20 1312      Activity Barriers & Cardiac Risk Stratification   Activity Barriers Back Problems   messed up lower lumbar region in high school   Cardiac Risk Stratification High           6 Minute Walk:  6 Minute Walk    Row Name 04/19/20 1457         6 Minute Walk   Phase Initial     Distance 890 feet     Walk Time 6 minutes     # of Rest Breaks 0     MPH 1.7     METS 1.89     RPE 8     Perceived Dyspnea  0     VO2 Peak 6.6     Symptoms Yes (comment)     Comments L leg sore where they took vein out     Resting HR 75 bpm     Resting BP 102/52     Resting Oxygen Saturation  98 %     Exercise Oxygen Saturation  during 6 min walk 98 %     Max Ex. HR 95 bpm     Max Ex. BP 126/60     2 Minute Post BP 106/58            Oxygen Initial Assessment:   Oxygen Re-Evaluation:   Oxygen Discharge (Final Oxygen Re-Evaluation):   Initial Exercise Prescription:  Initial Exercise Prescription - 04/19/20 1500      Date of Initial Exercise RX and Referring Provider   Date 04/19/20    Referring Provider [name]      Treadmill   MPH 1.7    Grade 0    Minutes 15    METs 2.3      NuStep   Level 1    SPM 80    Minutes 15    METs 2      REL-XR   Level 1    Speed 50    Minutes 15    METs 2      Prescription Details   Frequency (times per week) 3    Duration Progress to 30 minutes of continuous aerobic without signs/symptoms of physical distress      Intensity   THRR 40-80% of Max Heartrate 102-128    Ratings of Perceived Exertion 11-13    Perceived Dyspnea 0-4      Resistance Training   Training Prescription Yes    Weight 3 lb    Reps 10-15           Perform Capillary Blood Glucose checks as needed.  Exercise Prescription Changes:  Exercise Prescription Changes    Row Name 04/19/20 1500  05/05/20 1300           Response to Exercise   Blood Pressure (  Admit) 102/52 132/70      Blood Pressure (Exercise) 126/60 158/80      Blood Pressure (Exit) 106/58 124/64      Heart Rate (Admit) 75 bpm 90 bpm      Heart Rate (Exercise) 95 bpm 123 bpm      Heart Rate (Exit) 87 bpm 102 bpm      Oxygen Saturation (Admit) 98 % --      Oxygen Saturation (Exercise) 98 % --      Rating of Perceived Exertion (Exercise) 8 13      Perceived Dyspnea (Exercise) 0 --      Symptoms L leg sore none      Comments -- third full day of exercise.       Duration -- Continue with 30 min of aerobic exercise without signs/symptoms of physical distress.      Intensity -- THRR unchanged        Progression   Progression -- Continue to progress workloads to maintain intensity without signs/symptoms of physical distress.      Average METs -- 2.25        Resistance Training   Training Prescription -- Yes      Weight -- 3 lb      Reps -- 10-15        Interval Training   Interval Training -- No        Treadmill   MPH -- 1.7      Grade -- 0      Minutes -- 15      METs -- 2.3        NuStep   Level -- 1      Minutes -- 15      METs -- 2.5        REL-XR   Level -- 1      Minutes -- 15      METs -- 2.2        Biostep-RELP   Level -- 1      Minutes -- 15      METs -- 2             Exercise Comments:   Exercise Goals and Review:  Exercise Goals    Row Name 04/19/20 1510             Exercise Goals   Increase Physical Activity Yes       Intervention Provide advice, education, support and counseling about physical activity/exercise needs.;Develop an individualized exercise prescription for aerobic and resistive training based on initial evaluation findings, risk stratification, comorbidities and participant's personal goals.       Expected Outcomes Short Term: Attend rehab on a regular basis to increase amount of physical activity.;Long Term: Add in home exercise to make exercise part  of routine and to increase amount of physical activity.;Long Term: Exercising regularly at least 3-5 days a week.       Increase Strength and Stamina Yes       Intervention Provide advice, education, support and counseling about physical activity/exercise needs.;Develop an individualized exercise prescription for aerobic and resistive training based on initial evaluation findings, risk stratification, comorbidities and participant's personal goals.       Expected Outcomes Short Term: Increase workloads from initial exercise prescription for resistance, speed, and METs.;Short Term: Perform resistance training exercises routinely during rehab and add in resistance training at home;Long Term: Improve cardiorespiratory fitness, muscular endurance and strength as measured by increased METs and functional capacity (6MWT)  Able to understand and use rate of perceived exertion (RPE) scale Yes       Intervention Provide education and explanation on how to use RPE scale       Expected Outcomes Short Term: Able to use RPE daily in rehab to express subjective intensity level;Long Term:  Able to use RPE to guide intensity level when exercising independently       Knowledge and understanding of Target Heart Rate Range (THRR) Yes       Intervention Provide education and explanation of THRR including how the numbers were predicted and where they are located for reference       Expected Outcomes Short Term: Able to state/look up THRR;Short Term: Able to use daily as guideline for intensity in rehab;Long Term: Able to use THRR to govern intensity when exercising independently       Able to check pulse independently Yes       Intervention Provide education and demonstration on how to check pulse in carotid and radial arteries.;Review the importance of being able to check your own pulse for safety during independent exercise       Expected Outcomes Short Term: Able to explain why pulse checking is important during  independent exercise;Long Term: Able to check pulse independently and accurately       Understanding of Exercise Prescription Yes       Intervention Provide education, explanation, and written materials on patient's individual exercise prescription       Expected Outcomes Short Term: Able to explain program exercise prescription;Long Term: Able to explain home exercise prescription to exercise independently              Exercise Goals Re-Evaluation :  Exercise Goals Re-Evaluation    Row Name 04/27/20 1508 05/05/20 1302           Exercise Goal Re-Evaluation   Exercise Goals Review Increase Physical Activity;Able to understand and use rate of perceived exertion (RPE) scale;Knowledge and understanding of Target Heart Rate Range (THRR);Understanding of Exercise Prescription;Increase Strength and Stamina;Able to check pulse independently Increase Physical Activity;Increase Strength and Stamina;Understanding of Exercise Prescription      Comments Reviewed RPE and dyspnea scales, THR and program prescription with pt today.  Pt voiced understanding and was given a copy of goals to take home. Ambers is off to a good start in rehab.  He is already up over 2 METs overall even with only 3 full day sessions. We will continue to montior his progress.      Expected Outcomes Short: Use RPE daily to regulate intensity. Long: Follow program prescription in THR. Short: Continue to attend regularly Long; Continue to follow program prescription.             Discharge Exercise Prescription (Final Exercise Prescription Changes):  Exercise Prescription Changes - 05/05/20 1300      Response to Exercise   Blood Pressure (Admit) 132/70    Blood Pressure (Exercise) 158/80    Blood Pressure (Exit) 124/64    Heart Rate (Admit) 90 bpm    Heart Rate (Exercise) 123 bpm    Heart Rate (Exit) 102 bpm    Rating of Perceived Exertion (Exercise) 13    Symptoms none    Comments third full day of exercise.      Duration Continue with 30 min of aerobic exercise without signs/symptoms of physical distress.    Intensity THRR unchanged      Progression   Progression Continue to progress workloads to maintain intensity  without signs/symptoms of physical distress.    Average METs 2.25      Resistance Training   Training Prescription Yes    Weight 3 lb    Reps 10-15      Interval Training   Interval Training No      Treadmill   MPH 1.7    Grade 0    Minutes 15    METs 2.3      NuStep   Level 1    Minutes 15    METs 2.5      REL-XR   Level 1    Minutes 15    METs 2.2      Biostep-RELP   Level 1    Minutes 15    METs 2           Nutrition:  Target Goals: Understanding of nutrition guidelines, daily intake of sodium <1517m, cholesterol <2034m calories 30% from fat and 7% or less from saturated fats, daily to have 5 or more servings of fruits and vegetables.  Education: Controlling Sodium/Reading Food Labels -Group verbal and written material supporting the discussion of sodium use in heart healthy nutrition. Review and explanation with models, verbal and written materials for utilization of the food label.   Education: General Nutrition Guidelines/Fats and Fiber: -Group instruction provided by verbal, written material, models and posters to present the general guidelines for heart healthy nutrition. Gives an explanation and review of dietary fats and fiber.   Biometrics:  Pre Biometrics - 04/19/20 1514      Pre Biometrics   Height 5' 9.5" (1.765 m)    Weight 168 lb 12.8 oz (76.6 kg)    BMI (Calculated) 24.58    Single Leg Stand 22.41 seconds            Nutrition Therapy Plan and Nutrition Goals:  Nutrition Therapy & Goals - 05/02/20 1606      Nutrition Therapy   Diet Low Na, heart healthy    Protein (specify units) 60-65g    Fiber 30 grams    Whole Grain Foods 3 servings    Saturated Fats 12 max. grams    Fruits and Vegetables 5 servings/day    Sodium 1.5  grams      Personal Nutrition Goals   Nutrition Goal LT: increase strength, get back to activities    Comments cheerios (loves frosted flakes) with a banana or sometimes eggs and bacon with toast and jam and some orange juice. L: sandwich (chicken slalad or ham and [place]- whole wheat. Discussed heart healthy eating.      Intervention Plan   Intervention Prescribe, educate and counsel regarding individualized specific dietary modifications aiming towards targeted core components such as weight, hypertension, lipid management, diabetes, heart failure and other comorbidities.;Nutrition handout(s) given to patient.    Expected Outcomes Short Term Goal: Understand basic principles of dietary content, such as calories, fat, sodium, cholesterol and nutrients.;Short Term Goal: A plan has been developed with personal nutrition goals set during dietitian appointment.;Long Term Goal: Adherence to prescribed nutrition plan.           Nutrition Assessments:  Nutrition Assessments - 04/19/20 1517      MEDFICTS Scores   Pre Score 19           MEDIFICTS Score Key:          ?70 Need to make dietary changes          40-70 Heart Healthy Diet         ?  40 Therapeutic Level Cholesterol Diet  Nutrition Goals Re-Evaluation:   Nutrition Goals Discharge (Final Nutrition Goals Re-Evaluation):   Psychosocial: Target Goals: Acknowledge presence or absence of significant depression and/or stress, maximize coping skills, provide positive support system. Participant is able to verbalize types and ability to use techniques and skills needed for reducing stress and depression.   Education: Depression - Provides group verbal and written instruction on the correlation between heart/lung disease and depressed mood, treatment options, and the stigmas associated with seeking treatment.   Education: Sleep Hygiene -Provides group verbal and written instruction about how sleep can affect your health.  Define  sleep hygiene, discuss sleep cycles and impact of sleep habits. Review good sleep hygiene tips.     Education: Stress and Anxiety: - Provides group verbal and written instruction about the health risks of elevated stress and causes of high stress.  Discuss the correlation between heart/lung disease and anxiety and treatment options. Review healthy ways to manage with stress and anxiety.    Initial Review & Psychosocial Screening:  Initial Psych Review & Screening - 04/15/20 1305      Initial Review   Current issues with Current Stress Concerns      Family Dynamics   Good Support System? Yes      Barriers   Psychosocial barriers to participate in program There are no identifiable barriers or psychosocial needs.;The patient should benefit from training in stress management and relaxation.      Screening Interventions   Interventions Encouraged to exercise;To provide support and resources with identified psychosocial needs;Provide feedback about the scores to participant    Expected Outcomes Short Term goal: Utilizing psychosocial counselor, staff and physician to assist with identification of specific Stressors or current issues interfering with healing process. Setting desired goal for each stressor or current issue identified.;Long Term Goal: Stressors or current issues are controlled or eliminated.;Short Term goal: Identification and review with participant of any Quality of Life or Depression concerns found by scoring the questionnaire.;Long Term goal: The participant improves quality of Life and PHQ9 Scores as seen by post scores and/or verbalization of changes           Quality of Life Scores:   Quality of Life - 04/19/20 1515      Quality of Life   Select Quality of Life      Quality of Life Scores   Health/Function Pre 18.8 %    Socioeconomic Pre 29 %    Psych/Spiritual Pre 28.29 %    Family Pre 18 %    GLOBAL Pre 22.25 %          Scores of 19 and below usually  indicate a poorer quality of life in these areas.  A difference of  2-3 points is a clinically meaningful difference.  A difference of 2-3 points in the total score of the Quality of Life Index has been associated with significant improvement in overall quality of life, self-image, physical symptoms, and general health in studies assessing change in quality of life.  PHQ-9: Recent Review Flowsheet Data    Depression screen [facility] 2/9 04/19/2020   Decreased Interest 0   Down, Depressed, Hopeless 1   PHQ - 2 Score 1   Altered sleeping 1   Tired, decreased energy 1   Change in appetite 1   Feeling bad or failure about yourself  1   Trouble concentrating 0   Moving slowly or fidgety/restless 0   Suicidal thoughts 0   PHQ-9 Score  5   Difficult doing work/chores Somewhat difficult     Interpretation of Total Score  Total Score Depression Severity:  1-4 = Minimal depression, 5-9 = Mild depression, 10-14 = Moderate depression, 15-19 = Moderately severe depression, 20-27 = Severe depression   Psychosocial Evaluation and Intervention:  Psychosocial Evaluation - 04/15/20 1317      Psychosocial Evaluation & Interventions   Comments Ronalee Belts reports doing well, although this heart surgery came as a surprise to him. He said the symptoms hit him suddenly. He states he is feeling much better, but wants to get back to his "everyday stuff" (i.e. mowing, taking care of his properties). His wife and he are excited about him starting Cardiac Rehab and gaining a better understanding of heart healthy living.    Expected Outcomes Short: attend Cardiac Rehab for education and exercise. Long: maintain positive self care habits.    Continue Psychosocial Services  Follow up required by staff           Psychosocial Re-Evaluation:   Psychosocial Discharge (Final Psychosocial Re-Evaluation):   Vocational Rehabilitation: Provide vocational rehab assistance to qualifying candidates.   Vocational Rehab Evaluation  & Intervention:  Vocational Rehab - 04/15/20 1305      Initial Vocational Rehab Evaluation & Intervention   Assessment shows need for Vocational Rehabilitation No           Education: Education Goals: Education classes will be provided on a variety of topics geared toward better understanding of heart health and risk factor modification. Participant will state understanding/return demonstration of topics presented as noted by education test scores.  Learning Barriers/Preferences:  Learning Barriers/Preferences - 04/15/20 1305      Learning Barriers/Preferences   Learning Barriers None    Learning Preferences None           General Cardiac Education Topics:  AED/CPR: - Group verbal and written instruction with the use of models to demonstrate the basic use of the AED with the basic ABC's of resuscitation.   Anatomy & Physiology of the Heart: - Group verbal and written instruction and models provide basic cardiac anatomy and physiology, with the coronary electrical and arterial systems. Review of Valvular disease and Heart Failure   Cardiac Procedures: - Group verbal and written instruction to review commonly prescribed medications for heart disease. Reviews the medication, class of the drug, and side effects. Includes the steps to properly store meds and maintain the prescription regimen. (beta blockers and nitrates)   Cardiac Medications I: - Group verbal and written instruction to review commonly prescribed medications for heart disease. Reviews the medication, class of the drug, and side effects. Includes the steps to properly store meds and maintain the prescription regimen.   Cardiac Medications II: -Group verbal and written instruction to review commonly prescribed medications for heart disease. Reviews the medication, class of the drug, and side effects. (all other drug classes)    Go Sex-Intimacy & Heart Disease, Get SMART - Goal Setting: - Group verbal and  written instruction through game format to discuss heart disease and the return to sexual intimacy. Provides group verbal and written material to discuss and apply goal setting through the application of the S.M.A.R.T. Method.   Other Matters of the Heart: - Provides group verbal, written materials and models to describe Stable Angina and Peripheral Artery. Includes description of the disease process and treatment options available to the cardiac patient.   Infection Prevention: - Provides verbal and written material to individual with discussion of infection control  including proper hand washing and proper equipment cleaning during exercise session.   Cardiac Rehab from 04/19/2020 in [facility] Cardiac and Pulmonary Rehab  Date 04/19/20  Educator AS  Instruction Review Code 1- Verbalizes Understanding      Falls Prevention: - Provides verbal and written material to individual with discussion of falls prevention and safety.   Cardiac Rehab from 04/19/2020 in [facility] Cardiac and Pulmonary Rehab  Date 04/19/20  Educator AS  Instruction Review Code 1- Verbalizes Understanding      Other: -Provides group and verbal instruction on various topics (see comments)   Knowledge Questionnaire Score:  Knowledge Questionnaire Score - 04/19/20 1514      Knowledge Questionnaire Score   Pre Score 21/26  angina/nutrition /exercise           Core Components/Risk Factors/Patient Goals at Admission:  Personal Goals and Risk Factors at Admission - 04/19/20 1519      Core Components/Risk Factors/Patient Goals on Admission    Weight Management Yes;Weight Maintenance    Intervention Weight Management: Develop a combined nutrition and exercise program designed to reach desired caloric intake, while maintaining appropriate intake of nutrient and fiber, sodium and fats, and appropriate energy expenditure required for the weight goal.;Weight Management: Provide education and appropriate resources to help  participant work on and attain dietary goals.    Admit Weight 168 lb 12.8 oz (76.6 kg)    Goal Weight: Short Term 165 lb (74.8 kg)    Goal Weight: Long Term 165 lb (74.8 kg)    Expected Outcomes Short Term: Continue to assess and modify interventions until short term weight is achieved;Long Term: Adherence to nutrition and physical activity/exercise program aimed toward attainment of established weight goal;Weight Maintenance: Understanding of the daily nutrition guidelines, which includes 25-35% calories from fat, 7% or less cal from saturated fats, less than 211m cholesterol, less than 1.5gm of sodium, & 5 or more servings of fruits and vegetables daily    Intervention Provide education on lifestyle modifcations including regular physical activity/exercise, weight management, moderate sodium restriction and increased consumption of fresh fruit, vegetables, and low fat dairy, alcohol moderation, and smoking cessation.;Monitor prescription use compliance.    Expected Outcomes Short Term: Continued assessment and intervention until BP is < 140/957mHG in hypertensive participants. < 130/8071mG in hypertensive participants with diabetes, heart failure or chronic kidney disease.;Long Term: Maintenance of blood pressure at goal levels.    Intervention Provide education and support for participant on nutrition & aerobic/resistive exercise along with prescribed medications to achieve LDL <57m79mDL >40mg9m Expected Outcomes Short Term: Participant states understanding of desired cholesterol values and is compliant with medications prescribed. Participant is following exercise prescription and nutrition guidelines.;Long Term: Cholesterol controlled with medications as prescribed, with individualized exercise RX and with personalized nutrition plan. Value goals: LDL < 57mg,56m > 40 mg.           Education:Diabetes - Individual verbal and written instruction to review signs/symptoms of diabetes, desired  ranges of glucose level fasting, after meals and with exercise. Acknowledge that pre and post exercise glucose checks will be done for 3 sessions at entry of program.   Education: Know Your Numbers and Risk Factors: -Group verbal and written instruction about important numbers in your health.  Discussion of what are risk factors and how they play a role in the disease process.  Review of Cholesterol, Blood Pressure, Diabetes, and BMI and the role they play in your overall health.   Core  Components/Risk Factors/Patient Goals Review:    Core Components/Risk Factors/Patient Goals at Discharge (Final Review):    ITP Comments:  ITP Comments    Row Name 04/15/20 1302 04/19/20 1518 04/27/20 1507 05/11/20 0552     ITP Comments Initial telephone orientation completed. Diagnosis can be found in [facility] 4/10. EP orientation scheduled for 3/25 at 1pm Completed 6MWT and gym orientation.  Initial ITP created and sent for review to Dr. Emily Filbert, Medical Director. First full day of exercise!  Patient was oriented to gym and equipment including functions, settings, policies, and procedures.  Patient's individual exercise prescription and treatment plan were reviewed.  All starting workloads were established based on the results of the 6 minute walk test done at initial orientation visit.  The plan for exercise progression was also introduced and progression will be customized based on patient's performance and goals. 30 Day review completed. Medical Director ITP review done, changes made as directed, and signed approval by Medical Director.           Comments: 30 Day review completed. Medical Director ITP review done, changes made as directed, and signed approval by Medical Director.

## 2020-05-11 NOTE — Progress Notes (Signed)
Daily Session Note  Patient Details  Name: Andre FACIANE Sr. MRN: [id number] Date of Birth: 1941/10/11 Referring Provider:     Cardiac Rehab from 04/19/2020 in [facility] Cardiac and Pulmonary Rehab  Referring Provider [name]      Encounter Date: 05/11/2020  Check In:  Session Check In - 05/11/20 1514      Check-In   Supervising physician immediately available to respond to emergencies See telemetry face sheet for immediately available ER MD    Location [facility]-Cardiac & Pulmonary Rehab    Staff Present Renita Papa, RN BSN;Melissa Caiola RDN, Rowe Pavy, BA, ACSM CEP, Exercise Physiologist    Virtual Visit No    Medication changes reported     No    Fall or balance concerns reported    No    Warm-up and Cool-down Performed on first and last piece of equipment    Resistance Training Performed Yes    VAD Patient? No    PAD/SET Patient? No      Pain Assessment   Currently in Pain? No/denies              Social History   Tobacco Use  Smoking Status Never Smoker  Smokeless Tobacco Never Used    Goals Met:  Independence with exercise equipment Exercise tolerated well No report of cardiac concerns or symptoms Strength training completed today  Goals Unmet:  Not Applicable  Comments: Pt able to follow exercise prescription today without complaint.  Will continue to monitor for progression.    Dr. Emily Filbert is Medical Director for [place] and [facility].

## 2020-05-12 ENCOUNTER — Encounter: Payer: Medicare Other | Admitting: *Deleted

## 2020-05-12 ENCOUNTER — Other Ambulatory Visit: Payer: Self-pay

## 2020-05-12 DIAGNOSIS — Z951 Presence of aortocoronary bypass graft: Secondary | ICD-10-CM

## 2020-05-12 NOTE — Progress Notes (Signed)
Daily Session Note  Patient Details  Name: Andre CLAUSS Sr. MRN: [id number] Date of Birth: December 03, 1940 Referring Provider:     Cardiac Rehab from 04/19/2020 in [facility] Cardiac and Pulmonary Rehab  Referring Provider [name]      Encounter Date: 05/12/2020  Check In:  Session Check In - 05/12/20 1458      Check-In   Supervising physician immediately available to respond to emergencies See telemetry face sheet for immediately available ER MD    Location [facility]-Cardiac & Pulmonary Rehab    Staff Present Renita Papa, RN BSN;Jessica Luan Pulling, MA, RCEP, CCRP, CCET;Joseph [place] RCP,RRT,BSRT    Virtual Visit No    Medication changes reported     No    Fall or balance concerns reported    No    Warm-up and Cool-down Performed on first and last piece of equipment    Resistance Training Performed Yes    VAD Patient? No    PAD/SET Patient? No      Pain Assessment   Currently in Pain? No/denies              Social History   Tobacco Use  Smoking Status Never Smoker  Smokeless Tobacco Never Used    Goals Met:  Independence with exercise equipment Exercise tolerated well No report of cardiac concerns or symptoms Strength training completed today  Goals Unmet:  Not Applicable  Comments: Pt able to follow exercise prescription today without complaint.  Will continue to monitor for progression.    Dr. Emily Filbert is Medical Director for [place] and [facility].

## 2020-05-16 ENCOUNTER — Other Ambulatory Visit: Payer: Self-pay

## 2020-05-16 ENCOUNTER — Encounter: Payer: Medicare Other | Admitting: *Deleted

## 2020-05-16 DIAGNOSIS — Z951 Presence of aortocoronary bypass graft: Secondary | ICD-10-CM | POA: Diagnosis not present

## 2020-05-16 NOTE — Progress Notes (Signed)
Daily Session Note  Patient Details  Name: Andre Murphy. MRN: [id number] Date of Birth: January 11, 1941 Referring Provider:     Cardiac Rehab from 04/19/2020 in [facility] Cardiac and Pulmonary Rehab  Referring Provider [name]      Encounter Date: 05/16/2020  Check In:  Session Check In - 05/16/20 1509      Check-In   Supervising physician immediately available to respond to emergencies See telemetry face sheet for immediately available ER MD    Location [facility]-Cardiac & Pulmonary Rehab    Staff Present Justin Mend RCP,RRT,BSRT;Rita Vialpando Sherryll Burger, RN Moises Blood, BS, ACSM CEP, Exercise Physiologist;Amanda Oletta Darter, [place], ACSM CEP, Exercise Physiologist    Virtual Visit No    Medication changes reported     No    Fall or balance concerns reported    No    Warm-up and Cool-down Performed on first and last piece of equipment    Resistance Training Performed Yes    VAD Patient? No    PAD/SET Patient? No      Pain Assessment   Currently in Pain? No/denies              Social History   Tobacco Use  Smoking Status Never Smoker  Smokeless Tobacco Never Used    Goals Met:  Independence with exercise equipment Exercise tolerated well No report of cardiac concerns or symptoms Strength training completed today  Goals Unmet:  Not Applicable  Comments: Pt able to follow exercise prescription today without complaint.  Will continue to monitor for progression.    Dr. Emily Filbert is Medical Director for [place] and [facility].

## 2020-05-19 ENCOUNTER — Other Ambulatory Visit: Payer: Self-pay

## 2020-05-19 ENCOUNTER — Encounter: Payer: Medicare Other | Admitting: *Deleted

## 2020-05-19 DIAGNOSIS — Z951 Presence of aortocoronary bypass graft: Secondary | ICD-10-CM | POA: Diagnosis not present

## 2020-05-19 NOTE — Progress Notes (Signed)
Daily Session Note  Patient Details  Name: Andre HART Sr. MRN: [id number] Date of Birth: March 04, 1941 Referring Provider:     Cardiac Rehab from 04/19/2020 in [facility] Cardiac and Pulmonary Rehab  Referring Provider [name]      Encounter Date: 05/19/2020  Check In:  Session Check In - 05/19/20 1501      Check-In   Supervising physician immediately available to respond to emergencies See telemetry face sheet for immediately available ER MD    Location [facility]-Cardiac & Pulmonary Rehab    Staff Present Renita Papa, RN BSN;Joseph [address] [place], [place], [place], CCRP, CCET    Virtual Visit No    Medication changes reported     No    Fall or balance concerns reported    No    Warm-up and Cool-down Performed on first and last piece of equipment    Resistance Training Performed Yes    VAD Patient? No    PAD/SET Patient? No      Pain Assessment   Currently in Pain? No/denies              Social History   Tobacco Use  Smoking Status Never Smoker  Smokeless Tobacco Never Used    Goals Met:  Independence with exercise equipment Exercise tolerated well No report of cardiac concerns or symptoms Strength training completed today  Goals Unmet:  Not Applicable  Comments: Pt able to follow exercise prescription today without complaint.  Will continue to monitor for progression.    Dr. Emily Filbert is Medical Director for [place] and [facility].

## 2020-05-23 ENCOUNTER — Encounter: Payer: Medicare Other | Admitting: *Deleted

## 2020-05-23 ENCOUNTER — Other Ambulatory Visit: Payer: Self-pay

## 2020-05-23 DIAGNOSIS — Z951 Presence of aortocoronary bypass graft: Secondary | ICD-10-CM | POA: Diagnosis not present

## 2020-05-23 NOTE — Progress Notes (Signed)
Daily Session Note  Patient Details  Name: Andre Murphy. MRN: [id number] Date of Birth: [birth year] Referring Provider:     Cardiac Rehab from 04/19/2020 in [facility] Cardiac and Pulmonary Rehab  Referring Provider [name]      Encounter Date: 05/23/2020  Check In:  Session Check In - 05/23/20 1510      Check-In   Supervising physician immediately available to respond to emergencies See telemetry face sheet for immediately available ER MD    Location [facility]-Cardiac & Pulmonary Rehab    Staff Present Renita Papa, RN BSN;Joseph [address] [place], [place], [place], CCRP, [place], [place], ACSM CEP, Exercise Physiologist    Virtual Visit No    Medication changes reported     No    Fall or balance concerns reported    No    Warm-up and Cool-down Performed on first and last piece of equipment    Resistance Training Performed Yes    VAD Patient? No    PAD/SET Patient? No      Pain Assessment   Currently in Pain? No/denies              Social History   Tobacco Use  Smoking Status Never Smoker  Smokeless Tobacco Never Used    Goals Met:  Independence with exercise equipment Exercise tolerated well No report of cardiac concerns or symptoms Strength training completed today  Goals Unmet:  Not Applicable  Comments: Pt able to follow exercise prescription today without complaint.  Will continue to monitor for progression.    Dr. Emily Filbert is Medical Director for [place] and [facility].

## 2020-05-25 ENCOUNTER — Encounter: Payer: Medicare Other | Admitting: *Deleted

## 2020-05-25 ENCOUNTER — Other Ambulatory Visit: Payer: Self-pay

## 2020-05-25 DIAGNOSIS — Z951 Presence of aortocoronary bypass graft: Secondary | ICD-10-CM

## 2020-05-25 NOTE — Progress Notes (Signed)
Daily Session Note  Patient Details  Name: Andre Murphy. MRN: [id number] Date of Birth: 1941/04/17 Referring Provider:     Cardiac Rehab from 04/19/2020 in [facility] Cardiac and Pulmonary Rehab  Referring Provider [name]      Encounter Date: 05/25/2020  Check In:  Session Check In - 05/25/20 1500      Check-In   Supervising physician immediately available to respond to emergencies See telemetry face sheet for immediately available ER MD    Location [facility]-Cardiac & Pulmonary Rehab    Staff Present Renita Papa, RN BSN;Jessica [place], MA, RCEP, CCRP, CCET;Amanda Sommer, [place], ACSM CEP, Exercise Physiologist    Virtual Visit No    Medication changes reported     No    Fall or balance concerns reported    No    Warm-up and Cool-down Performed on first and last piece of equipment    Resistance Training Performed Yes    VAD Patient? No    PAD/SET Patient? No      Pain Assessment   Currently in Pain? No/denies              Social History   Tobacco Use  Smoking Status Never Smoker  Smokeless Tobacco Never Used    Goals Met:  Independence with exercise equipment Exercise tolerated well No report of cardiac concerns or symptoms Strength training completed today  Goals Unmet:  Not Applicable  Comments: Pt able to follow exercise prescription today without complaint.  Will continue to monitor for progression.    Dr. Emily Filbert is Medical Director for [place] and [facility].

## 2020-05-26 ENCOUNTER — Encounter: Payer: Medicare Other | Attending: Internal Medicine | Admitting: *Deleted

## 2020-05-26 ENCOUNTER — Other Ambulatory Visit: Payer: Self-pay

## 2020-05-26 DIAGNOSIS — I10 Essential (primary) hypertension: Secondary | ICD-10-CM | POA: Diagnosis not present

## 2020-05-26 DIAGNOSIS — Z7982 Long term (current) use of aspirin: Secondary | ICD-10-CM | POA: Diagnosis not present

## 2020-05-26 DIAGNOSIS — Z79899 Other long term (current) drug therapy: Secondary | ICD-10-CM | POA: Diagnosis not present

## 2020-05-26 DIAGNOSIS — Z951 Presence of aortocoronary bypass graft: Secondary | ICD-10-CM | POA: Diagnosis present

## 2020-05-26 NOTE — Progress Notes (Signed)
Daily Session Note  Patient Details  Name: Andre HAYWOOD Sr. MRN: [id number] Date of Birth: Jul 07, 1941 Referring Provider:     Cardiac Rehab from 04/19/2020 in [facility] Cardiac and Pulmonary Rehab  Referring Provider [name]      Encounter Date: 05/26/2020  Check In:  Session Check In - 05/26/20 1511      Check-In   Supervising physician immediately available to respond to emergencies See telemetry face sheet for immediately available ER MD    Location [facility]-Cardiac & Pulmonary Rehab    Staff Present Renita Papa, RN BSN;Joseph [address] [place], [place], [place], CCRP, CCET    Virtual Visit No    Medication changes reported     No    Fall or balance concerns reported    No    Tobacco Cessation No Change    Warm-up and Cool-down Performed on first and last piece of equipment    Resistance Training Performed Yes    VAD Patient? No    PAD/SET Patient? No      Pain Assessment   Currently in Pain? No/denies              Social History   Tobacco Use  Smoking Status Never Smoker  Smokeless Tobacco Never Used    Goals Met:  Independence with exercise equipment Exercise tolerated well No report of cardiac concerns or symptoms Strength training completed today  Goals Unmet:  Not Applicable  Comments: Pt able to follow exercise prescription today without complaint.  Will continue to monitor for progression.    Dr. Emily Filbert is Medical Director for [place] and [facility].

## 2020-06-01 ENCOUNTER — Encounter: Payer: Medicare Other | Admitting: *Deleted

## 2020-06-01 ENCOUNTER — Other Ambulatory Visit: Payer: Self-pay

## 2020-06-01 DIAGNOSIS — Z951 Presence of aortocoronary bypass graft: Secondary | ICD-10-CM | POA: Diagnosis not present

## 2020-06-01 NOTE — Progress Notes (Signed)
Daily Session Note  Patient Details  Name: Andre Murphy Sr. MRN: [id number] Date of Birth: Apr 10, 1941 Referring Provider:     Cardiac Rehab from 04/19/2020 in [facility] Cardiac and Pulmonary Rehab  Referring Provider [name]      Encounter Date: 06/01/2020  Check In:  Session Check In - 06/01/20 1540      Check-In   Supervising physician immediately available to respond to emergencies See telemetry face sheet for immediately available ER MD    Location [facility]-Cardiac & Pulmonary Rehab    Staff Present Justin Mend RCP,RRT,BSRT;Draylon Mercadel Sherryll Burger, RN Margurite Auerbach, MS Exercise Physiologist    Virtual Visit No    Medication changes reported     No    Fall or balance concerns reported    No    Warm-up and Cool-down Performed on first and last piece of equipment    Resistance Training Performed Yes    VAD Patient? No    PAD/SET Patient? No      Pain Assessment   Currently in Pain? No/denies              Social History   Tobacco Use  Smoking Status Never Smoker  Smokeless Tobacco Never Used    Goals Met:  Independence with exercise equipment Exercise tolerated well No report of cardiac concerns or symptoms Strength training completed today  Goals Unmet:  Not Applicable  Comments: Pt able to follow exercise prescription today without complaint.  Will continue to monitor for progression.    Dr. Emily Filbert is Medical Director for [place] and [facility].

## 2020-06-06 ENCOUNTER — Encounter: Payer: Medicare Other | Admitting: *Deleted

## 2020-06-06 ENCOUNTER — Other Ambulatory Visit: Payer: Self-pay

## 2020-06-06 DIAGNOSIS — Z951 Presence of aortocoronary bypass graft: Secondary | ICD-10-CM

## 2020-06-06 NOTE — Progress Notes (Signed)
Daily Session Note  Patient Details  Name: Andre L Renz Sr. MRN: [id number] Date of Birth: 02/08/1941 Referring Provider:     Cardiac Rehab from 04/19/2020 in [facility] Cardiac and Pulmonary Rehab  Referring Provider [place]      Encounter Date: 06/06/2020  Check In:  Session Check In - 06/06/20 1544      Check-In   Supervising physician immediately available to respond to emergencies See telemetry face sheet for immediately available ER MD    Location [facility]-Cardiac & Pulmonary Rehab    Staff Present Meredith Craven, RN BSN;Kara Langdon, MS Exercise Physiologist;Kelly Hayes, BS, ACSM CEP, Exercise Physiologist    Virtual Visit No    Medication changes reported     No    Fall or balance concerns reported    No    Warm-up and Cool-down Performed on first and last piece of equipment    Resistance Training Performed Yes    VAD Patient? No    PAD/SET Patient? No      Pain Assessment   Currently in Pain? No/denies              Social History   Tobacco Use  Smoking Status Never Smoker  Smokeless Tobacco Never Used    Goals Met:  Independence with exercise equipment Exercise tolerated well No report of cardiac concerns or symptoms Strength training completed today  Goals Unmet:  Not Applicable  Comments: Pt able to follow exercise prescription today without complaint.  Will continue to monitor for progression.    Dr. Mark Miller is Medical Director for [facility] and [facility]. 

## 2020-06-08 ENCOUNTER — Other Ambulatory Visit: Payer: Self-pay

## 2020-06-08 ENCOUNTER — Encounter: Payer: Self-pay | Admitting: *Deleted

## 2020-06-08 DIAGNOSIS — Z951 Presence of aortocoronary bypass graft: Secondary | ICD-10-CM

## 2020-06-08 NOTE — Progress Notes (Signed)
Cardiac Individual Treatment Plan  Patient Details  Name: Andre COLDEN Sr. MRN: [id number] Date of Birth: 1941-03-14 Referring Provider:     Cardiac Rehab from 04/19/2020 in [facility]  Referring Provider [name]      Initial Encounter Date:    Cardiac Rehab from 04/19/2020 in [facility] Cardiac and Pulmonary Rehab  Date 04/19/20      Visit Diagnosis: S/P CABG x 3  Patient's Home Medications on Admission:  Current Outpatient Medications:  .  Acetaminophen 325 MG CAPS, Take by mouth., Disp: , Rfl:  .  amiodarone (PACERONE) 200 MG tablet, Take 400 mg by mouth three times daily through 4/29.  On, 4/30 reduce to 25m by mouth daily, Disp: , Rfl:  .  aspirin EC 81 MG tablet, Take 81 mg by mouth daily., Disp: , Rfl:  .  atorvastatin (LIPITOR) 40 MG tablet, Take by mouth., Disp: , Rfl:  .  carboxymethylcellulose (REFRESH PLUS) 0.5 % SOLN, Apply to eye., Disp: , Rfl:  .  furosemide (LASIX) 40 MG tablet, Take 1 tablet (40 mg total) by mouth daily., Disp: 30 tablet, Rfl: 0 .  isosorbide mononitrate (IMDUR) 30 MG 24 hr tablet, Take 1 tablet (30 mg total) by mouth daily. (Patient not taking: Reported on 04/15/2020), Disp: 30 tablet, Rfl: 0 .  lisinopril (ZESTRIL) 5 MG tablet, Take 1 tablet (5 mg total) by mouth daily., Disp: 30 tablet, Rfl: 0 .  metoprolol tartrate (LOPRESSOR) 50 MG tablet, Take 1 tablet (50 mg total) by mouth 2 (two) times daily., Disp: 60 tablet, Rfl: 0 .  spironolactone (ALDACTONE) 25 MG tablet, Take 0.5 tablets (12.5 mg total) by mouth daily. (Patient not taking: Reported on 04/15/2020), Disp: 15 tablet, Rfl: 0 .  temazepam (RESTORIL) 15 MG capsule, Take 15 mg by mouth at bedtime as needed., Disp: , Rfl:  .  vitamin B-12 (CYANOCOBALAMIN) 250 MCG tablet, Take 250 mcg by mouth as needed., Disp: , Rfl:   Past Medical History: Past Medical History:  Diagnosis Date  . Bursitis   . Elevated lipids   . Hypertension   . SOB (shortness of breath)     Tobacco  Use: Social History   Tobacco Use  Smoking Status Never Smoker  Smokeless Tobacco Never Used    Labs: Recent Review [employer], physiological   Labs for ITP Cardiac and Pulmonary Rehab Latest Ref Rng & Units 03/06/2020   Cholestrol 0 - 200 mg/dL 144   LDLCALC 0 - 99 mg/dL 86   HDL >40 mg/dL 46   Trlycerides <150 mg/dL 62   Hemoglobin A1c 4.8 - 5.6 % 5.4       Exercise Target Goals: Exercise Program Goal: Individual exercise prescription set using results from initial 6 min walk test and THRR while considering  patient's activity barriers and safety.   Exercise Prescription Goal: Initial exercise prescription builds to 30-45 minutes a day of aerobic activity, 2-3 days per week.  Home exercise guidelines will be given to patient during program as part of exercise prescription that the participant will acknowledge.   Education: Aerobic Exercise & Resistance Training: - Gives group verbal and written instruction on the various components of exercise. Focuses on aerobic and resistive training programs and the benefits of this training and how to safely progress through these programs..   Education: Exercise & Equipment Safety: - Individual verbal instruction and demonstration of equipment use and safety with use of the equipment.   [facility]  Date 04/19/20  Educator AS  Instruction Review Code 1- Verbalizes Understanding      Education: Exercise Physiology & General Exercise Guidelines: - Group verbal and written instruction with models to review the exercise physiology of the cardiovascular system and associated critical values. Provides general exercise guidelines with specific guidelines to those with heart or lung disease.    Education: Flexibility, Balance, Mind/Body Relaxation: Provides group verbal/written instruction on the benefits of flexibility and balance training, including mind/body exercise modes such as yoga, pilates  and tai chi.  Demonstration and skill practice provided.   Activity Barriers & Risk Stratification:  Activity Barriers & Cardiac Risk Stratification - 04/15/20 1312      Activity Barriers & Cardiac Risk Stratification   Activity Barriers Back Problems   messed up lower lumbar region in high school   Cardiac Risk Stratification High           6 Minute Walk:  6 Minute Walk    Row Name 04/19/20 1457         6 Minute Walk   Phase Initial     Distance 890 feet     Walk Time 6 minutes     # of Rest Breaks 0     MPH 1.7     METS 1.89     RPE 8     Perceived Dyspnea  0     VO2 Peak 6.6     Symptoms Yes (comment)     Comments L leg sore where they took vein out     Resting HR 75 bpm     Resting BP 102/52     Resting Oxygen Saturation  98 %     Exercise Oxygen Saturation  during 6 min walk 98 %     Max Ex. HR 95 bpm     Max Ex. BP 126/60     2 Minute Post BP 106/58            Oxygen Initial Assessment:   Oxygen Re-Evaluation:   Oxygen Discharge (Final Oxygen Re-Evaluation):   Initial Exercise Prescription:  Initial Exercise Prescription - 04/19/20 1500      Date of Initial Exercise RX and Referring Provider   Date 04/19/20    Referring Provider [name]      Treadmill   MPH 1.7    Grade 0    Minutes 15    METs 2.3      NuStep   Level 1    SPM 80    Minutes 15    METs 2      REL-XR   Level 1    Speed 50    Minutes 15    METs 2      Prescription Details   Frequency (times per week) 3    Duration Progress to 30 minutes of continuous aerobic without signs/symptoms of physical distress      Intensity   THRR 40-80% of Max Heartrate 102-128    Ratings of Perceived Exertion 11-13    Perceived Dyspnea 0-4      Resistance Training   Training Prescription Yes    Weight 3 lb    Reps 10-15           Perform Capillary Blood Glucose checks as needed.  Exercise Prescription Changes:  Exercise Prescription Changes    Row Name 04/19/20 1500  05/05/20 1300 05/16/20 1400 06/01/20 1400       Response to Exercise   Blood Pressure (  Admit) 102/52 132/70 108/60 134/66    Blood Pressure (Exercise) 126/60 158/80 146/70 140/80    Blood Pressure (Exit) 106/58 124/64 110/60 126/72    Heart Rate (Admit) 75 bpm 90 bpm 72 bpm 97 bpm    Heart Rate (Exercise) 95 bpm 123 bpm 96 bpm 121 bpm    Heart Rate (Exit) 87 bpm 102 bpm 82 bpm 94 bpm    Oxygen Saturation (Admit) 98 % -- -- --    Oxygen Saturation (Exercise) 98 % -- -- --    Rating of Perceived Exertion (Exercise) '8 13 12 12    '$ Perceived Dyspnea (Exercise) 0 -- -- --    Symptoms L leg sore none none none    Comments -- third full day of exercise.  -- --    Duration -- Continue with 30 min of aerobic exercise without signs/symptoms of physical distress. Continue with 30 min of aerobic exercise without signs/symptoms of physical distress. Continue with 30 min of aerobic exercise without signs/symptoms of physical distress.    Intensity -- THRR unchanged THRR unchanged THRR unchanged      Progression   Progression -- Continue to progress workloads to maintain intensity without signs/symptoms of physical distress. Continue to progress workloads to maintain intensity without signs/symptoms of physical distress. Continue to progress workloads to maintain intensity without signs/symptoms of physical distress.    Average METs -- 2.25 2.5 3.5      Resistance Training   Training Prescription -- Yes Yes Yes    Weight -- 3 lb 3 lb 3 lb    Reps -- 10-15 10-15 10-15      Interval Training   Interval Training -- No No No      Treadmill   MPH -- 1.7 1.7 2.2    Grade -- 0 0 4.5    Minutes -- '15 15 15    '$ METs -- 2.3 2.3 4.05      Recumbant Bike   Level -- -- 1 --    Watts -- -- 16 --    Minutes -- -- 15 --    METs -- -- 2.39 --      NuStep   Level -- 1 -- --    Minutes -- 15 -- --    METs -- 2.5 -- --      REL-XR   Level -- 1 1 --    Minutes -- 15 15 --    METs -- 2.2 2.6 --       Biostep-RELP   Level -- 1 -- 3    SPM -- -- -- 50    Minutes -- 15 -- 15    METs -- 2 -- 3           Exercise Comments:   Exercise Goals and Review:  Exercise Goals    Row Name 04/19/20 1510             Exercise Goals   Increase Physical Activity Yes       Intervention Provide advice, education, support and counseling about physical activity/exercise needs.;Develop an individualized exercise prescription for aerobic and resistive training based on initial evaluation findings, risk stratification, comorbidities and participant's personal goals.       Expected Outcomes Short Term: Attend rehab on a regular basis to increase amount of physical activity.;Long Term: Add in home exercise to make exercise part of routine and to increase amount of physical activity.;Long Term: Exercising regularly at least 3-5 days a week.  Increase Strength and Stamina Yes       Intervention Provide advice, education, support and counseling about physical activity/exercise needs.;Develop an individualized exercise prescription for aerobic and resistive training based on initial evaluation findings, risk stratification, comorbidities and participant's personal goals.       Expected Outcomes Short Term: Increase workloads from initial exercise prescription for resistance, speed, and METs.;Short Term: Perform resistance training exercises routinely during rehab and add in resistance training at home;Long Term: Improve cardiorespiratory fitness, muscular endurance and strength as measured by increased METs and functional capacity (6MWT)       Able to understand and use rate of perceived exertion (RPE) scale Yes       Intervention Provide education and explanation on how to use RPE scale       Expected Outcomes Short Term: Able to use RPE daily in rehab to express subjective intensity level;Long Term:  Able to use RPE to guide intensity level when exercising independently       Knowledge and understanding of  Target Heart Rate Range (THRR) Yes       Intervention Provide education and explanation of THRR including how the numbers were predicted and where they are located for reference       Expected Outcomes Short Term: Able to state/look up THRR;Short Term: Able to use daily as guideline for intensity in rehab;Long Term: Able to use THRR to govern intensity when exercising independently       Able to check pulse independently Yes       Intervention Provide education and demonstration on how to check pulse in carotid and radial arteries.;Review the importance of being able to check your own pulse for safety during independent exercise       Expected Outcomes Short Term: Able to explain why pulse checking is important during independent exercise;Long Term: Able to check pulse independently and accurately       Understanding of Exercise Prescription Yes       Intervention Provide education, explanation, and written materials on patient's individual exercise prescription       Expected Outcomes Short Term: Able to explain program exercise prescription;Long Term: Able to explain home exercise prescription to exercise independently              Exercise Goals Re-Evaluation :  Exercise Goals Re-Evaluation    Row Name 04/27/20 1508 05/05/20 1302 05/16/20 1445 05/23/20 1511 06/01/20 1443     Exercise Goal Re-Evaluation   Exercise Goals Review Increase Physical Activity;Able to understand and use rate of perceived exertion (RPE) scale;Knowledge and understanding of Target Heart Rate Range (THRR);Understanding of Exercise Prescription;Increase Strength and Stamina;Able to check pulse independently Increase Physical Activity;Increase Strength and Stamina;Understanding of Exercise Prescription Increase Physical Activity;Increase Strength and Stamina;Understanding of Exercise Prescription Increase Physical Activity;Increase Strength and Stamina;Understanding of Exercise Prescription Increase Physical  Activity;Increase Strength and Stamina;Understanding of Exercise Prescription   Comments Reviewed RPE and dyspnea scales, THR and program prescription with pt today.  Pt voiced understanding and was given a copy of goals to take home. Andre Murphy is off to a good start in rehab.  He is already up over 2 METs overall even with only 3 full day sessions. We will continue to montior his progress. Andre Murphy continues to do well in rehab.  He is up to 16 watts on the bike.  We will continue to monitor his progress. Andre Murphy walks at home since his driveway is over a quarter mile long. Informed him that needs to get his  hear rate up when he walks are home in between rehab days. Patient also needs to go over home exercise with EP. Andre Murphy has increased speed and grade on TM.  Staff will encourage him to increase to 4 lb for strength work.   Expected Outcomes Short: Use RPE daily to regulate intensity. Long: Follow program prescription in THR. Short: Continue to attend regularly Long; Continue to follow program prescription. Short: Add incline to treadmill Long: Continue to improve stamina. Short: add two days of walking at home. Go over home exercise with EP Long: maintain an exercise routine post [facility] independently. Short: increase weight for strength work Long:  build overall stamina          Discharge Exercise Prescription (Final Exercise Prescription Changes):  Exercise Prescription Changes - 06/01/20 1400      Response to Exercise   Blood Pressure (Admit) 134/66    Blood Pressure (Exercise) 140/80    Blood Pressure (Exit) 126/72    Heart Rate (Admit) 97 bpm    Heart Rate (Exercise) 121 bpm    Heart Rate (Exit) 94 bpm    Rating of Perceived Exertion (Exercise) 12    Symptoms none    Duration Continue with 30 min of aerobic exercise without signs/symptoms of physical distress.    Intensity THRR unchanged      Progression   Progression Continue to progress workloads to maintain intensity without  signs/symptoms of physical distress.    Average METs 3.5      Resistance Training   Training Prescription Yes    Weight 3 lb    Reps 10-15      Interval Training   Interval Training No      Treadmill   MPH 2.2    Grade 4.5    Minutes 15    METs 4.05      Biostep-RELP   Level 3    SPM 50    Minutes 15    METs 3           Nutrition:  Target Goals: Understanding of nutrition guidelines, daily intake of sodium '1500mg'$ , cholesterol '200mg'$ , calories 30% from fat and 7% or less from saturated fats, daily to have 5 or more servings of fruits and vegetables.  Education: Controlling Sodium/Reading Food Labels -Group verbal and written material supporting the discussion of sodium use in heart healthy nutrition. Review and explanation with models, verbal and written materials for utilization of the food label.   Education: General Nutrition Guidelines/Fats and Fiber: -Group instruction provided by verbal, written material, models and posters to present the general guidelines for heart healthy nutrition. Gives an explanation and review of dietary fats and fiber.   Biometrics:  Pre Biometrics - 04/19/20 1514      Pre Biometrics   Height 5' 9.5" (1.765 m)    Weight 168 lb 12.8 oz (76.6 kg)    BMI (Calculated) 24.58    Single Leg Stand 22.41 seconds            Nutrition Therapy Plan and Nutrition Goals:  Nutrition Therapy & Goals - 05/02/20 1606      Nutrition Therapy   Diet Low Na, heart healthy    Protein (specify units) 60-65g    Fiber 30 grams    Whole Grain Foods 3 servings    Saturated Fats 12 max. grams    Fruits and Vegetables 5 servings/day    Sodium 1.5 grams      Personal Nutrition Goals   Nutrition Goal LT:  increase strength, get back to activities    Comments cheerios (loves frosted flakes) with a banana or sometimes eggs and bacon with toast and jam and some orange juice. L: sandwich (chicken slalad or ham and [place]) - whole wheat. Discussed heart  healthy eating.      Intervention Plan   Intervention Prescribe, educate and counsel regarding individualized specific dietary modifications aiming towards targeted core components such as weight, hypertension, lipid management, diabetes, heart failure and other comorbidities.;Nutrition handout(s) given to patient.    Expected Outcomes Short Term Goal: Understand basic principles of dietary content, such as calories, fat, sodium, cholesterol and nutrients.;Short Term Goal: A plan has been developed with personal nutrition goals set during dietitian appointment.;Long Term Goal: Adherence to prescribed nutrition plan.           Nutrition Assessments:  Nutrition Assessments - 04/19/20 1517      MEDFICTS Scores   Pre Score 19           MEDIFICTS Score Key:          ?70 Need to make dietary changes          40-70 Heart Healthy Diet         ? 40 Therapeutic Level Cholesterol Diet  Nutrition Goals Re-Evaluation:   Nutrition Goals Discharge (Final Nutrition Goals Re-Evaluation):   Psychosocial: Target Goals: Acknowledge presence or absence of significant depression and/or stress, maximize coping skills, provide positive support system. Participant is able to verbalize types and ability to use techniques and skills needed for reducing stress and depression.   Education: Depression - Provides group verbal and written instruction on the correlation between heart/lung disease and depressed mood, treatment options, and the stigmas associated with seeking treatment.   Education: Sleep Hygiene -Provides group verbal and written instruction about how sleep can affect your health.  Define sleep hygiene, discuss sleep cycles and impact of sleep habits. Review good sleep hygiene tips.     Education: Stress and Anxiety: - Provides group verbal and written instruction about the health risks of elevated stress and causes of high stress.  Discuss the correlation between heart/lung disease and  anxiety and treatment options. Review healthy ways to manage with stress and anxiety.    Initial Review & Psychosocial Screening:  Initial Psych Review & Screening - 04/15/20 1305      Initial Review   Current issues with Current Stress Concerns      Family Dynamics   Good Support System? Yes      Barriers   Psychosocial barriers to participate in program There are no identifiable barriers or psychosocial needs.;The patient should benefit from training in stress management and relaxation.      Screening Interventions   Interventions Encouraged to exercise;To provide support and resources with identified psychosocial needs;Provide feedback about the scores to participant    Expected Outcomes Short Term goal: Utilizing psychosocial counselor, staff and physician to assist with identification of specific Stressors or current issues interfering with healing process. Setting desired goal for each stressor or current issue identified.;Long Term Goal: Stressors or current issues are controlled or eliminated.;Short Term goal: Identification and review with participant of any Quality of Life or Depression concerns found by scoring the questionnaire.;Long Term goal: The participant improves quality of Life and PHQ9 Scores as seen by post scores and/or verbalization of changes           Quality of Life Scores:   Quality of Life - 04/19/20 1515  Quality of Life   Select Quality of Life      Quality of Life Scores   Health/Function Pre 18.8 %    Socioeconomic Pre 29 %    Psych/Spiritual Pre 28.29 %    Family Pre 18 %    GLOBAL Pre 22.25 %          Scores of 19 and below usually indicate a poorer quality of life in these areas.  A difference of  2-3 points is a clinically meaningful difference.  A difference of 2-3 points in the total score of the Quality of Life Index has been associated with significant improvement in overall quality of life, self-image, physical symptoms, and general  health in studies assessing change in quality of life.  PHQ-9: Recent Review Flowsheet Data    Depression screen [facility] 2/9 05/23/2020 04/19/2020   Decreased Interest 2 0   Down, Depressed, Hopeless 2 1   PHQ - 2 Score 4 1   Altered sleeping 0 1   Tired, decreased energy 0 1   Change in appetite 0 1   Feeling bad or failure about yourself  1 1   Trouble concentrating 0 0   Moving slowly or fidgety/restless 0 0   Suicidal thoughts 0 0   PHQ-9 Score 5 5   Difficult doing work/chores Somewhat difficult Somewhat difficult     Interpretation of Total Score  Total Score Depression Severity:  1-4 = Minimal depression, 5-9 = Mild depression, 10-14 = Moderate depression, 15-19 = Moderately severe depression, 20-27 = Severe depression   Psychosocial Evaluation and Intervention:  Psychosocial Evaluation - 04/15/20 1317      Psychosocial Evaluation & Interventions   Comments Kathlene November reports doing well, although this heart surgery came as a surprise to him. He said the symptoms hit him suddenly. He states he is feeling much better, but wants to get back to his "everyday stuff" (i.e. mowing, taking care of his properties). His wife and he are excited about him starting Cardiac Rehab and gaining a better understanding of heart healthy living.    Expected Outcomes Short: attend Cardiac Rehab for education and exercise. Long: maintain positive self care habits.    Continue Psychosocial Services  Follow up required by staff           Psychosocial Re-Evaluation:  Psychosocial Re-Evaluation    Row Name 05/23/20 1522             Psychosocial Re-Evaluation   Current issues with History of Depression       Comments Reviewed patient health questionnaire (PHQ-9) with patient for follow up. Previously, patients score indicated signs/symptoms of depression.  Reviewed to see if patient is improving symptom wise while in program.  Score stayed the same and patient states that it is because the loss of his  dog last week.       Expected Outcomes Short: Continue to work toward an improvement in PHQ9 scores by attending [facility] regularly. Long: Continue to improve stress and depression coping skills by talking with staff and attending [facility] regularly and work toward a positive mental state.       Interventions Encouraged to attend Cardiac Rehabilitation for the exercise       Continue Psychosocial Services  Follow up required by staff              Psychosocial Discharge (Final Psychosocial Re-Evaluation):  Psychosocial Re-Evaluation - 05/23/20 1522      Psychosocial Re-Evaluation   Current issues with History  of Depression    Comments Reviewed patient health questionnaire (PHQ-9) with patient for follow up. Previously, patients score indicated signs/symptoms of depression.  Reviewed to see if patient is improving symptom wise while in program.  Score stayed the same and patient states that it is because the loss of his dog last week.    Expected Outcomes Short: Continue to work toward an improvement in [place] scores by attending [facility] regularly. Long: Continue to improve stress and depression coping skills by talking with staff and attending [facility] regularly and work toward a positive mental state.    Interventions Encouraged to attend Cardiac Rehabilitation for the exercise    Continue Psychosocial Services  Follow up required by staff           Vocational Rehabilitation: Provide vocational rehab assistance to qualifying candidates.   Vocational Rehab Evaluation & Intervention:  Vocational Rehab - 04/15/20 1305      Initial Vocational Rehab Evaluation & Intervention   Assessment shows need for Vocational Rehabilitation No           Education: Education Goals: Education classes will be provided on a variety of topics geared toward better understanding of heart health and risk factor modification. Participant will state understanding/return demonstration of topics  presented as noted by education test scores.  Learning Barriers/Preferences:  Learning Barriers/Preferences - 04/15/20 1305      Learning Barriers/Preferences   Learning Barriers None    Learning Preferences None           General Cardiac Education Topics:  AED/CPR: - Group verbal and written instruction with the use of models to demonstrate the basic use of the AED with the basic ABC's of resuscitation.   Anatomy & Physiology of the Heart: - Group verbal and written instruction and models provide basic cardiac anatomy and physiology, with the coronary electrical and arterial systems. Review of Valvular disease and Heart Failure   Cardiac Procedures: - Group verbal and written instruction to review commonly prescribed medications for heart disease. Reviews the medication, class of the drug, and side effects. Includes the steps to properly store meds and maintain the prescription regimen. (beta blockers and nitrates)   Cardiac Medications I: - Group verbal and written instruction to review commonly prescribed medications for heart disease. Reviews the medication, class of the drug, and side effects. Includes the steps to properly store meds and maintain the prescription regimen.   Cardiac Medications II: -Group verbal and written instruction to review commonly prescribed medications for heart disease. Reviews the medication, class of the drug, and side effects. (all other drug classes)    Go Sex-Intimacy & Heart Disease, Get SMART - Goal Setting: - Group verbal and written instruction through game format to discuss heart disease and the return to sexual intimacy. Provides group verbal and written material to discuss and apply goal setting through the application of the S.M.A.R.T. Method.   Other Matters of the Heart: - Provides group verbal, written materials and models to describe Stable Angina and Peripheral Artery. Includes description of the disease process and treatment  options available to the cardiac patient.   Infection Prevention: - Provides verbal and written material to individual with discussion of infection control including proper hand washing and proper equipment cleaning during exercise session.   Cardiac Rehab from 04/19/2020 in [facility] Cardiac and Pulmonary Rehab  Date 04/19/20  Educator AS  Instruction Review Code 1- Verbalizes Understanding      Falls Prevention: - Provides verbal and written material to individual  with discussion of falls prevention and safety.   Cardiac Rehab from 04/19/2020 in [facility] Cardiac and Pulmonary Rehab  Date 04/19/20  Educator AS  Instruction Review Code 1- Verbalizes Understanding      Other: -Provides group and verbal instruction on various topics (see comments)   Knowledge Questionnaire Score:  Knowledge Questionnaire Score - 04/19/20 1514      Knowledge Questionnaire Score   Pre Score 21/26  angina/nutrition /exercise           Core Components/Risk Factors/Patient Goals at Admission:  Personal Goals and Risk Factors at Admission - 04/19/20 1519      Core Components/Risk Factors/Patient Goals on Admission    Weight Management Yes;Weight Maintenance    Intervention Weight Management: Develop a combined nutrition and exercise program designed to reach desired caloric intake, while maintaining appropriate intake of nutrient and fiber, sodium and fats, and appropriate energy expenditure required for the weight goal.;Weight Management: Provide education and appropriate resources to help participant work on and attain dietary goals.    Admit Weight 168 lb 12.8 oz (76.6 kg)    Goal Weight: Short Term 165 lb (74.8 kg)    Goal Weight: Long Term 165 lb (74.8 kg)    Expected Outcomes Short Term: Continue to assess and modify interventions until short term weight is achieved;Long Term: Adherence to nutrition and physical activity/exercise program aimed toward attainment of established weight goal;Weight  Maintenance: Understanding of the daily nutrition guidelines, which includes 25-35% calories from fat, 7% or less cal from saturated fats, less than '200mg'$  cholesterol, less than 1.5gm of sodium, & 5 or more servings of fruits and vegetables daily    Intervention Provide education on lifestyle modifcations including regular physical activity/exercise, weight management, moderate sodium restriction and increased consumption of fresh fruit, vegetables, and low fat dairy, alcohol moderation, and smoking cessation.;Monitor prescription use compliance.    Expected Outcomes Short Term: Continued assessment and intervention until BP is < 140/75m HG in hypertensive participants. < 130/831mHG in hypertensive participants with diabetes, heart failure or chronic kidney disease.;Long Term: Maintenance of blood pressure at goal levels.    Intervention Provide education and support for participant on nutrition & aerobic/resistive exercise along with prescribed medications to achieve LDL '70mg'$ , HDL >'40mg'$ .    Expected Outcomes Short Term: Participant states understanding of desired cholesterol values and is compliant with medications prescribed. Participant is following exercise prescription and nutrition guidelines.;Long Term: Cholesterol controlled with medications as prescribed, with individualized exercise RX and with personalized nutrition plan. Value goals: LDL < '70mg'$ , HDL > 40 mg.           Education:Diabetes - Individual verbal and written instruction to review signs/symptoms of diabetes, desired ranges of glucose level fasting, after meals and with exercise. Acknowledge that pre and post exercise glucose checks will be done for 3 sessions at entry of program.   Education: Know Your Numbers and Risk Factors: -Group verbal and written instruction about important numbers in your health.  Discussion of what are risk factors and how they play a role in the disease process.  Review of Cholesterol, Blood Pressure,  Diabetes, and BMI and the role they play in your overall health.   Core Components/Risk Factors/Patient Goals Review:   Goals and Risk Factor Review    Row Name 05/23/20 1525             Core Components/Risk Factors/Patient Goals Review   Personal Goals Review Weight Management/Obesity;Hypertension;Lipids       Review MiRonalee Beltsas  checking his blood pressure at home and has since stopped. Informed patient to bring in his home blood pressure cuff to check against. Patient is taking his blood pressure medications and lipids medications and has no questions regarding them.       Expected Outcomes Short: bring in blood pressure cuff. Long: Maintain blood pressures reading at home independently.              Core Components/Risk Factors/Patient Goals at Discharge (Final Review):   Goals and Risk Factor Review - 05/23/20 1525      Core Components/Risk Factors/Patient Goals Review   Personal Goals Review Weight Management/Obesity;Hypertension;Lipids    Review Andre Murphy was checking his blood pressure at home and has since stopped. Informed patient to bring in his home blood pressure cuff to check against. Patient is taking his blood pressure medications and lipids medications and has no questions regarding them.    Expected Outcomes Short: bring in blood pressure cuff. Long: Maintain blood pressures reading at home independently.           ITP Comments:  ITP Comments    Row Name 04/15/20 1302 04/19/20 1518 04/27/20 1507 05/11/20 0552 06/08/20 0645   ITP Comments Initial telephone orientation completed. Diagnosis can be found in [facility] 4/10. EP orientation scheduled for 3/25 at 1pm Completed 6MWT and gym orientation.  Initial ITP created and sent for review to Dr. Emily Filbert, Medical Director. First full day of exercise!  Patient was oriented to gym and equipment including functions, settings, policies, and procedures.  Patient's individual exercise prescription and treatment plan were reviewed.  All  starting workloads were established based on the results of the 6 minute walk test done at initial orientation visit.  The plan for exercise progression was also introduced and progression will be customized based on patient's performance and goals. 30 Day review completed. Medical Director ITP review done, changes made as directed, and signed approval by Medical Director. 30 Day review completed. Medical Director ITP review done, changes made as directed, and signed approval by Medical Director.          Comments:

## 2020-06-09 ENCOUNTER — Other Ambulatory Visit: Payer: Self-pay

## 2020-06-09 ENCOUNTER — Encounter: Payer: Medicare Other | Admitting: *Deleted

## 2020-06-09 DIAGNOSIS — Z951 Presence of aortocoronary bypass graft: Secondary | ICD-10-CM

## 2020-06-09 NOTE — Progress Notes (Signed)
Daily Session Note  Patient Details  Name: Andre MARAJ Sr. MRN: [id number] Date of Birth: 1940-12-12 Referring Provider:     Cardiac Rehab from 04/19/2020 in [facility] Cardiac and Pulmonary Rehab  Referring Provider Andre Murphy      Encounter Date: 06/09/2020  Check In:  Session Check In - 06/09/20 1534      Check-In   Supervising physician immediately available to respond to emergencies See telemetry face sheet for immediately available ER MD    Location [facility]-Cardiac & Pulmonary Rehab    Staff Present Renita Papa, RN BSN;Joseph Hood RCP,RRT,BSRT;Laureen Montura, [place], RRT, CPFT    Virtual Visit No    Medication changes reported     No    Fall or balance concerns reported    No    Tobacco Cessation No Change    Warm-up and Cool-down Performed on first and last piece of equipment    Resistance Training Performed Yes    VAD Patient? No    PAD/SET Patient? No      Pain Assessment   Currently in Pain? No/denies              Social History   Tobacco Use  Smoking Status Never Smoker  Smokeless Tobacco Never Used    Goals Met:  Independence with exercise equipment Exercise tolerated well No report of cardiac concerns or symptoms Strength training completed today  Goals Unmet:  Not Applicable  Comments: Pt able to follow exercise prescription today without complaint.  Will continue to monitor for progression.    Dr. Emily Filbert is Medical Director for [place] and [facility].

## 2020-06-13 ENCOUNTER — Other Ambulatory Visit: Payer: Self-pay

## 2020-06-13 ENCOUNTER — Encounter: Payer: Medicare Other | Admitting: *Deleted

## 2020-06-13 DIAGNOSIS — Z951 Presence of aortocoronary bypass graft: Secondary | ICD-10-CM | POA: Diagnosis not present

## 2020-06-13 NOTE — Progress Notes (Signed)
Daily Session Note  Patient Details  Name: Andre ALMQUIST Sr. MRN: [id number] Date of Birth: [birth year] Referring Provider:     Cardiac Rehab from 04/19/2020 in [facility] Cardiac and Pulmonary Rehab  Referring Provider [name]      Encounter Date: 06/13/2020  Check In:  Session Check In - 06/13/20 1531      Check-In   Supervising physician immediately available to respond to emergencies See telemetry face sheet for immediately available ER MD    Location [facility]-Cardiac & Pulmonary Rehab    Staff Present Renita Papa, RN Margurite Auerbach, MS Exercise Physiologist;Kelly Amedeo Plenty, BS, ACSM CEP, Exercise Physiologist    Virtual Visit No    Medication changes reported     No    Fall or balance concerns reported    No    Warm-up and Cool-down Performed on first and last piece of equipment    Resistance Training Performed Yes    VAD Patient? No    PAD/SET Patient? No      Pain Assessment   Currently in Pain? No/denies              Social History   Tobacco Use  Smoking Status Never Smoker  Smokeless Tobacco Never Used    Goals Met:  Independence with exercise equipment Exercise tolerated well No report of cardiac concerns or symptoms Strength training completed today  Goals Unmet:  Not Applicable  Comments: Pt able to follow exercise prescription today without complaint.  Will continue to monitor for progression.    Dr. Emily Filbert is Medical Director for [place] and [facility].

## 2020-06-15 ENCOUNTER — Other Ambulatory Visit: Payer: Self-pay

## 2020-06-15 ENCOUNTER — Encounter: Payer: Medicare Other | Admitting: *Deleted

## 2020-06-15 DIAGNOSIS — Z951 Presence of aortocoronary bypass graft: Secondary | ICD-10-CM | POA: Diagnosis not present

## 2020-06-15 NOTE — Progress Notes (Signed)
Daily Session Note  Patient Details  Name: Andre ALKIRE Sr. MRN: [id number] Date of Birth: [birth year] Referring Provider:     Cardiac Rehab from 04/19/2020 in [facility] Cardiac and Pulmonary Rehab  Referring Provider [name]      Encounter Date: 06/15/2020  Check In:  Session Check In - 06/15/20 1535      Check-In   Supervising physician immediately available to respond to emergencies See telemetry face sheet for immediately available ER MD    Location [facility]-Cardiac & Pulmonary Rehab    Staff Present Renita Papa, RN Margurite Auerbach, MS Exercise Physiologist;Amanda Oletta Darter, BA, ACSM CEP, Exercise Physiologist    Virtual Visit No    Medication changes reported     No    Fall or balance concerns reported    No    Warm-up and Cool-down Performed on first and last piece of equipment    Resistance Training Performed Yes    VAD Patient? No    PAD/SET Patient? No      Pain Assessment   Currently in Pain? No/denies              Social History   Tobacco Use  Smoking Status Never Smoker  Smokeless Tobacco Never Used    Goals Met:  Independence with exercise equipment Exercise tolerated well No report of cardiac concerns or symptoms Strength training completed today  Goals Unmet:  Not Applicable  Comments: Pt able to follow exercise prescription today without complaint.  Will continue to monitor for progression.    Dr. Emily Filbert is Medical Director for [place] and [facility].

## 2020-06-16 ENCOUNTER — Other Ambulatory Visit: Payer: Self-pay

## 2020-06-16 ENCOUNTER — Encounter: Payer: Medicare Other | Admitting: *Deleted

## 2020-06-16 DIAGNOSIS — Z951 Presence of aortocoronary bypass graft: Secondary | ICD-10-CM | POA: Diagnosis not present

## 2020-06-16 NOTE — Progress Notes (Signed)
Daily Session Note  Patient Details  Name: Andre SCHROETER Sr. MRN: [id number] Date of Birth: 1941-06-05 Referring Provider:     Cardiac Rehab from 04/19/2020 in [facility] Cardiac and Pulmonary Rehab  Referring Provider [name]      Encounter Date: 06/16/2020  Check In:  Session Check In - 06/16/20 1548      Check-In   Supervising physician immediately available to respond to emergencies See telemetry face sheet for immediately available ER MD    Location [facility]-Cardiac & Pulmonary Rehab    Staff Present Renita Papa, RN BSN;Joseph Hood RCP,RRT,BSRT;Amanda Garibaldi, [place], ACSM CEP, Exercise Physiologist    Virtual Visit No    Medication changes reported     No    Fall or balance concerns reported    No    Warm-up and Cool-down Performed on first and last piece of equipment    Resistance Training Performed Yes    VAD Patient? No    PAD/SET Patient? No      Pain Assessment   Currently in Pain? No/denies              Social History   Tobacco Use  Smoking Status Never Smoker  Smokeless Tobacco Never Used    Goals Met:  Independence with exercise equipment Exercise tolerated well No report of cardiac concerns or symptoms Strength training completed today  Goals Unmet:  Not Applicable  Comments: Pt able to follow exercise prescription today without complaint.  Will continue to monitor for progression.    Dr. Emily Filbert is Medical Director for [place] and [facility].

## 2020-06-27 ENCOUNTER — Encounter: Payer: Medicare Other | Attending: Internal Medicine

## 2020-06-27 DIAGNOSIS — Z951 Presence of aortocoronary bypass graft: Secondary | ICD-10-CM | POA: Insufficient documentation

## 2020-06-27 DIAGNOSIS — Z79899 Other long term (current) drug therapy: Secondary | ICD-10-CM | POA: Insufficient documentation

## 2020-06-27 DIAGNOSIS — I10 Essential (primary) hypertension: Secondary | ICD-10-CM | POA: Insufficient documentation

## 2020-06-27 DIAGNOSIS — Z7982 Long term (current) use of aspirin: Secondary | ICD-10-CM | POA: Insufficient documentation

## 2020-06-29 ENCOUNTER — Encounter: Payer: Medicare Other | Admitting: *Deleted

## 2020-06-29 ENCOUNTER — Other Ambulatory Visit: Payer: Self-pay

## 2020-06-29 DIAGNOSIS — Z7982 Long term (current) use of aspirin: Secondary | ICD-10-CM | POA: Diagnosis not present

## 2020-06-29 DIAGNOSIS — Z951 Presence of aortocoronary bypass graft: Secondary | ICD-10-CM | POA: Diagnosis present

## 2020-06-29 DIAGNOSIS — I10 Essential (primary) hypertension: Secondary | ICD-10-CM | POA: Diagnosis not present

## 2020-06-29 DIAGNOSIS — Z79899 Other long term (current) drug therapy: Secondary | ICD-10-CM | POA: Diagnosis not present

## 2020-06-29 NOTE — Progress Notes (Signed)
Daily Session Note  Patient Details  Name: ESAUL DORWART Sr. MRN: [id number] Date of Birth: 10-Apr-1941 Referring Provider:     Cardiac Rehab from 04/19/2020 in [facility] Cardiac and Pulmonary Rehab  Referring Provider [name]      Encounter Date: 06/29/2020  Check In:  Session Check In - 06/29/20 1535      Check-In   Supervising physician immediately available to respond to emergencies See telemetry face sheet for immediately available ER MD    Location [facility]-Cardiac & Pulmonary Rehab    Staff Present Justin Mend RCP,RRT,BSRT;Alahia Whicker Sherryll Burger, RN Margurite Auerbach, MS Exercise Physiologist;Amanda Oletta Darter, BA, ACSM CEP, Exercise Physiologist    Virtual Visit No    Medication changes reported     No    Fall or balance concerns reported    No    Warm-up and Cool-down Performed on first and last piece of equipment    Resistance Training Performed Yes    VAD Patient? No    PAD/SET Patient? No      Pain Assessment   Currently in Pain? No/denies              Social History   Tobacco Use  Smoking Status Never Smoker  Smokeless Tobacco Never Used    Goals Met:  Independence with exercise equipment Exercise tolerated well No report of cardiac concerns or symptoms Strength training completed today  Goals Unmet:  Not Applicable  Comments: Pt able to follow exercise prescription today without complaint.  Will continue to monitor for progression.    Dr. Emily Filbert is Medical Director for [place] and [facility].

## 2020-06-30 ENCOUNTER — Encounter: Payer: Medicare Other | Admitting: *Deleted

## 2020-06-30 ENCOUNTER — Other Ambulatory Visit: Payer: Self-pay

## 2020-06-30 DIAGNOSIS — Z951 Presence of aortocoronary bypass graft: Secondary | ICD-10-CM | POA: Diagnosis not present

## 2020-06-30 NOTE — Progress Notes (Signed)
Daily Session Note  Patient Details  Name: Andre SCHOWALTER Sr. MRN: [id number] Date of Birth: 18-Jan-1941 Referring Provider:     Cardiac Rehab from 04/19/2020 in [facility] Cardiac and Pulmonary Rehab  Referring Provider [name]      Encounter Date: 06/30/2020  Check In:  Session Check In - 06/30/20 1532      Check-In   Supervising physician immediately available to respond to emergencies See telemetry face sheet for immediately available ER MD    Location [facility]-Cardiac & Pulmonary Rehab    Staff Present Renita Papa, RN BSN;Joseph Hood RCP,RRT,BSRT;Laureen [place], [place], RRT, CPFT    Virtual Visit No    Medication changes reported     No    Fall or balance concerns reported    No    Warm-up and Cool-down Performed on first and last piece of equipment    Resistance Training Performed Yes    VAD Patient? No    PAD/SET Patient? No      Pain Assessment   Currently in Pain? No/denies              Social History   Tobacco Use  Smoking Status Never Smoker  Smokeless Tobacco Never Used    Goals Met:  Independence with exercise equipment Exercise tolerated well No report of cardiac concerns or symptoms Strength training completed today  Goals Unmet:  Not Applicable  Comments: Pt able to follow exercise prescription today without complaint.  Will continue to monitor for progression.    Dr. Emily Filbert is Medical Director for [place] and [facility].

## 2020-07-04 ENCOUNTER — Other Ambulatory Visit: Payer: Self-pay

## 2020-07-04 ENCOUNTER — Encounter: Payer: Medicare Other | Admitting: *Deleted

## 2020-07-04 DIAGNOSIS — Z951 Presence of aortocoronary bypass graft: Secondary | ICD-10-CM | POA: Diagnosis not present

## 2020-07-04 NOTE — Progress Notes (Signed)
Daily Session Note  Patient Details  Name: Andre MARINARO Sr. MRN: [id number] Date of Birth: Jun 28, 1941 Referring Provider:     Cardiac Rehab from 04/19/2020 in [facility] Cardiac and Pulmonary Rehab  Referring Provider [name]      Encounter Date: 07/04/2020  Check In:  Session Check In - 07/04/20 1527      Check-In   Supervising physician immediately available to respond to emergencies See telemetry face sheet for immediately available ER MD    Location [facility]-Cardiac & Pulmonary Rehab    Staff Present Renita Papa, RN Margurite Auerbach, MS Exercise Physiologist;Kelly Amedeo Plenty, BS, ACSM CEP, Exercise Physiologist    Virtual Visit No    Medication changes reported     No    Fall or balance concerns reported    No    Warm-up and Cool-down Performed on first and last piece of equipment    Resistance Training Performed Yes    VAD Patient? No    PAD/SET Patient? No      Pain Assessment   Currently in Pain? No/denies              Social History   Tobacco Use  Smoking Status Never Smoker  Smokeless Tobacco Never Used    Goals Met:  Independence with exercise equipment Exercise tolerated well No report of cardiac concerns or symptoms Strength training completed today  Goals Unmet:  Not Applicable  Comments: Pt able to follow exercise prescription today without complaint.  Will continue to monitor for progression.    Dr. Emily Filbert is Medical Director for [place] and [facility].

## 2020-07-06 ENCOUNTER — Encounter: Payer: Medicare Other | Admitting: *Deleted

## 2020-07-06 ENCOUNTER — Encounter: Payer: Self-pay | Admitting: *Deleted

## 2020-07-06 ENCOUNTER — Other Ambulatory Visit: Payer: Self-pay

## 2020-07-06 DIAGNOSIS — Z951 Presence of aortocoronary bypass graft: Secondary | ICD-10-CM

## 2020-07-06 NOTE — Progress Notes (Signed)
Daily Session Note  Patient Details  Name: Andre CORPUS Sr. MRN: [id number] Date of Birth: 17-Sep-1941 Referring Provider:     Cardiac Rehab from 04/19/2020 in [facility] Cardiac and Pulmonary Rehab  Referring Provider [name]      Encounter Date: 07/06/2020  Check In:  Session Check In - 07/06/20 1547      Check-In   Supervising physician immediately available to respond to emergencies See telemetry face sheet for immediately available ER MD    Location [facility]-Cardiac & Pulmonary Rehab    Staff Present Justin Mend RCP,RRT,BSRT;Maleigha Colvard Sherryll Burger, RN Margurite Auerbach, MS Exercise Physiologist    Virtual Visit No    Medication changes reported     No    Fall or balance concerns reported    No    Warm-up and Cool-down Performed on first and last piece of equipment    Resistance Training Performed Yes    VAD Patient? No    PAD/SET Patient? No      Pain Assessment   Currently in Pain? No/denies              Social History   Tobacco Use  Smoking Status Never Smoker  Smokeless Tobacco Never Used    Goals Met:  Independence with exercise equipment Exercise tolerated well No report of cardiac concerns or symptoms Strength training completed today  Goals Unmet:  Not Applicable  Comments: Pt able to follow exercise prescription today without complaint.  Will continue to monitor for progression.    Dr. Emily Filbert is Medical Director for [place] and [facility].

## 2020-07-06 NOTE — Progress Notes (Signed)
Cardiac Individual Treatment Plan  Patient Details  Name: Andre FRYE Sr. MRN: [id number] Date of Birth: Nov 19, 1941 Referring Provider:     Cardiac Rehab from 04/19/2020 in [facility] Cardiac and Pulmonary Rehab  Referring Provider [name]      Initial Encounter Date:    Cardiac Rehab from 04/19/2020 in [facility] Cardiac and Pulmonary Rehab  Date 04/19/20      Visit Diagnosis: S/P CABG x 3  Patient's Home Medications on Admission:  Current Outpatient Medications:  .  Acetaminophen 325 MG CAPS, Take by mouth., Disp: , Rfl:  .  amiodarone (PACERONE) 200 MG tablet, Take 400 mg by mouth three times daily through 4/29.  On, 4/30 reduce to 227m by mouth daily, Disp: , Rfl:  .  aspirin EC 81 MG tablet, Take 81 mg by mouth daily., Disp: , Rfl:  .  atorvastatin (LIPITOR) 40 MG tablet, Take by mouth., Disp: , Rfl:  .  carboxymethylcellulose (REFRESH PLUS) 0.5 % SOLN, Apply to eye., Disp: , Rfl:  .  furosemide (LASIX) 40 MG tablet, Take 1 tablet (40 mg total) by mouth daily., Disp: 30 tablet, Rfl: 0 .  isosorbide mononitrate (IMDUR) 30 MG 24 hr tablet, Take 1 tablet (30 mg total) by mouth daily. (Patient not taking: Reported on 04/15/2020), Disp: 30 tablet, Rfl: 0 .  lisinopril (ZESTRIL) 5 MG tablet, Take 1 tablet (5 mg total) by mouth daily., Disp: 30 tablet, Rfl: 0 .  metoprolol tartrate (LOPRESSOR) 50 MG tablet, Take 1 tablet (50 mg total) by mouth 2 (two) times daily., Disp: 60 tablet, Rfl: 0 .  spironolactone (ALDACTONE) 25 MG tablet, Take 0.5 tablets (12.5 mg total) by mouth daily. (Patient not taking: Reported on 04/15/2020), Disp: 15 tablet, Rfl: 0 .  temazepam (RESTORIL) 15 MG capsule, Take 15 mg by mouth at bedtime as needed., Disp: , Rfl:  .  vitamin B-12 (CYANOCOBALAMIN) 250 MCG tablet, Take 250 mcg by mouth as needed., Disp: , Rfl:   Past Medical History: Past Medical History:  Diagnosis Date  . Bursitis   . Elevated lipids   . Hypertension   . SOB (shortness of breath)     Tobacco  Use: Social History   Tobacco Use  Smoking Status Never Smoker  Smokeless Tobacco Never Used    Labs: Recent Review [employer], physiological   Labs for ITP Cardiac and Pulmonary Rehab Latest Ref Rng & Units 03/06/2020   Cholestrol 0 - 200 mg/dL 144   LDLCALC 0 - 99 mg/dL 86   HDL >40 mg/dL 46   Trlycerides <150 mg/dL 62   Hemoglobin A1c 4.8 - 5.6 % 5.4       Exercise Target Goals: Exercise Program Goal: Individual exercise prescription set using results from initial 6 min walk test and THRR while considering  patient's activity barriers and safety.   Exercise Prescription Goal: Initial exercise prescription builds to 30-45 minutes a day of aerobic activity, 2-3 days per week.  Home exercise guidelines will be given to patient during program as part of exercise prescription that the participant will acknowledge.   Education: Aerobic Exercise & Resistance Training: - Gives group verbal and written instruction on the various components of exercise. Focuses on aerobic and resistive training programs and the benefits of this training and how to safely progress through these programs..   Education: Exercise & Equipment Safety: - Individual verbal instruction and demonstration of equipment use and safety with use of the equipment.   [facility]  Date 04/19/20  Educator AS  Instruction Review Code 1- Verbalizes Understanding      Education: Exercise Physiology & General Exercise Guidelines: - Group verbal and written instruction with models to review the exercise physiology of the cardiovascular system and associated critical values. Provides general exercise guidelines with specific guidelines to those with heart or lung disease.    Education: Flexibility, Balance, Mind/Body Relaxation: Provides group verbal/written instruction on the benefits of flexibility and balance training, including mind/body exercise modes such as yoga, pilates  and tai chi.  Demonstration and skill practice provided.   Activity Barriers & Risk Stratification:  Activity Barriers & Cardiac Risk Stratification - 04/15/20 1312      Activity Barriers & Cardiac Risk Stratification   Activity Barriers Back Problems   messed up lower lumbar region in high school   Cardiac Risk Stratification High           6 Minute Walk:  6 Minute Walk    Row Name 04/19/20 1457         6 Minute Walk   Phase Initial     Distance 890 feet     Walk Time 6 minutes     # of Rest Breaks 0     MPH 1.7     METS 1.89     RPE 8     Perceived Dyspnea  0     VO2 Peak 6.6     Symptoms Yes (comment)     Comments L leg sore where they took vein out     Resting HR 75 bpm     Resting BP 102/52     Resting Oxygen Saturation  98 %     Exercise Oxygen Saturation  during 6 min walk 98 %     Max Ex. HR 95 bpm     Max Ex. BP 126/60     2 Minute Post BP 106/58            Oxygen Initial Assessment:   Oxygen Re-Evaluation:   Oxygen Discharge (Final Oxygen Re-Evaluation):   Initial Exercise Prescription:  Initial Exercise Prescription - 04/19/20 1500      Date of Initial Exercise RX and Referring Provider   Date 04/19/20    Referring Provider [name]      Treadmill   MPH 1.7    Grade 0    Minutes 15    METs 2.3      NuStep   Level 1    SPM 80    Minutes 15    METs 2      REL-XR   Level 1    Speed 50    Minutes 15    METs 2      Prescription Details   Frequency (times per week) 3    Duration Progress to 30 minutes of continuous aerobic without signs/symptoms of physical distress      Intensity   THRR 40-80% of Max Heartrate 102-128    Ratings of Perceived Exertion 11-13    Perceived Dyspnea 0-4      Resistance Training   Training Prescription Yes    Weight 3 lb    Reps 10-15           Perform Capillary Blood Glucose checks as needed.  Exercise Prescription Changes:  Exercise Prescription Changes    Row Name 04/19/20 1500  05/05/20 1300 05/16/20 1400 06/01/20 1400 06/13/20 1300     Response to Exercise   Blood Pressure (  Admit) 102/52 132/70 108/60 134/66 120/62   Blood Pressure (Exercise) 126/60 158/80 146/70 140/80 138/62   Blood Pressure (Exit) 106/58 124/64 110/60 126/72 122/60   Heart Rate (Admit) 75 bpm 90 bpm 72 bpm 97 bpm 64 bpm   Heart Rate (Exercise) 95 bpm 123 bpm 96 bpm 121 bpm 107 bpm   Heart Rate (Exit) 87 bpm 102 bpm 82 bpm 94 bpm 80 bpm   Oxygen Saturation (Admit) 98 % -- -- -- --   Oxygen Saturation (Exercise) 98 % -- -- -- --   Rating of Perceived Exertion (Exercise) 8 13 12 12 12    Perceived Dyspnea (Exercise) 0 -- -- -- --   Symptoms L leg sore none none none none   Comments -- third full day of exercise.  -- -- --   Duration -- Continue with 30 min of aerobic exercise without signs/symptoms of physical distress. Continue with 30 min of aerobic exercise without signs/symptoms of physical distress. Continue with 30 min of aerobic exercise without signs/symptoms of physical distress. Continue with 30 min of aerobic exercise without signs/symptoms of physical distress.   Intensity -- THRR unchanged THRR unchanged THRR unchanged THRR unchanged     Progression   Progression -- Continue to progress workloads to maintain intensity without signs/symptoms of physical distress. Continue to progress workloads to maintain intensity without signs/symptoms of physical distress. Continue to progress workloads to maintain intensity without signs/symptoms of physical distress. Continue to progress workloads to maintain intensity without signs/symptoms of physical distress.   Average METs -- 2.25 2.5 3.5 3.15     Resistance Training   Training Prescription -- Yes Yes Yes Yes   Weight -- 3 lb 3 lb 3 lb 3 lb   Reps -- 10-15 10-15 10-15 10-15     Interval Training   Interval Training -- No No No No     Treadmill   MPH -- 1.7 1.7 2.2 2.2   Grade -- 0 0 4.5 4.5   Minutes -- 15 15 15 15    METs -- 2.3 2.3  4.05 4.05     Recumbant Bike   Level -- -- 1 -- --   Watts -- -- 16 -- --   Minutes -- -- 15 -- --   METs -- -- 2.39 -- --     NuStep   Level -- 1 -- -- 2   Minutes -- 15 -- -- 15   METs -- 2.5 -- -- 2.4     REL-XR   Level -- 1 1 -- --   Minutes -- 15 15 -- --   METs -- 2.2 2.6 -- --     Biostep-RELP   Level -- 1 -- 3 3   SPM -- -- -- 50 --   Minutes -- 15 -- 15 15   METs -- 2 -- 3 3   Row Name 06/30/20 0900             Response to Exercise   Blood Pressure (Admit) 134/60       Blood Pressure (Exercise) 128/70       Blood Pressure (Exit) 130/70       Heart Rate (Admit) 75 bpm       Heart Rate (Exercise) 118 bpm       Heart Rate (Exit) 78 bpm       Rating of Perceived Exertion (Exercise) 12       Symptoms none       Duration Continue with 30 min  of aerobic exercise without signs/symptoms of physical distress.       Intensity THRR unchanged         Progression   Progression Continue to progress workloads to maintain intensity without signs/symptoms of physical distress.       Average METs 3.7         Resistance Training   Training Prescription Yes       Weight 3 lb       Reps 10-15         Interval Training   Interval Training No         Treadmill   MPH 2.2       Grade 4.5       Minutes 15       METs 4.05         Biostep-RELP   Level 3       SPM 50       Minutes 15       METs 3.3              Exercise Comments:   Exercise Goals and Review:  Exercise Goals    Row Name 04/19/20 1510             Exercise Goals   Increase Physical Activity Yes       Intervention Provide advice, education, support and counseling about physical activity/exercise needs.;Develop an individualized exercise prescription for aerobic and resistive training based on initial evaluation findings, risk stratification, comorbidities and participant's personal goals.       Expected Outcomes Short Term: Attend rehab on a regular basis to increase amount of physical  activity.;Long Term: Add in home exercise to make exercise part of routine and to increase amount of physical activity.;Long Term: Exercising regularly at least 3-5 days a week.       Increase Strength and Stamina Yes       Intervention Provide advice, education, support and counseling about physical activity/exercise needs.;Develop an individualized exercise prescription for aerobic and resistive training based on initial evaluation findings, risk stratification, comorbidities and participant's personal goals.       Expected Outcomes Short Term: Increase workloads from initial exercise prescription for resistance, speed, and METs.;Short Term: Perform resistance training exercises routinely during rehab and add in resistance training at home;Long Term: Improve cardiorespiratory fitness, muscular endurance and strength as measured by increased METs and functional capacity (6MWT)       Able to understand and use rate of perceived exertion (RPE) scale Yes       Intervention Provide education and explanation on how to use RPE scale       Expected Outcomes Short Term: Able to use RPE daily in rehab to express subjective intensity level;Long Term:  Able to use RPE to guide intensity level when exercising independently       Knowledge and understanding of Target Heart Rate Range (THRR) Yes       Intervention Provide education and explanation of THRR including how the numbers were predicted and where they are located for reference       Expected Outcomes Short Term: Able to state/look up THRR;Short Term: Able to use daily as guideline for intensity in rehab;Long Term: Able to use THRR to govern intensity when exercising independently       Able to check pulse independently Yes       Intervention Provide education and demonstration on how to check pulse in carotid and radial arteries.;Review the importance of being able to  check your own pulse for safety during independent exercise       Expected Outcomes Short  Term: Able to explain why pulse checking is important during independent exercise;Long Term: Able to check pulse independently and accurately       Understanding of Exercise Prescription Yes       Intervention Provide education, explanation, and written materials on patient's individual exercise prescription       Expected Outcomes Short Term: Able to explain program exercise prescription;Long Term: Able to explain home exercise prescription to exercise independently              Exercise Goals Re-Evaluation :  Exercise Goals Re-Evaluation    Row Name 04/27/20 1508 05/05/20 1302 05/16/20 1445 05/23/20 1511 06/01/20 1443     Exercise Goal Re-Evaluation   Exercise Goals Review Increase Physical Activity;Able to understand and use rate of perceived exertion (RPE) scale;Knowledge and understanding of Target Heart Rate Range (THRR);Understanding of Exercise Prescription;Increase Strength and Stamina;Able to check pulse independently Increase Physical Activity;Increase Strength and Stamina;Understanding of Exercise Prescription Increase Physical Activity;Increase Strength and Stamina;Understanding of Exercise Prescription Increase Physical Activity;Increase Strength and Stamina;Understanding of Exercise Prescription Increase Physical Activity;Increase Strength and Stamina;Understanding of Exercise Prescription   Comments Reviewed RPE and dyspnea scales, THR and program prescription with pt today.  Pt voiced understanding and was given a copy of goals to take home. Andre Murphy is off to a good start in rehab.  He is already up over 2 METs overall even with only 3 full day sessions. We will continue to montior his progress. Andre Murphy continues to do well in rehab.  He is up to 16 watts on the bike.  We will continue to monitor his progress. Andre Murphy walks at home since his driveway is over a quarter mile long. Informed him that needs to get his hear rate up when he walks are home in between rehab days. Patient also  needs to go over home exercise with EP. Dayvion has increased speed and grade on TM.  Staff will encourage him to increase to 4 lb for strength work.   Expected Outcomes Short: Use RPE daily to regulate intensity. Long: Follow program prescription in THR. Short: Continue to attend regularly Long; Continue to follow program prescription. Short: Add incline to treadmill Long: Continue to improve stamina. Short: add two days of walking at home. Go over home exercise with EP Long: maintain an exercise routine post [facility] independently. Short: increase weight for strength work Long:  build overall stamina   Row Name 06/13/20 1338 06/29/20 1616 06/30/20 0904         Exercise Goal Re-Evaluation   Exercise Goals Review Increase Physical Activity;Increase Strength and Stamina;Understanding of Exercise Prescription Increase Physical Activity;Increase Strength and Stamina Increase Physical Activity;Increase Strength and Stamina     Comments Finas continues to do well in rehab.  He is now up to 4.5% grade on the treadmill.  We will continue to encourage him to increase his weights and monitor his progress. Andre Murphy states that when he is done with rehab he wants to [facility] the [facility]. He has not been exercising at home since it has been to hot out. [facility] Informed him that he can walk outside when it is cool out. --     Expected Outcomes Short: Increase hand weights Long: Continue to improve stamina. Short: start to exercise at home again. Long: maintain and exercise routine at home [facility] --            Discharge Exercise Prescription (Final Exercise Prescription Changes):  Exercise Prescription Changes - 06/30/20 0900      Response to Exercise   Blood Pressure (Admit) 134/60    Blood Pressure (Exercise) 128/70    Blood Pressure (Exit) 130/70    Heart Rate (Admit) 75 bpm    Heart Rate (Exercise) 118 bpm    Heart Rate (Exit) 78 bpm     Rating of Perceived Exertion (Exercise) 12    Symptoms none    Duration Continue with 30 min of aerobic exercise without signs/symptoms of physical distress.    Intensity THRR unchanged      Progression   Progression Continue to progress workloads to maintain intensity without signs/symptoms of physical distress.    Average METs 3.7      Resistance Training   Training Prescription Yes    Weight 3 lb    Reps 10-15      Interval Training   Interval Training No      Treadmill   MPH 2.2    Grade 4.5    Minutes 15    METs 4.05      Biostep-RELP   Level 3    SPM 50    Minutes 15    METs 3.3           Nutrition:  Target Goals: Understanding of nutrition guidelines, daily intake of sodium <1542m, cholesterol <2048m calories 30% from fat and 7% or less from saturated fats, daily to have 5 or more servings of fruits and vegetables.  Education: Controlling Sodium/Reading Food Labels -Group verbal and written material supporting the discussion of sodium use in heart healthy nutrition. Review and explanation with models, verbal and written materials for utilization of the food label.   Education: General Nutrition Guidelines/Fats and Fiber: -Group instruction provided by verbal, written material, models and posters to present the general guidelines for heart healthy nutrition. Gives an explanation and review of dietary fats and fiber.   Biometrics:  Pre Biometrics - 04/19/20 1514      Pre Biometrics   Height 5' 9.5" (1.765 m)    Weight 168 lb 12.8 oz (76.6 kg)    BMI (Calculated) 24.58    Single Leg Stand 22.41 seconds            Nutrition Therapy Plan and Nutrition Goals:  Nutrition Therapy & Goals - 05/02/20 1606      Nutrition Therapy   Diet Low Na, heart healthy    Protein (specify units) 60-65g    Fiber 30 grams    Whole Grain Foods 3 servings    Saturated Fats 12 max. grams    Fruits and Vegetables 5 servings/day    Sodium 1.5 grams      Personal  Nutrition Goals   Nutrition Goal LT: increase strength, get back to activities    Comments cheerios (loves frosted flakes) with a banana or sometimes eggs and bacon with toast and jam and some orange juice. L: sandwich (chicken slalad or ham and [place]- whole wheat. Discussed heart healthy eating.      Intervention Plan   Intervention Prescribe, educate and counsel regarding individualized specific dietary modifications aiming towards targeted core components such as weight, hypertension, lipid management, diabetes, heart failure and other comorbidities.;Nutrition handout(s) given to patient.    Expected Outcomes Short Term Goal: Understand basic principles of dietary content, such as calories, fat, sodium, cholesterol  and nutrients.;Short Term Goal: A plan has been developed with personal nutrition goals set during dietitian appointment.;Long Term Goal: Adherence to prescribed nutrition plan.           Nutrition Assessments:  Nutrition Assessments - 04/19/20 1517      MEDFICTS Scores   Pre Score 19           MEDIFICTS Score Key:          ?70 Need to make dietary changes          40-70 Heart Healthy Diet         ? 40 Therapeutic Level Cholesterol Diet  Nutrition Goals Re-Evaluation:   Nutrition Goals Discharge (Final Nutrition Goals Re-Evaluation):   Psychosocial: Target Goals: Acknowledge presence or absence of significant depression and/or stress, maximize coping skills, provide positive support system. Participant is able to verbalize types and ability to use techniques and skills needed for reducing stress and depression.   Education: Depression - Provides group verbal and written instruction on the correlation between heart/lung disease and depressed mood, treatment options, and the stigmas associated with seeking treatment.   Cardiac Rehab from 06/29/2020 in [facility] Cardiac and Pulmonary Rehab  Date 06/29/20  Educator AS  Instruction Review Code 1- Verbalizes Understanding       Education: Sleep Hygiene -Provides group verbal and written instruction about how sleep can affect your health.  Define sleep hygiene, discuss sleep cycles and impact of sleep habits. Review good sleep hygiene tips.     Education: Stress and Anxiety: - Provides group verbal and written instruction about the health risks of elevated stress and causes of high stress.  Discuss the correlation between heart/lung disease and anxiety and treatment options. Review healthy ways to manage with stress and anxiety.   Cardiac Rehab from 06/29/2020 in [facility] Cardiac and Pulmonary Rehab  Date 06/29/20  Educator AS  Instruction Review Code 1- Verbalizes Understanding       Initial Review & Psychosocial Screening:  Initial Psych Review & Screening - 04/15/20 1305      Initial Review   Current issues with Current Stress Concerns      Family Dynamics   Good Support System? Yes      Barriers   Psychosocial barriers to participate in program There are no identifiable barriers or psychosocial needs.;The patient should benefit from training in stress management and relaxation.      Screening Interventions   Interventions Encouraged to exercise;To provide support and resources with identified psychosocial needs;Provide feedback about the scores to participant    Expected Outcomes Short Term goal: Utilizing psychosocial counselor, staff and physician to assist with identification of specific Stressors or current issues interfering with healing process. Setting desired goal for each stressor or current issue identified.;Long Term Goal: Stressors or current issues are controlled or eliminated.;Short Term goal: Identification and review with participant of any Quality of Life or Depression concerns found by scoring the questionnaire.;Long Term goal: The participant improves quality of Life and PHQ9 Scores as seen by post scores and/or verbalization of changes           Quality of Life Scores:   Quality  of Life - 04/19/20 1515      Quality of Life   Select Quality of Life      Quality of Life Scores   Health/Function Pre 18.8 %    Socioeconomic Pre 29 %    Psych/Spiritual Pre 28.29 %    Family Pre 18 %    GLOBAL  Pre 22.25 %          Scores of 19 and below usually indicate a poorer quality of life in these areas.  A difference of  2-3 points is a clinically meaningful difference.  A difference of 2-3 points in the total score of the Quality of Life Index has been associated with significant improvement in overall quality of life, self-image, physical symptoms, and general health in studies assessing change in quality of life.  PHQ-9: Recent Review Flowsheet Data    Depression screen [facility] 2/9 06/29/2020 05/23/2020 04/19/2020   Decreased Interest 0 2 0   Down, Depressed, Hopeless 0 2 1   PHQ - 2 Score 0 4 1   Altered sleeping 0 0 1   Tired, decreased energy 0 0 1   Change in appetite 0 0 1   Feeling bad or failure about yourself  1 1 1    Trouble concentrating 0 0 0   Moving slowly or fidgety/restless 0 0 0   Suicidal thoughts 0 0 0   PHQ-9 Score 1 5 5    Difficult doing work/chores Not difficult at all Somewhat difficult Somewhat difficult     Interpretation of Total Score  Total Score Depression Severity:  1-4 = Minimal depression, 5-9 = Mild depression, 10-14 = Moderate depression, 15-19 = Moderately severe depression, 20-27 = Severe depression   Psychosocial Evaluation and Intervention:  Psychosocial Evaluation - 04/15/20 1317      Psychosocial Evaluation & Interventions   Comments Andre Murphy reports doing well, although this heart surgery came as a surprise to him. He said the symptoms hit him suddenly. He states he is feeling much better, but wants to get back to his "everyday stuff" (i.e. mowing, taking care of his properties). His wife and he are excited about him starting Cardiac Rehab and gaining a better understanding of heart healthy living.    Expected Outcomes Short: attend  Cardiac Rehab for education and exercise. Long: maintain positive self care habits.    Continue Psychosocial Services  Follow up required by staff           Psychosocial Re-Evaluation:  Psychosocial Re-Evaluation    [place] [facility] Name 05/23/20 1522 06/29/20 1611           Psychosocial Re-Evaluation   Current issues with History of Depression None Identified      Comments Reviewed patient health questionnaire (PHQ-9) with patient for follow up. Previously, patients score indicated signs/symptoms of depression.  Reviewed to see if patient is improving symptom wise while in program.  Score stayed the same and patient states that it is because the loss of his dog last week. Reviewed patient health questionnaire (PHQ-9) with patient for follow up. Previously, patients score indicated signs/symptoms of depression.  Reviewed to see if patient is improving symptom wise while in program.  Score improved and patient states that it is because he has been able to come to rehab and exercise.      Expected Outcomes Short: Continue to work toward an improvement in [place] scores by attending [facility] regularly. Long: Continue to improve stress and depression coping skills by talking with staff and attending [facility] regularly and work toward a positive mental state. Short: Continue to attend [facility] regularly for regular exercise and social engagement. Long: Continue to improve symptoms and manage a positive mental state.      Interventions Encouraged to attend Cardiac Rehabilitation for the exercise Encouraged to attend Cardiac Rehabilitation for the exercise      Continue  Psychosocial Services  Follow up required by staff No Follow up required             Psychosocial Discharge (Final Psychosocial Re-Evaluation):  Psychosocial Re-Evaluation - 06/29/20 1611      Psychosocial Re-Evaluation   Current issues with None Identified    Comments Reviewed patient health questionnaire (PHQ-9) with patient  for follow up. Previously, patients score indicated signs/symptoms of depression.  Reviewed to see if patient is improving symptom wise while in program.  Score improved and patient states that it is because he has been able to come to rehab and exercise.    Expected Outcomes Short: Continue to attend [facility] regularly for regular exercise and social engagement. Long: Continue to improve symptoms and manage a positive mental state.    Interventions Encouraged to attend Cardiac Rehabilitation for the exercise    Continue Psychosocial Services  No Follow up required           Vocational Rehabilitation: Provide vocational rehab assistance to qualifying candidates.   Vocational Rehab Evaluation & Intervention:  Vocational Rehab - 04/15/20 1305      Initial Vocational Rehab Evaluation & Intervention   Assessment shows need for Vocational Rehabilitation No           Education: Education Goals: Education classes will be provided on a variety of topics geared toward better understanding of heart health and risk factor modification. Participant will state understanding/return demonstration of topics presented as noted by education test scores.  Learning Barriers/Preferences:  Learning Barriers/Preferences - 04/15/20 1305      Learning Barriers/Preferences   Learning Barriers None    Learning Preferences None           General Cardiac Education Topics:  AED/CPR: - Group verbal and written instruction with the use of models to demonstrate the basic use of the AED with the basic ABC's of resuscitation.   Anatomy & Physiology of the Heart: - Group verbal and written instruction and models provide basic cardiac anatomy and physiology, with the coronary electrical and arterial systems. Review of Valvular disease and Heart Failure   Cardiac Procedures: - Group verbal and written instruction to review commonly prescribed medications for heart disease. Reviews the  medication, class of the drug, and side effects. Includes the steps to properly store meds and maintain the prescription regimen. (beta blockers and nitrates)   Cardiac Medications I: - Group verbal and written instruction to review commonly prescribed medications for heart disease. Reviews the medication, class of the drug, and side effects. Includes the steps to properly store meds and maintain the prescription regimen.   Cardiac Medications II: -Group verbal and written instruction to review commonly prescribed medications for heart disease. Reviews the medication, class of the drug, and side effects. (all other drug classes)    Go Sex-Intimacy & Heart Disease, Get SMART - Goal Setting: - Group verbal and written instruction through game format to discuss heart disease and the return to sexual intimacy. Provides group verbal and written material to discuss and apply goal setting through the application of the S.M.A.R.T. Method.   Other Matters of the Heart: - Provides group verbal, written materials and models to describe Stable Angina and Peripheral Artery. Includes description of the disease process and treatment options available to the cardiac patient.   Infection Prevention: - Provides verbal and written material to individual with discussion of infection control including proper hand washing and proper equipment cleaning during exercise session.   Cardiac Rehab from 06/29/2020 in [facility]  Cardiac and Pulmonary Rehab  Date 04/19/20  Educator AS  Instruction Review Code 1- Verbalizes Understanding      Falls Prevention: - Provides verbal and written material to individual with discussion of falls prevention and safety.   Cardiac Rehab from 06/29/2020 in [facility] Cardiac and Pulmonary Rehab  Date 04/19/20  Educator AS  Instruction Review Code 1- Verbalizes Understanding      Other: -Provides group and verbal instruction on various topics (see comments)   Knowledge Questionnaire  Score:  Knowledge Questionnaire Score - 04/19/20 1514      Knowledge Questionnaire Score   Pre Score 21/26  angina/nutrition /exercise           Core Components/Risk Factors/Patient Goals at Admission:  Personal Goals and Risk Factors at Admission - 04/19/20 1519      Core Components/Risk Factors/Patient Goals on Admission    Weight Management Yes;Weight Maintenance    Intervention Weight Management: Develop a combined nutrition and exercise program designed to reach desired caloric intake, while maintaining appropriate intake of nutrient and fiber, sodium and fats, and appropriate energy expenditure required for the weight goal.;Weight Management: Provide education and appropriate resources to help participant work on and attain dietary goals.    Admit Weight 168 lb 12.8 oz (76.6 kg)    Goal Weight: Short Term 165 lb (74.8 kg)    Goal Weight: Long Term 165 lb (74.8 kg)    Expected Outcomes Short Term: Continue to assess and modify interventions until short term weight is achieved;Long Term: Adherence to nutrition and physical activity/exercise program aimed toward attainment of established weight goal;Weight Maintenance: Understanding of the daily nutrition guidelines, which includes 25-35% calories from fat, 7% or less cal from saturated fats, less than 230m cholesterol, less than 1.5gm of sodium, & 5 or more servings of fruits and vegetables daily    Intervention Provide education on lifestyle modifcations including regular physical activity/exercise, weight management, moderate sodium restriction and increased consumption of fresh fruit, vegetables, and low fat dairy, alcohol moderation, and smoking cessation.;Monitor prescription use compliance.    Expected Outcomes Short Term: Continued assessment and intervention until BP is < 140/912mHG in hypertensive participants. < 130/8065mG in hypertensive participants with diabetes, heart failure or chronic kidney disease.;Long Term: Maintenance  of blood pressure at goal levels.    Intervention Provide education and support for participant on nutrition & aerobic/resistive exercise along with prescribed medications to achieve LDL <68m25mDL >40mg26m Expected Outcomes Short Term: Participant states understanding of desired cholesterol values and is compliant with medications prescribed. Participant is following exercise prescription and nutrition guidelines.;Long Term: Cholesterol controlled with medications as prescribed, with individualized exercise RX and with personalized nutrition plan. Value goals: LDL < 68mg,61m > 40 mg.           Education:Diabetes - Individual verbal and written instruction to review signs/symptoms of diabetes, desired ranges of glucose level fasting, after meals and with exercise. Acknowledge that pre and post exercise glucose checks will be done for 3 sessions at entry of program.   Education: Know Your Numbers and Risk Factors: -Group verbal and written instruction about important numbers in your health.  Discussion of what are risk factors and how they play a role in the disease process.  Review of Cholesterol, Blood Pressure, Diabetes, and BMI and the role they play in your overall health.   Core Components/Risk Factors/Patient Goals Review:   Goals and Risk Factor Review    Row Name 05/23/20 1525 06/29/20  1612           Core Components/Risk Factors/Patient Goals Review   Personal Goals Review Weight Management/Obesity;Hypertension;Lipids Weight Management/Obesity;Lipids;Hypertension      Review Andre Murphy was checking his blood pressure at home and has since stopped. Informed patient to bring in his home blood pressure cuff to check against. Patient is taking his blood pressure medications and lipids medications and has no questions regarding them. Andre Murphy states that he wants to maintain his weight currently. He ate a bit when he went to the beach but still managed to not put on that much weight. His blood  pressures have been lower since he has been coming to rehab.      Expected Outcomes Short: bring in blood pressure cuff. Long: Maintain blood pressures reading at home independently. Short: watch diet since returning from the beach. Long: maintain weight independently.             Core Components/Risk Factors/Patient Goals at Discharge (Final Review):   Goals and Risk Factor Review - 06/29/20 1612      Core Components/Risk Factors/Patient Goals Review   Personal Goals Review Weight Management/Obesity;Lipids;Hypertension    Review Andre Murphy states that he wants to maintain his weight currently. He ate a bit when he went to the beach but still managed to not put on that much weight. His blood pressures have been lower since he has been coming to rehab.    Expected Outcomes Short: watch diet since returning from the beach. Long: maintain weight independently.           ITP Comments:  ITP Comments    Row Name 04/15/20 1302 04/19/20 1518 04/27/20 1507 05/11/20 0552 06/08/20 0645   ITP Comments Initial telephone orientation completed. Diagnosis can be found in [facility] 4/10. EP orientation scheduled for 3/25 at 1pm Completed 6MWT and gym orientation.  Initial ITP created and sent for review to Dr. Emily Filbert, Medical Director. First full day of exercise!  Patient was oriented to gym and equipment including functions, settings, policies, and procedures.  Patient's individual exercise prescription and treatment plan were reviewed.  All starting workloads were established based on the results of the 6 minute walk test done at initial orientation visit.  The plan for exercise progression was also introduced and progression will be customized based on patient's performance and goals. 30 Day review completed. Medical Director ITP review done, changes made as directed, and signed approval by Medical Director. 30 Day review completed. Medical Director ITP review done, changes made as directed, and signed approval by  Medical Director.          Comments:

## 2020-07-07 ENCOUNTER — Other Ambulatory Visit: Payer: Self-pay

## 2020-07-07 ENCOUNTER — Encounter: Payer: Medicare Other | Admitting: *Deleted

## 2020-07-07 DIAGNOSIS — Z951 Presence of aortocoronary bypass graft: Secondary | ICD-10-CM | POA: Diagnosis not present

## 2020-07-07 NOTE — Progress Notes (Signed)
Daily Session Note  Patient Details  Name: MAVRICK MCQUIGG Sr. MRN: [id number] Date of Birth: Jul 02, 1941 Referring Provider:     Cardiac Rehab from 04/19/2020 in [facility] Cardiac and Pulmonary Rehab  Referring Provider [name]      Encounter Date: 07/07/2020  Check In:  Session Check In - 07/07/20 1536      Check-In   Supervising physician immediately available to respond to emergencies See telemetry face sheet for immediately available ER MD    Location [facility]-Cardiac & Pulmonary Rehab    Staff Present Renita Papa, RN BSN;Joseph [address] [place], [place], [place], CCRP, CCET    Virtual Visit No    Medication changes reported     No    Fall or balance concerns reported    No    Warm-up and Cool-down Performed on first and last piece of equipment    Resistance Training Performed Yes    VAD Patient? No    PAD/SET Patient? No      Pain Assessment   Currently in Pain? No/denies              Social History   Tobacco Use  Smoking Status Never Smoker  Smokeless Tobacco Never Used    Goals Met:  Independence with exercise equipment Exercise tolerated well No report of cardiac concerns or symptoms Strength training completed today  Goals Unmet:  Not Applicable  Comments: Pt able to follow exercise prescription today without complaint.  Will continue to monitor for progression.    Dr. Emily Filbert is Medical Director for [place] and [facility].

## 2020-07-13 ENCOUNTER — Encounter: Payer: Medicare Other | Admitting: *Deleted

## 2020-07-13 ENCOUNTER — Other Ambulatory Visit: Payer: Self-pay

## 2020-07-13 DIAGNOSIS — Z951 Presence of aortocoronary bypass graft: Secondary | ICD-10-CM

## 2020-07-13 NOTE — Progress Notes (Signed)
Daily Session Note  Patient Details  Name: Andre IDE Sr. MRN: [id number] Date of Birth: Feb 01, 1941 Referring Provider:     Cardiac Rehab from 04/19/2020 in [facility] Cardiac and Pulmonary Rehab  Referring Provider [name]      Encounter Date: 07/13/2020  Check In:  Session Check In - 07/13/20 [name]      Check-In   Supervising physician immediately available to respond to emergencies See telemetry face sheet for immediately available ER MD    Location [facility]-Cardiac & Pulmonary Rehab    Staff Present Nyoka Cowden, RN, BSN, MA;Joseph Hood [place], [place] Exercise Physiologist    Virtual Visit No    Medication changes reported     No    Fall or balance concerns reported    No    Warm-up and Cool-down Performed on first and last piece of equipment    Resistance Training Performed Yes    VAD Patient? No    PAD/SET Patient? No      Pain Assessment   Currently in Pain? No/denies              Social History   Tobacco Use  Smoking Status Never Smoker  Smokeless Tobacco Never Used    Goals Met:  Independence with exercise equipment Exercise tolerated well No report of cardiac concerns or symptoms Strength training completed today  Goals Unmet:  Not Applicable  Comments: Pt able to follow exercise prescription today without complaint.  Will continue to monitor for progression.   Dr. Emily Filbert is Medical Director for [place] and [facility].

## 2020-07-25 ENCOUNTER — Telehealth: Payer: Self-pay

## 2020-07-25 NOTE — Telephone Encounter (Signed)
Spoke with patient, he has missed rehab appointments for the last week, he reported he was out of town last and confirmed he will be able to come to [facility] this upcoming Wednesday, 9/1.

## 2020-07-27 ENCOUNTER — Encounter: Payer: Medicare Other | Attending: Internal Medicine | Admitting: *Deleted

## 2020-07-27 ENCOUNTER — Other Ambulatory Visit: Payer: Self-pay

## 2020-07-27 DIAGNOSIS — Z79899 Other long term (current) drug therapy: Secondary | ICD-10-CM | POA: Insufficient documentation

## 2020-07-27 DIAGNOSIS — I10 Essential (primary) hypertension: Secondary | ICD-10-CM | POA: Insufficient documentation

## 2020-07-27 DIAGNOSIS — Z7982 Long term (current) use of aspirin: Secondary | ICD-10-CM | POA: Diagnosis not present

## 2020-07-27 DIAGNOSIS — Z951 Presence of aortocoronary bypass graft: Secondary | ICD-10-CM

## 2020-07-27 NOTE — Progress Notes (Signed)
Daily Session Note  Patient Details  Name: Andre MCCORD Sr. MRN: [id number] Date of Birth: October 01, 1941 Referring Provider:     Cardiac Rehab from 04/19/2020 in [facility] Cardiac and Pulmonary Rehab  Referring Provider [name]      Encounter Date: 07/27/2020  Check In:  Session Check In - 07/27/20 1531      Check-In   Supervising physician immediately available to respond to emergencies See telemetry face sheet for immediately available ER MD    Location [facility]-Cardiac & Pulmonary Rehab    Staff Present Renita Papa, RN BSN;Joseph Lou [place], [place] Exercise Physiologist;Melissa Clinton RDN, Rowe Pavy, BA, ACSM CEP, Exercise Physiologist    Virtual Visit No    Medication changes reported     No    Fall or balance concerns reported    No    Warm-up and Cool-down Performed on first and last piece of equipment    Resistance Training Performed Yes    VAD Patient? No    PAD/SET Patient? No      Pain Assessment   Currently in Pain? No/denies              Social History   Tobacco Use  Smoking Status Never Smoker  Smokeless Tobacco Never Used    Goals Met:  Independence with exercise equipment Exercise tolerated well No report of cardiac concerns or symptoms Strength training completed today  Goals Unmet:  Not Applicable  Comments: Pt able to follow exercise prescription today without complaint.  Will continue to monitor for progression.    Dr. Emily Filbert is Medical Director for [place] and [facility].

## 2020-07-28 ENCOUNTER — Encounter: Payer: Medicare Other | Admitting: *Deleted

## 2020-07-28 ENCOUNTER — Other Ambulatory Visit: Payer: Self-pay

## 2020-07-28 DIAGNOSIS — Z951 Presence of aortocoronary bypass graft: Secondary | ICD-10-CM

## 2020-07-28 NOTE — Progress Notes (Signed)
Daily Session Note  Patient Details  Name: Andre SEDA Sr. MRN: [id number] Date of Birth: 1941-08-17 Referring Provider:     Cardiac Rehab from 04/19/2020 in [facility] Cardiac and Pulmonary Rehab  Referring Provider [name]      Encounter Date: 07/28/2020  Check In:  Session Check In - 07/28/20 1536      Check-In   Supervising physician immediately available to respond to emergencies See telemetry face sheet for immediately available ER MD    Location [facility]-Cardiac & Pulmonary Rehab    Staff Present Renita Papa, RN BSN;Jessica Luan Pulling, MA, RCEP, CCRP, CCET    Virtual Visit No    Medication changes reported     No    Fall or balance concerns reported    No    Warm-up and Cool-down Performed on first and last piece of equipment    Resistance Training Performed Yes    VAD Patient? No    PAD/SET Patient? No      Pain Assessment   Currently in Pain? No/denies              Social History   Tobacco Use  Smoking Status Never Smoker  Smokeless Tobacco Never Used    Goals Met:  Independence with exercise equipment Exercise tolerated well No report of cardiac concerns or symptoms Strength training completed today  Goals Unmet:  Not Applicable  Comments: Pt able to follow exercise prescription today without complaint.  Will continue to monitor for progression.    Dr. Emily Filbert is Medical Director for [place] and [facility].

## 2020-08-02 ENCOUNTER — Telehealth: Payer: Self-pay

## 2020-08-02 NOTE — Telephone Encounter (Signed)
Patient returned call from earlier, stated he will not be able to attend rehab tomorrow, 9/8, but is able to attend the rest of his regular sessions at normal schedule. Patient agreed to be discharged at session 36 as planned.

## 2020-08-02 NOTE — Telephone Encounter (Signed)
Pt left message that he will be out until October 3rd; called and left message to call back and confirm that is the case as we would have to discharge him at this time. Also called mobile number listed, but call was ended - unable to leave message.

## 2020-08-03 ENCOUNTER — Encounter: Payer: Self-pay | Admitting: *Deleted

## 2020-08-03 DIAGNOSIS — Z951 Presence of aortocoronary bypass graft: Secondary | ICD-10-CM

## 2020-08-03 NOTE — Progress Notes (Signed)
Cardiac Individual Treatment Plan  Patient Details  Name: Andre FANCHER Sr. MRN: [id number] Date of Birth: Apr 09, 1941 Referring Provider:     Cardiac Rehab from 04/19/2020 in [facility] Cardiac and Pulmonary Rehab  Referring Provider [name]      Initial Encounter Date:    Cardiac Rehab from 04/19/2020 in [facility] Cardiac and Pulmonary Rehab  Date 04/19/20      Visit Diagnosis: S/P CABG x 3  Patient's Home Medications on Admission:  Current Outpatient Medications:  .  Acetaminophen 325 MG CAPS, Take by mouth., Disp: , Rfl:  .  amiodarone (PACERONE) 200 MG tablet, Take 400 mg by mouth three times daily through 4/29.  On, 4/30 reduce to 220m by mouth daily, Disp: , Rfl:  .  aspirin EC 81 MG tablet, Take 81 mg by mouth daily., Disp: , Rfl:  .  atorvastatin (LIPITOR) 40 MG tablet, Take by mouth., Disp: , Rfl:  .  carboxymethylcellulose (REFRESH PLUS) 0.5 % SOLN, Apply to eye., Disp: , Rfl:  .  furosemide (LASIX) 40 MG tablet, Take 1 tablet (40 mg total) by mouth daily., Disp: 30 tablet, Rfl: 0 .  isosorbide mononitrate (IMDUR) 30 MG 24 hr tablet, Take 1 tablet (30 mg total) by mouth daily. (Patient not taking: Reported on 04/15/2020), Disp: 30 tablet, Rfl: 0 .  lisinopril (ZESTRIL) 5 MG tablet, Take 1 tablet (5 mg total) by mouth daily., Disp: 30 tablet, Rfl: 0 .  metoprolol tartrate (LOPRESSOR) 50 MG tablet, Take 1 tablet (50 mg total) by mouth 2 (two) times daily., Disp: 60 tablet, Rfl: 0 .  spironolactone (ALDACTONE) 25 MG tablet, Take 0.5 tablets (12.5 mg total) by mouth daily. (Patient not taking: Reported on 04/15/2020), Disp: 15 tablet, Rfl: 0 .  temazepam (RESTORIL) 15 MG capsule, Take 15 mg by mouth at bedtime as needed., Disp: , Rfl:  .  vitamin B-12 (CYANOCOBALAMIN) 250 MCG tablet, Take 250 mcg by mouth as needed., Disp: , Rfl:   Past Medical History: Past Medical History:  Diagnosis Date  . Bursitis   . Elevated lipids   . Hypertension   . SOB (shortness of breath)     Tobacco  Use: Social History   Tobacco Use  Smoking Status Never Smoker  Smokeless Tobacco Never Used    Labs: Recent Review [employer], physiological   Labs for ITP Cardiac and Pulmonary Rehab Latest Ref Rng & Units 03/06/2020   Cholestrol 0 - 200 mg/dL 144   LDLCALC 0 - 99 mg/dL 86   HDL >40 mg/dL 46   Trlycerides <150 mg/dL 62   Hemoglobin A1c 4.8 - 5.6 % 5.4       Exercise Target Goals: Exercise Program Goal: Individual exercise prescription set using results from initial 6 min walk test and THRR while considering  patient's activity barriers and safety.   Exercise Prescription Goal: Initial exercise prescription builds to 30-45 minutes a day of aerobic activity, 2-3 days per week.  Home exercise guidelines will be given to patient during program as part of exercise prescription that the participant will acknowledge.   Education: Aerobic Exercise & Resistance Training: - Gives group verbal and written instruction on the various components of exercise. Focuses on aerobic and resistive training programs and the benefits of this training and how to safely progress through these programs..   Education: Exercise & Equipment Safety: - Individual verbal instruction and demonstration of equipment use and safety with use of the equipment.   [facility]  Date 04/19/20  Educator AS  Instruction Review Code 1- Verbalizes Understanding      Education: Exercise Physiology & General Exercise Guidelines: - Group verbal and written instruction with models to review the exercise physiology of the cardiovascular system and associated critical values. Provides general exercise guidelines with specific guidelines to those with heart or lung disease.    Education: Flexibility, Balance, Mind/Body Relaxation: Provides group verbal/written instruction on the benefits of flexibility and balance training, including mind/body exercise modes such as yoga, pilates  and tai chi.  Demonstration and skill practice provided.   Activity Barriers & Risk Stratification:  Activity Barriers & Cardiac Risk Stratification - 04/15/20 1312      Activity Barriers & Cardiac Risk Stratification   Activity Barriers Back Problems   messed up lower lumbar region in high school   Cardiac Risk Stratification High           6 Minute Walk:  6 Minute Walk    Row Name 04/19/20 1457         6 Minute Walk   Phase Initial     Distance 890 feet     Walk Time 6 minutes     # of Rest Breaks 0     MPH 1.7     METS 1.89     RPE 8     Perceived Dyspnea  0     VO2 Peak 6.6     Symptoms Yes (comment)     Comments L leg sore where they took vein out     Resting HR 75 bpm     Resting BP 102/52     Resting Oxygen Saturation  98 %     Exercise Oxygen Saturation  during 6 min walk 98 %     Max Ex. HR 95 bpm     Max Ex. BP 126/60     2 Minute Post BP 106/58            Oxygen Initial Assessment:   Oxygen Re-Evaluation:   Oxygen Discharge (Final Oxygen Re-Evaluation):   Initial Exercise Prescription:  Initial Exercise Prescription - 04/19/20 1500      Date of Initial Exercise RX and Referring Provider   Date 04/19/20    Referring Provider [name]      Treadmill   MPH 1.7    Grade 0    Minutes 15    METs 2.3      NuStep   Level 1    SPM 80    Minutes 15    METs 2      REL-XR   Level 1    Speed 50    Minutes 15    METs 2      Prescription Details   Frequency (times per week) 3    Duration Progress to 30 minutes of continuous aerobic without signs/symptoms of physical distress      Intensity   THRR 40-80% of Max Heartrate 102-128    Ratings of Perceived Exertion 11-13    Perceived Dyspnea 0-4      Resistance Training   Training Prescription Yes    Weight 3 lb    Reps 10-15           Perform Capillary Blood Glucose checks as needed.  Exercise Prescription Changes:  Exercise Prescription Changes    Row Name 04/19/20 1500  05/05/20 1300 05/16/20 1400 06/01/20 1400 06/13/20 1300     Response to Exercise   Blood Pressure (  Admit) 102/52 132/70 108/60 134/66 120/62   Blood Pressure (Exercise) 126/60 158/80 146/70 140/80 138/62   Blood Pressure (Exit) 106/58 124/64 110/60 126/72 122/60   Heart Rate (Admit) 75 bpm 90 bpm 72 bpm 97 bpm 64 bpm   Heart Rate (Exercise) 95 bpm 123 bpm 96 bpm 121 bpm 107 bpm   Heart Rate (Exit) 87 bpm 102 bpm 82 bpm 94 bpm 80 bpm   Oxygen Saturation (Admit) 98 % -- -- -- --   Oxygen Saturation (Exercise) 98 % -- -- -- --   Rating of Perceived Exertion (Exercise) 8 13 12 12 12    Perceived Dyspnea (Exercise) 0 -- -- -- --   Symptoms L leg sore none none none none   Comments -- third full day of exercise.  -- -- --   Duration -- Continue with 30 min of aerobic exercise without signs/symptoms of physical distress. Continue with 30 min of aerobic exercise without signs/symptoms of physical distress. Continue with 30 min of aerobic exercise without signs/symptoms of physical distress. Continue with 30 min of aerobic exercise without signs/symptoms of physical distress.   Intensity -- THRR unchanged THRR unchanged THRR unchanged THRR unchanged     Progression   Progression -- Continue to progress workloads to maintain intensity without signs/symptoms of physical distress. Continue to progress workloads to maintain intensity without signs/symptoms of physical distress. Continue to progress workloads to maintain intensity without signs/symptoms of physical distress. Continue to progress workloads to maintain intensity without signs/symptoms of physical distress.   Average METs -- 2.25 2.5 3.5 3.15     Resistance Training   Training Prescription -- Yes Yes Yes Yes   Weight -- 3 lb 3 lb 3 lb 3 lb   Reps -- 10-15 10-15 10-15 10-15     Interval Training   Interval Training -- No No No No     Treadmill   MPH -- 1.7 1.7 2.2 2.2   Grade -- 0 0 4.5 4.5   Minutes -- 15 15 15 15    METs -- 2.3 2.3  4.05 4.05     Recumbant Bike   Level -- -- 1 -- --   Watts -- -- 16 -- --   Minutes -- -- 15 -- --   METs -- -- 2.39 -- --     NuStep   Level -- 1 -- -- 2   Minutes -- 15 -- -- 15   METs -- 2.5 -- -- 2.4     REL-XR   Level -- 1 1 -- --   Minutes -- 15 15 -- --   METs -- 2.2 2.6 -- --     Biostep-RELP   Level -- 1 -- 3 3   SPM -- -- -- 50 --   Minutes -- 15 -- 15 15   METs -- 2 -- 3 3   Row Name 06/30/20 0900 07/12/20 1300 07/28/20 0800 07/28/20 1500       Response to Exercise   Blood Pressure (Admit) 134/60 148/80 166/88 --    Blood Pressure (Exercise) 128/70 162/82 142/68 --    Blood Pressure (Exit) 130/70 138/80 128/70 --    Heart Rate (Admit) 75 bpm 89 bpm 88 bpm --    Heart Rate (Exercise) 118 bpm 105 bpm 106 bpm --    Heart Rate (Exit) 78 bpm 88 bpm 91 bpm --    Rating of Perceived Exertion (Exercise) 12 12 12  --    Symptoms none none none --  Duration Continue with 30 min of aerobic exercise without signs/symptoms of physical distress. Continue with 30 min of aerobic exercise without signs/symptoms of physical distress. Continue with 30 min of aerobic exercise without signs/symptoms of physical distress. --    Intensity THRR unchanged THRR unchanged THRR unchanged --      Progression   Progression Continue to progress workloads to maintain intensity without signs/symptoms of physical distress. Continue to progress workloads to maintain intensity without signs/symptoms of physical distress. Continue to progress workloads to maintain intensity without signs/symptoms of physical distress. --    Average METs 3.7 2.87 2.6 --      Resistance Training   Training Prescription Yes Yes Yes --    Weight 3 lb 3 lb 3 lb --    Reps 10-15 10-15 10-15 --      Interval Training   Interval Training No No No --      Treadmill   MPH 2.2 1.9 2 --    Grade 4.5 3.5 3.5 --    Minutes $Remove'15 15 15 '[id number]$ --    METs 4.05 3.5 3.5 --      Recumbant Elliptical   Level -- 4 -- --    Minutes  -- 15 -- --    METs -- 2 -- --      REL-XR   Level -- 3 4 --    Speed -- -- 50 --    Minutes -- 15 15 --    METs -- 3.1 1.6 --      Biostep-RELP   Level 3 -- -- --    SPM 50 -- -- --    Minutes 15 -- -- --    METs 3.3 -- -- --      Home Exercise Plan   Plans to continue exercise at -- -- -- [employer] (comment)  [employer]    Frequency -- -- -- Add 2 additional days to program exercise sessions.    Initial Home Exercises Provided -- -- -- 07/28/20           Exercise Comments:   Exercise Goals and Review:  Exercise Goals    Row Name 04/19/20 1510             Exercise Goals   Increase Physical Activity Yes       Intervention Provide advice, education, support and counseling about physical activity/exercise needs.;Develop an individualized exercise prescription for aerobic and resistive training based on initial evaluation findings, risk stratification, comorbidities and participant's personal goals.       Expected Outcomes Short Term: Attend rehab on a regular basis to increase amount of physical activity.;Long Term: Add in home exercise to make exercise part of routine and to increase amount of physical activity.;Long Term: Exercising regularly at least 3-5 days a week.       Increase Strength and Stamina Yes       Intervention Provide advice, education, support and counseling about physical activity/exercise needs.;Develop an individualized exercise prescription for aerobic and resistive training based on initial evaluation findings, risk stratification, comorbidities and participant's personal goals.       Expected Outcomes Short Term: Increase workloads from initial exercise prescription for resistance, speed, and METs.;Short Term: Perform resistance training exercises routinely during rehab and add in resistance training at home;Long Term: Improve cardiorespiratory fitness, muscular endurance and strength as measured by increased METs and functional capacity (6MWT)        Able to understand and use rate of perceived exertion (RPE) scale Yes  Intervention Provide education and explanation on how to use RPE scale       Expected Outcomes Short Term: Able to use RPE daily in rehab to express subjective intensity level;Long Term:  Able to use RPE to guide intensity level when exercising independently       Knowledge and understanding of Target Heart Rate Range (THRR) Yes       Intervention Provide education and explanation of THRR including how the numbers were predicted and where they are located for reference       Expected Outcomes Short Term: Able to state/look up THRR;Short Term: Able to use daily as guideline for intensity in rehab;Long Term: Able to use THRR to govern intensity when exercising independently       Able to check pulse independently Yes       Intervention Provide education and demonstration on how to check pulse in carotid and radial arteries.;Review the importance of being able to check your own pulse for safety during independent exercise       Expected Outcomes Short Term: Able to explain why pulse checking is important during independent exercise;Long Term: Able to check pulse independently and accurately       Understanding of Exercise Prescription Yes       Intervention Provide education, explanation, and written materials on patient's individual exercise prescription       Expected Outcomes Short Term: Able to explain program exercise prescription;Long Term: Able to explain home exercise prescription to exercise independently              Exercise Goals Re-Evaluation :  Exercise Goals Re-Evaluation    Row Name 04/27/20 1508 05/05/20 1302 05/16/20 1445 05/23/20 1511 06/01/20 1443     Exercise Goal Re-Evaluation   Exercise Goals Review Increase Physical Activity;Able to understand and use rate of perceived exertion (RPE) scale;Knowledge and understanding of Target Heart Rate Range (THRR);Understanding of Exercise  Prescription;Increase Strength and Stamina;Able to check pulse independently Increase Physical Activity;Increase Strength and Stamina;Understanding of Exercise Prescription Increase Physical Activity;Increase Strength and Stamina;Understanding of Exercise Prescription Increase Physical Activity;Increase Strength and Stamina;Understanding of Exercise Prescription Increase Physical Activity;Increase Strength and Stamina;Understanding of Exercise Prescription   Comments Reviewed RPE and dyspnea scales, THR and program prescription with pt today.  Pt voiced understanding and was given a copy of goals to take home. Andre Murphy is off to a good start in rehab.  He is already up over 2 METs overall even with only 3 full day sessions. We will continue to montior his progress. Andre Murphy continues to do well in rehab.  He is up to 16 watts on the bike.  We will continue to monitor his progress. Andre Murphy walks at home since his driveway is over a quarter mile long. Informed him that needs to get his hear rate up when he walks are home in between rehab days. Patient also needs to go over home exercise with EP. Andre Murphy has increased speed and grade on TM.  Staff will encourage him to increase to 4 lb for strength work.   Expected Outcomes Short: Use RPE daily to regulate intensity. Long: Follow program prescription in THR. Short: Continue to attend regularly Long; Continue to follow program prescription. Short: Add incline to treadmill Long: Continue to improve stamina. Short: add two days of walking at home. Go over home exercise with EP Long: maintain an exercise routine post [facility] independently. Short: increase weight for strength work Long:  build overall stamina   Row Name 06/13/20 1338 06/29/20  1616 06/30/20 0904 07/12/20 1325 07/28/20 0827     Exercise Goal Re-Evaluation   Exercise Goals Review Increase Physical Activity;Increase Strength and Stamina;Understanding of Exercise Prescription Increase Physical  Activity;Increase Strength and Stamina Increase Physical Activity;Increase Strength and Stamina Increase Physical Activity;Increase Strength and Stamina;Understanding of Exercise Prescription Increase Physical Activity;Increase Strength and Stamina;Understanding of Exercise Prescription   Comments Andre Murphy continues to do well in rehab.  He is now up to 4.5% grade on the treadmill.  We will continue to encourage him to increase his weights and monitor his progress. Andre Murphy states that when he is done with rehab he wants to join the [facility]. He has not been exercising at home since it has been to hot out. [facility] Informed him that he can walk outside when it is cool out. -- Andre Murphy is doing well in rehab.  He is nearing graduation and we should see an improvement in his post 6MWT.  He is now up to level 4 on the recumbent elliptical.  We will continue to monitor his progress. Andre Murphy has only attended once since last review.   He has increased TM to 2/3.5 %.  He is due for post 6MWT   Expected Outcomes Short: Increase hand weights Long: Continue to improve stamina. Short: start to exercise at home again. Long: maintain and exercise routine at home [facility]. -- Short: Improve post 6MWT Long: Continue to improve stamina Short: complete HT program Long:  maintain fitness on his own   Council Name 07/28/20 1532             Exercise Goal Re-Evaluation   Exercise Goals Review Increase Physical Activity;Increase Strength and Stamina;Understanding of Exercise Prescription       Comments Andre [place] He is doing well and feeling better overall.  His stamina has picked up and he feels good.       Expected Outcomes Short: Continue to attend regularly Long: Continue to improve stamina.              Discharge Exercise Prescription (Final Exercise Prescription Changes):  Exercise Prescription Changes - 07/28/20  1500      Home Exercise Plan   Plans to continue exercise at [facility] (comment)   [employer]   Frequency Add 2 additional days to program exercise sessions.    Initial Home Exercises Provided 07/28/20           Nutrition:  Target Goals: Understanding of nutrition guidelines, daily intake of sodium '1500mg'$ , cholesterol '200mg'$ , calories 30% from fat and 7% or less from saturated fats, daily to have 5 or more servings of fruits and vegetables.  Education: Controlling Sodium/Reading Food Labels -Group verbal and written material supporting the discussion of sodium use in heart healthy nutrition. Review and explanation with models, verbal and written materials for utilization of the food label.   Education: General Nutrition Guidelines/Fats and Fiber: -Group instruction provided by verbal, written material, models and posters to present the general guidelines for heart healthy nutrition. Gives an explanation and review of dietary fats and fiber.   Biometrics:  Pre Biometrics - 04/19/20 1514      Pre Biometrics   Height 5' 9.5" (1.765 m)    Weight 168 lb 12.8 oz (76.6 kg)    BMI (Calculated) 24.58    Single Leg Stand 22.41 seconds  Nutrition Therapy Plan and Nutrition Goals:  Nutrition Therapy & Goals - 05/02/20 1606      Nutrition Therapy   Diet Low Na, heart healthy    Protein (specify units) 60-65g    Fiber 30 grams    Whole Grain Foods 3 servings    Saturated Fats 12 max. grams    Fruits and Vegetables 5 servings/day    Sodium 1.5 grams      Personal Nutrition Goals   Nutrition Goal LT: increase strength, get back to activities    Comments cheerios (loves frosted flakes) with a banana or sometimes eggs and bacon with toast and jam and some orange juice. L: sandwich (chicken slalad or ham and [place]) - whole wheat. Discussed heart healthy eating.      Intervention Plan   Intervention Prescribe, educate and counsel regarding individualized specific  dietary modifications aiming towards targeted core components such as weight, hypertension, lipid management, diabetes, heart failure and other comorbidities.;Nutrition handout(s) given to patient.    Expected Outcomes Short Term Goal: Understand basic principles of dietary content, such as calories, fat, sodium, cholesterol and nutrients.;Short Term Goal: A plan has been developed with personal nutrition goals set during dietitian appointment.;Long Term Goal: Adherence to prescribed nutrition plan.           Nutrition Assessments:  Nutrition Assessments - 04/19/20 1517      MEDFICTS Scores   Pre Score 19           MEDIFICTS Score Key:          ?70 Need to make dietary changes          40-70 Heart Healthy Diet         ? 40 Therapeutic Level Cholesterol Diet  Nutrition Goals Re-Evaluation:  Nutrition Goals Re-Evaluation    Andre Murphy Name 07/27/20 1633             Goals   Nutrition Goal LT: increase strength, get back to activities       Comment Continue with current changes       Expected Outcome LT: increase strength, get back to activities              Nutrition Goals Discharge (Final Nutrition Goals Re-Evaluation):  Nutrition Goals Re-Evaluation - 07/27/20 1633      Goals   Nutrition Goal LT: increase strength, get back to activities    Comment Continue with current changes    Expected Outcome LT: increase strength, get back to activities           Psychosocial: Target Goals: Acknowledge presence or absence of significant depression and/or stress, maximize coping skills, provide positive support system. Participant is able to verbalize types and ability to use techniques and skills needed for reducing stress and depression.   Education: Depression - Provides group verbal and written instruction on the correlation between heart/lung disease and depressed mood, treatment options, and the stigmas associated with seeking treatment.   Cardiac Rehab from 07/06/2020 in  [facility] Cardiac and Pulmonary Rehab  Date 06/29/20  Educator AS  Instruction Review Code 1- Verbalizes Understanding      Education: Sleep Hygiene -Provides group verbal and written instruction about how sleep can affect your health.  Define sleep hygiene, discuss sleep cycles and impact of sleep habits. Review good sleep hygiene tips.     Education: Stress and Anxiety: - Provides group verbal and written instruction about the health risks of elevated stress and causes of high stress.  Discuss the correlation  between heart/lung disease and anxiety and treatment options. Review healthy ways to manage with stress and anxiety.   Cardiac Rehab from 07/06/2020 in [facility] Cardiac and Pulmonary Rehab  Date 06/29/20  Educator AS  Instruction Review Code 1- Verbalizes Understanding       Initial Review & Psychosocial Screening:  Initial Psych Review & Screening - 04/15/20 1305      Initial Review   Current issues with Current Stress Concerns      Family Dynamics   Good Support System? Yes      Barriers   Psychosocial barriers to participate in program There are no identifiable barriers or psychosocial needs.;The patient should benefit from training in stress management and relaxation.      Screening Interventions   Interventions Encouraged to exercise;To provide support and resources with identified psychosocial needs;Provide feedback about the scores to participant    Expected Outcomes Short Term goal: Utilizing psychosocial counselor, staff and physician to assist with identification of specific Stressors or current issues interfering with healing process. Setting desired goal for each stressor or current issue identified.;Long Term Goal: Stressors or current issues are controlled or eliminated.;Short Term goal: Identification and review with participant of any Quality of Life or Depression concerns found by scoring the questionnaire.;Long Term goal: The participant improves quality of Life and  PHQ9 Scores as seen by post scores and/or verbalization of changes           Quality of Life Scores:   Quality of Life - 04/19/20 1515      Quality of Life   Select Quality of Life      Quality of Life Scores   Health/Function Pre 18.8 %    Socioeconomic Pre 29 %    Psych/Spiritual Pre 28.29 %    Family Pre 18 %    GLOBAL Pre 22.25 %          Scores of 19 and below usually indicate a poorer quality of life in these areas.  A difference of  2-3 points is a clinically meaningful difference.  A difference of 2-3 points in the total score of the Quality of Life Index has been associated with significant improvement in overall quality of life, self-image, physical symptoms, and general health in studies assessing change in quality of life.  PHQ-9: Recent Review Flowsheet Data    Depression screen [facility] 2/9 06/29/2020 05/23/2020 04/19/2020   Decreased Interest 0 2 0   Down, Depressed, Hopeless 0 2 1   PHQ - 2 Score 0 4 1   Altered sleeping 0 0 1   Tired, decreased energy 0 0 1   Change in appetite 0 0 1   Feeling bad or failure about yourself  $RemoveB'1 1 1   'gJiLhNFu$ Trouble concentrating 0 0 0   Moving slowly or fidgety/restless 0 0 0   Suicidal thoughts 0 0 0   PHQ-9 Score $RemoveBef'1 5 5   'sdAEtxuWNj$ Difficult doing work/chores Not difficult at all Somewhat difficult Somewhat difficult     Interpretation of Total Score  Total Score Depression Severity:  1-4 = Minimal depression, 5-9 = Mild depression, 10-14 = Moderate depression, 15-19 = Moderately severe depression, 20-27 = Severe depression   Psychosocial Evaluation and Intervention:  Psychosocial Evaluation - 04/15/20 1317      Psychosocial Evaluation & Interventions   Comments Andre Murphy reports doing well, although this heart surgery came as a surprise to him. He said the symptoms hit him suddenly. He states he is feeling much better, but  wants to get back to his "everyday stuff" (i.e. mowing, taking care of his properties). His wife and he are excited about him  starting Cardiac Rehab and gaining a better understanding of heart healthy living.    Expected Outcomes Short: attend Cardiac Rehab for education and exercise. Long: maintain positive self care habits.    Continue Psychosocial Services  Follow up required by staff           Psychosocial Re-Evaluation:  Psychosocial Re-Evaluation    Andre Murphy Name 05/23/20 1522 06/29/20 1611 07/28/20 1537         Psychosocial Re-Evaluation   Current issues with History of Depression None Identified None Identified     Comments Reviewed patient health questionnaire (PHQ-9) with patient for follow up. Previously, patients score indicated signs/symptoms of depression.  Reviewed to see if patient is improving symptom wise while in program.  Score stayed the same and patient states that it is because the loss of his dog last week. Reviewed patient health questionnaire (PHQ-9) with patient for follow up. Previously, patients score indicated signs/symptoms of depression.  Reviewed to see if patient is improving symptom wise while in program.  Score improved and patient states that it is because he has been able to come to rehab and exercise. Andre Murphy is doing well mentally. He would like to be able to do more like he used to.  He is still missing the energy he had before.  He has gotten back to the [facility]. He is sleeping ok.  He lost his dog right after is surgery, and still mourning her loss.     Expected Outcomes Short: Continue to work toward an improvement in Andre Murphy scores by attending [facility] regularly. Long: Continue to improve stress and depression coping skills by talking with staff and attending [facility] regularly and work toward a positive mental state. Short: Continue to attend [facility] regularly for regular exercise and social engagement. Long: Continue to improve symptoms and manage a positive mental state. Short: Finish up rehab  Long:  Continue to focus on positive.     Interventions Encouraged to attend  Cardiac Rehabilitation for the exercise Encouraged to attend Cardiac Rehabilitation for the exercise Encouraged to attend Cardiac Rehabilitation for the exercise     Continue Psychosocial Services  Follow up required by staff No Follow up required No Follow up required            Psychosocial Discharge (Final Psychosocial Re-Evaluation):  Psychosocial Re-Evaluation - 07/28/20 1537      Psychosocial Re-Evaluation   Current issues with None Identified    Comments Andre Murphy is doing well mentally. He would like to be able to do more like he used to.  He is still missing the energy he had before.  He has gotten back to the [facility]. He is sleeping ok.  He lost his dog right after is surgery, and still mourning her loss.    Expected Outcomes Short: Finish up rehab  Long:  Continue to focus on positive.    Interventions Encouraged to attend Cardiac Rehabilitation for the exercise    Continue Psychosocial Services  No Follow up required           Vocational Rehabilitation: Provide vocational rehab assistance to qualifying candidates.   Vocational Rehab Evaluation & Intervention:  Vocational Rehab - 04/15/20 1305      Initial Vocational Rehab Evaluation & Intervention   Assessment shows need for Vocational Rehabilitation No           Education:  Education Goals: Education classes will be provided on a variety of topics geared toward better understanding of heart health and risk factor modification. Participant will state understanding/return demonstration of topics presented as noted by education test scores.  Learning Barriers/Preferences:  Learning Barriers/Preferences - 04/15/20 1305      Learning Barriers/Preferences   Learning Barriers None    Learning Preferences None           General Cardiac Education Topics:  AED/CPR: - Group verbal and written instruction with the use of models to demonstrate the basic use of the AED with the basic ABC's of resuscitation.   Anatomy &  Physiology of the Heart: - Group verbal and written instruction and models provide basic cardiac anatomy and physiology, with the coronary electrical and arterial systems. Review of Valvular disease and Heart Failure   Cardiac Procedures: - Group verbal and written instruction to review commonly prescribed medications for heart disease. Reviews the medication, class of the drug, and side effects. Includes the steps to properly store meds and maintain the prescription regimen. (beta blockers and nitrates)   Cardiac Medications I: - Group verbal and written instruction to review commonly prescribed medications for heart disease. Reviews the medication, class of the drug, and side effects. Includes the steps to properly store meds and maintain the prescription regimen.   Cardiac Medications II: -Group verbal and written instruction to review commonly prescribed medications for heart disease. Reviews the medication, class of the drug, and side effects. (all other drug classes)   Cardiac Rehab from 07/06/2020 in [facility] Cardiac and Pulmonary Rehab  Date 07/06/20  Educator AS  Instruction Review Code 1- Verbalizes Understanding       Go Sex-Intimacy & Heart Disease, Get SMART - Goal Setting: - Group verbal and written instruction through game format to discuss heart disease and the return to sexual intimacy. Provides group verbal and written material to discuss and apply goal setting through the application of the S.M.A.R.T. Method.   Other Matters of the Heart: - Provides group verbal, written materials and models to describe Stable Angina and Peripheral Artery. Includes description of the disease process and treatment options available to the cardiac patient.   Infection Prevention: - Provides verbal and written material to individual with discussion of infection control including proper hand washing and proper equipment cleaning during exercise session.   Cardiac Rehab from 07/06/2020 in [facility]  Cardiac and Pulmonary Rehab  Date 04/19/20  Educator AS  Instruction Review Code 1- Verbalizes Understanding      Falls Prevention: - Provides verbal and written material to individual with discussion of falls prevention and safety.   Cardiac Rehab from 07/06/2020 in [facility] Cardiac and Pulmonary Rehab  Date 04/19/20  Educator AS  Instruction Review Code 1- Verbalizes Understanding      Other: -Provides group and verbal instruction on various topics (see comments)   Knowledge Questionnaire Score:  Knowledge Questionnaire Score - 04/19/20 1514      Knowledge Questionnaire Score   Pre Score 21/26  angina/nutrition /exercise           Core Components/Risk Factors/Patient Goals at Admission:  Personal Goals and Risk Factors at Admission - 04/19/20 1519      Core Components/Risk Factors/Patient Goals on Admission    Weight Management Yes;Weight Maintenance    Intervention Weight Management: Develop a combined nutrition and exercise program designed to reach desired caloric intake, while maintaining appropriate intake of nutrient and fiber, sodium and fats, and appropriate energy expenditure required for  the weight goal.;Weight Management: Provide education and appropriate resources to help participant work on and attain dietary goals.    Admit Weight 168 lb 12.8 oz (76.6 kg)    Goal Weight: Short Term 165 lb (74.8 kg)    Goal Weight: Long Term 165 lb (74.8 kg)    Expected Outcomes Short Term: Continue to assess and modify interventions until short term weight is achieved;Long Term: Adherence to nutrition and physical activity/exercise program aimed toward attainment of established weight goal;Weight Maintenance: Understanding of the daily nutrition guidelines, which includes 25-35% calories from fat, 7% or less cal from saturated fats, less than $RemoveB'200mg'qfxoORuH$  cholesterol, less than 1.5gm of sodium, & 5 or more servings of fruits and vegetables daily    Intervention Provide education on  lifestyle modifcations including regular physical activity/exercise, weight management, moderate sodium restriction and increased consumption of fresh fruit, vegetables, and low fat dairy, alcohol moderation, and smoking cessation.;Monitor prescription use compliance.    Expected Outcomes Short Term: Continued assessment and intervention until BP is < 140/12mm HG in hypertensive participants. < 130/87mm HG in hypertensive participants with diabetes, heart failure or chronic kidney disease.;Long Term: Maintenance of blood pressure at goal levels.    Intervention Provide education and support for participant on nutrition & aerobic/resistive exercise along with prescribed medications to achieve LDL '70mg'$ , HDL >$Remo'40mg'AOhpo$ .    Expected Outcomes Short Term: Participant states understanding of desired cholesterol values and is compliant with medications prescribed. Participant is following exercise prescription and nutrition guidelines.;Long Term: Cholesterol controlled with medications as prescribed, with individualized exercise RX and with personalized nutrition plan. Value goals: LDL < $Rem'70mg'rlaQ$ , HDL > 40 mg.           Education:Diabetes - Individual verbal and written instruction to review signs/symptoms of diabetes, desired ranges of glucose level fasting, after meals and with exercise. Acknowledge that pre and post exercise glucose checks will be done for 3 sessions at entry of program.   Education: Know Your Numbers and Risk Factors: -Group verbal and written instruction about important numbers in your health.  Discussion of what are risk factors and how they play a role in the disease process.  Review of Cholesterol, Blood Pressure, Diabetes, and BMI and the role they play in your overall health.   Cardiac Rehab from 07/06/2020 in [facility] Cardiac and Pulmonary Rehab  Date 07/06/20  Educator AS  Instruction Review Code 1- Verbalizes Understanding      Core Components/Risk Factors/Patient Goals Review:    Goals and Risk Factor Review    Row Name 05/23/20 1525 06/29/20 1612 07/28/20 1540         Core Components/Risk Factors/Patient Goals Review   Personal Goals Review Weight Management/Obesity;Hypertension;Lipids Weight Management/Obesity;Lipids;Hypertension Weight Management/Obesity;Lipids;Hypertension     Review Andre Murphy was checking his blood pressure at home and has since stopped. Informed patient to bring in his home blood pressure cuff to check against. Patient is taking his blood pressure medications and lipids medications and has no questions regarding them. Andre Murphy states that he wants to maintain his weight currently. He ate a bit when he went to the beach but still managed to not put on that much weight. His blood pressures have been lower since he has been coming to rehab. Andre Murphy is maintaining his weight and doing well overall. Blood pressures have been doing pretty good overall.     Expected Outcomes Short: bring in blood pressure cuff. Long: Maintain blood pressures reading at home independently. Short: watch diet since returning from the beach.  Long: maintain weight independently. Short: Continue to monitor blood pressure  Long; Conitnue to maintain weight and monitor his risk factors.            Core Components/Risk Factors/Patient Goals at Discharge (Final Review):   Goals and Risk Factor Review - 07/28/20 1540      Core Components/Risk Factors/Patient Goals Review   Personal Goals Review Weight Management/Obesity;Lipids;Hypertension    Review Andre Murphy is maintaining his weight and doing well overall. Blood pressures have been doing pretty good overall.    Expected Outcomes Short: Continue to monitor blood pressure  Long; Conitnue to maintain weight and monitor his risk factors.           ITP Comments:  ITP Comments    Row Name 04/15/20 1302 04/19/20 1518 04/27/20 1507 05/11/20 0552 06/08/20 0645   ITP Comments Initial telephone orientation completed. Diagnosis can be found in [facility]  4/10. EP orientation scheduled for 3/25 at 1pm Completed 6MWT and gym orientation.  Initial ITP created and sent for review to Dr. Emily Filbert, Medical Director. First full day of exercise!  Patient was oriented to gym and equipment including functions, settings, policies, and procedures.  Patient's individual exercise prescription and treatment plan were reviewed.  All starting workloads were established based on the results of the 6 minute walk test done at initial orientation visit.  The plan for exercise progression was also introduced and progression will be customized based on patient's performance and goals. 30 Day review completed. Medical Director ITP review done, changes made as directed, and signed approval by Medical Director. 30 Day review completed. Medical Director ITP review done, changes made as directed, and signed approval by Medical Director.   Andre Murphy Name 07/06/20 0611 07/25/20 1723 08/03/20 1539       ITP Comments 30 Day review completed. Medical Director ITP review done, changes made as directed, and signed approval by Medical Director. Spoke with patient, he has missed rehab appointments for the last week, he reported he was out of town last and confirmed he will be able to come to [facility] this upcoming Wednesday, 9/1. 30 day review completed. ITP sent to Dr. Emily Filbert, Medical Director of Cardiac and Pulmonary Rehab. Continue with ITP unless changes are made by physician.            Comments: 30 day review

## 2020-08-08 ENCOUNTER — Encounter: Payer: Medicare Other | Admitting: *Deleted

## 2020-08-08 ENCOUNTER — Other Ambulatory Visit: Payer: Self-pay

## 2020-08-08 DIAGNOSIS — Z951 Presence of aortocoronary bypass graft: Secondary | ICD-10-CM

## 2020-08-08 NOTE — Progress Notes (Signed)
Daily Session Note  Patient Details  Name: Andre ROCHIN Sr. MRN: [id number] Date of Birth: 12/23/1940 Referring Provider:     Cardiac Rehab from 04/19/2020 in [facility] Cardiac and Pulmonary Rehab  Referring Provider [name]      Encounter Date: 08/08/2020  Check In:  Session Check In - 08/08/20 1530      Check-In   Supervising physician immediately available to respond to emergencies See telemetry face sheet for immediately available ER MD    Location [facility]-Cardiac & Pulmonary Rehab    Staff Present Renita Papa, RN Moises Blood, BS, ACSM CEP, Exercise Physiologist;Kara Eliezer Bottom, MS Exercise Physiologist    Virtual Visit No    Medication changes reported     No    Fall or balance concerns reported    No    Warm-up and Cool-down Performed on first and last piece of equipment    Resistance Training Performed Yes    VAD Patient? No    PAD/SET Patient? No      Pain Assessment   Currently in Pain? No/denies              Social History   Tobacco Use  Smoking Status Never Smoker  Smokeless Tobacco Never Used    Goals Met:  Independence with exercise equipment Exercise tolerated well No report of cardiac concerns or symptoms Strength training completed today  Goals Unmet:  Not Applicable  Comments: Pt able to follow exercise prescription today without complaint.  Will continue to monitor for progression.    Dr. Emily Filbert is Medical Director for [place] and [facility].

## 2020-08-10 ENCOUNTER — Encounter: Payer: Medicare Other | Admitting: *Deleted

## 2020-08-10 ENCOUNTER — Other Ambulatory Visit: Payer: Self-pay

## 2020-08-10 DIAGNOSIS — Z951 Presence of aortocoronary bypass graft: Secondary | ICD-10-CM

## 2020-08-10 NOTE — Progress Notes (Signed)
Daily Session Note  Patient Details  Name: Andre GOODCHILD Sr. MRN: [id number] Date of Birth: Feb 03, 1941 Referring Provider:     Cardiac Rehab from 04/19/2020 in [facility] Cardiac and Pulmonary Rehab  Referring Provider [name]      Encounter Date: 08/10/2020  Check In:  Session Check In - 08/10/20 1532      Check-In   Supervising physician immediately available to respond to emergencies See telemetry face sheet for immediately available ER MD    Location [facility]-Cardiac & Pulmonary Rehab    Staff Present Renita Papa, RN BSN;Joseph Lou Miner, [place] Exercise Physiologist    Virtual Visit No    Medication changes reported     No    Fall or balance concerns reported    No    Warm-up and Cool-down Performed on first and last piece of equipment    Resistance Training Performed Yes    VAD Patient? No    PAD/SET Patient? No      Pain Assessment   Currently in Pain? No/denies              Social History   Tobacco Use  Smoking Status Never Smoker  Smokeless Tobacco Never Used    Goals Met:  Independence with exercise equipment Exercise tolerated well No report of cardiac concerns or symptoms Strength training completed today  Goals Unmet:  Not Applicable  Comments: Pt able to follow exercise prescription today without complaint.  Will continue to monitor for progression.    Dr. Emily Filbert is Medical Director for [facility] and [facility].

## 2020-08-11 ENCOUNTER — Other Ambulatory Visit: Payer: Self-pay

## 2020-08-11 ENCOUNTER — Encounter: Payer: Medicare Other | Admitting: *Deleted

## 2020-08-11 ENCOUNTER — Other Ambulatory Visit (INDEPENDENT_AMBULATORY_CARE_PROVIDER_SITE_OTHER): Payer: Self-pay | Admitting: Vascular Surgery

## 2020-08-11 DIAGNOSIS — Z951 Presence of aortocoronary bypass graft: Secondary | ICD-10-CM | POA: Diagnosis not present

## 2020-08-11 DIAGNOSIS — I6523 Occlusion and stenosis of bilateral carotid arteries: Secondary | ICD-10-CM

## 2020-08-11 NOTE — Progress Notes (Signed)
Daily Session Note  Patient Details  Name: Andre GINGERICH Sr. MRN: [id number] Date of Birth: August 19, 1941 Referring Provider:     Cardiac Rehab from 04/19/2020 in [facility] Cardiac and Pulmonary Rehab  Referring Provider [name]      Encounter Date: 08/11/2020  Check In:  Session Check In - 08/11/20 1544      Check-In   Supervising physician immediately available to respond to emergencies See telemetry face sheet for immediately available ER MD    Location [facility]-Cardiac & Pulmonary Rehab    Staff Present Renita Papa, RN BSN;Joseph [address] [place], [place], Jette, CCRP, CCET    Virtual Visit No    Medication changes reported     No    Fall or balance concerns reported    No    Warm-up and Cool-down Performed on first and last piece of equipment    Resistance Training Performed Yes    VAD Patient? No    PAD/SET Patient? No      Pain Assessment   Currently in Pain? No/denies              Social History   Tobacco Use  Smoking Status Never Smoker  Smokeless Tobacco Never Used    Goals Met:  Independence with exercise equipment Exercise tolerated well No report of cardiac concerns or symptoms Strength training completed today  Goals Unmet:  Not Applicable  Comments: Pt able to follow exercise prescription today without complaint.  Will continue to monitor for progression.    Dr. Emily Filbert is Medical Director for [facility] and [facility].

## 2020-08-16 ENCOUNTER — Encounter (INDEPENDENT_AMBULATORY_CARE_PROVIDER_SITE_OTHER): Payer: Medicare Other

## 2020-08-16 ENCOUNTER — Ambulatory Visit (INDEPENDENT_AMBULATORY_CARE_PROVIDER_SITE_OTHER): Payer: Medicare Other | Admitting: Vascular Surgery

## 2020-08-17 ENCOUNTER — Encounter: Payer: Medicare Other | Admitting: *Deleted

## 2020-08-17 ENCOUNTER — Other Ambulatory Visit: Payer: Self-pay

## 2020-08-17 DIAGNOSIS — Z951 Presence of aortocoronary bypass graft: Secondary | ICD-10-CM | POA: Diagnosis not present

## 2020-08-17 NOTE — Progress Notes (Signed)
Daily Session Note  Patient Details  Name: Andre NEPHEW Sr. MRN: [id number] Date of Birth: October 10, 1941 Referring Provider:     Cardiac Rehab from 04/19/2020 in [facility] Cardiac and Pulmonary Rehab  Referring Provider [name]      Encounter Date: 08/17/2020  Check In:  Session Check In - 08/17/20 1542      Check-In   Supervising physician immediately available to respond to emergencies See telemetry face sheet for immediately available ER MD    Location [facility]-Cardiac & Pulmonary Rehab    Staff Present Renita Papa, RN Margurite Auerbach, MS Exercise Physiologist;Joseph Flavia Shipper    Virtual Visit No    Medication changes reported     No    Fall or balance concerns reported    No    Warm-up and Cool-down Performed on first and last piece of equipment    Resistance Training Performed Yes    VAD Patient? No    PAD/SET Patient? No      Pain Assessment   Currently in Pain? No/denies              Social History   Tobacco Use  Smoking Status Never Smoker  Smokeless Tobacco Never Used    Goals Met:  Independence with exercise equipment Exercise tolerated well No report of cardiac concerns or symptoms Strength training completed today  Goals Unmet:  Not Applicable  Comments: Pt able to follow exercise prescription today without complaint.  Will continue to monitor for progression.    Dr. Emily Filbert is Medical Director for [place] and [facility].

## 2020-08-23 NOTE — Patient Instructions (Signed)
Discharge Patient Instructions  Patient Details  Name: Andre FAULCON Sr. MRN: [id number] Date of Birth: 1941-07-05 Referring Provider:  Corey Skains, MD   Number of Visits: 36  Reason for Discharge:  Patient reached a stable level of exercise. Patient independent in their exercise. Patient has met program and personal goals.  Smoking History:  Social History   Tobacco Use  Smoking Status Never Smoker  Smokeless Tobacco Never Used    Diagnosis:  S/P CABG x 3  Initial Exercise Prescription:  Initial Exercise Prescription - 04/19/20 1500      Date of Initial Exercise RX and Referring Provider   Date 04/19/20    Referring Provider [name]      Treadmill   MPH 1.7    Grade 0    Minutes 15    METs 2.3      NuStep   Level 1    SPM 80    Minutes 15    METs 2      REL-XR   Level 1    Speed 50    Minutes 15    METs 2      Prescription Details   Frequency (times per week) 3    Duration Progress to 30 minutes of continuous aerobic without signs/symptoms of physical distress      Intensity   THRR 40-80% of Max Heartrate 102-128    Ratings of Perceived Exertion 11-13    Perceived Dyspnea 0-4      Resistance Training   Training Prescription Yes    Weight 3 lb    Reps 10-15           Discharge Exercise Prescription (Final Exercise Prescription Changes):  Exercise Prescription Changes - 08/22/20 1700      Response to Exercise   Blood Pressure (Admit) 132/60    Blood Pressure (Exercise) 138/62    Blood Pressure (Exit) 154/76    Heart Rate (Admit) 85 bpm    Heart Rate (Exercise) 111 bpm    Heart Rate (Exit) 82 bpm    Rating of Perceived Exertion (Exercise) 11    Symptoms none    Duration Continue with 30 min of aerobic exercise without signs/symptoms of physical distress.    Intensity THRR unchanged      Progression   Progression Continue to progress workloads to maintain intensity without signs/symptoms of physical distress.    Average METs  2.7      Resistance Training   Training Prescription Yes    Weight 3 lb    Reps 10-15      Treadmill   MPH 2.5    Grade 1.5    Minutes 15    METs 3.5      Recumbant Elliptical   Level 4    Minutes 15    METs 1.8           Functional Capacity:  6 Minute Walk    Row Name 04/19/20 1457 08/10/20 1654       6 Minute Walk   Phase Initial Discharge    Distance 890 feet 1375 feet    Distance % Change -- 54 %    Distance Feet Change -- 485 ft    Walk Time 6 minutes 6 minutes    # of Rest Breaks 0 0    MPH 1.7 2.6    METS 1.89 3.3    RPE 8 9    Perceived Dyspnea  0 0    VO2 Peak 6.6 11.58  Symptoms Yes (comment) No    Comments L leg sore where they took vein out --    Resting HR 75 bpm 77 bpm    Resting BP 102/52 156/76    Resting Oxygen Saturation  98 % --    Exercise Oxygen Saturation  during 6 min walk 98 % --    Max Ex. HR 95 bpm 105 bpm    Max Ex. BP 126/60 194/82    2 Minute Post BP 106/58 --           Quality of Life:  Quality of Life - 04/19/20 1515      Quality of Life   Select Quality of Life      Quality of Life Scores   Health/Function Pre 18.8 %    Socioeconomic Pre 29 %    Psych/Spiritual Pre 28.29 %    Family Pre 18 %    GLOBAL Pre 22.25 %           Personal Goals: Goals established at orientation with interventions provided to work toward goal.  Personal Goals and Risk Factors at Admission - 04/19/20 1519      Core Components/Risk Factors/Patient Goals on Admission    Weight Management Yes;Weight Maintenance    Intervention Weight Management: Develop a combined nutrition and exercise program designed to reach desired caloric intake, while maintaining appropriate intake of nutrient and fiber, sodium and fats, and appropriate energy expenditure required for the weight goal.;Weight Management: Provide education and appropriate resources to help participant work on and attain dietary goals.    Admit Weight 168 lb 12.8 oz (76.6 kg)     Goal Weight: Short Term 165 lb (74.8 kg)    Goal Weight: Long Term 165 lb (74.8 kg)    Expected Outcomes Short Term: Continue to assess and modify interventions until short term weight is achieved;Long Term: Adherence to nutrition and physical activity/exercise program aimed toward attainment of established weight goal;Weight Maintenance: Understanding of the daily nutrition guidelines, which includes 25-35% calories from fat, 7% or less cal from saturated fats, less than 294m cholesterol, less than 1.5gm of sodium, & 5 or more servings of fruits and vegetables daily    Intervention Provide education on lifestyle modifcations including regular physical activity/exercise, weight management, moderate sodium restriction and increased consumption of fresh fruit, vegetables, and low fat dairy, alcohol moderation, and smoking cessation.;Monitor prescription use compliance.    Expected Outcomes Short Term: Continued assessment and intervention until BP is < 140/92mHG in hypertensive participants. < 130/8049mG in hypertensive participants with diabetes, heart failure or chronic kidney disease.;Long Term: Maintenance of blood pressure at goal levels.    Intervention Provide education and support for participant on nutrition & aerobic/resistive exercise along with prescribed medications to achieve LDL <31m89mDL >40mg44m Expected Outcomes Short Term: Participant states understanding of desired cholesterol values and is compliant with medications prescribed. Participant is following exercise prescription and nutrition guidelines.;Long Term: Cholesterol controlled with medications as prescribed, with individualized exercise RX and with personalized nutrition plan. Value goals: LDL < 31mg,39m > 40 mg.            Personal Goals Discharge:  Goals and Risk Factor Review - 08/17/20 1642      Core Components/Risk Factors/Patient Goals Review   Personal Goals Review Weight Management/Obesity;Lipids;Hypertension     Review Andre Murphy next week [facility]  He feels the best  of the program is the staff!    Expected Outcomes Short: complete HT Long:  maintain exercise on his own           Exercise Goals and Review:  Exercise Goals    Row Name 04/19/20 1510             Exercise Goals   Increase Physical Activity Yes       Intervention Provide advice, education, support and counseling about physical activity/exercise needs.;Develop an individualized exercise prescription for aerobic and resistive training based on initial evaluation findings, risk stratification, comorbidities and participant's personal goals.       Expected Outcomes Short Term: Attend rehab on a regular basis to increase amount of physical activity.;Long Term: Add in home exercise to make exercise part of routine and to increase amount of physical activity.;Long Term: Exercising regularly at least 3-5 days a week.       Increase Strength and Stamina Yes       Intervention Provide advice, education, support and counseling about physical activity/exercise needs.;Develop an individualized exercise prescription for aerobic and resistive training based on initial evaluation findings, risk stratification, comorbidities and participant's personal goals.       Expected Outcomes Short Term: Increase workloads from initial exercise prescription for resistance, speed, and METs.;Short Term: Perform resistance training exercises routinely during rehab and add in resistance training at home;Long Term: Improve cardiorespiratory fitness, muscular endurance and strength as measured by increased METs and functional capacity (6MWT)       Able to understand and use rate of perceived exertion (RPE) scale Yes       Intervention Provide education and explanation on how to use RPE scale       Expected Outcomes Short Term: Able to use RPE daily in rehab to express subjective intensity level;Long Term:   Able to use RPE to guide intensity level when exercising independently       Knowledge and understanding of Target Heart Rate Range (THRR) Yes       Intervention Provide education and explanation of THRR including how the numbers were predicted and where they are located for reference       Expected Outcomes Short Term: Able to state/look up THRR;Short Term: Able to use daily as guideline for intensity in rehab;Long Term: Able to use THRR to govern intensity when exercising independently       Able to check pulse independently Yes       Intervention Provide education and demonstration on how to check pulse in carotid and radial arteries.;Review the importance of being able to check your own pulse for safety during independent exercise       Expected Outcomes Short Term: Able to explain why pulse checking is important during independent exercise;Long Term: Able to check pulse independently and accurately       Understanding of Exercise Prescription Yes       Intervention Provide education, explanation, and written materials on patient's individual exercise prescription       Expected Outcomes Short Term: Able to explain program exercise prescription;Long Term: Able to explain home exercise prescription to exercise independently              Exercise Goals Re-Evaluation:  Exercise Goals Re-Evaluation    Row Name 04/27/20 1508 05/05/20 1302 05/16/20 1445 05/23/20 1511 06/01/20 1443     Exercise Goal Re-Evaluation   Exercise Goals Review Increase Physical Activity;Able to understand and use rate of perceived exertion (RPE) scale;Knowledge and understanding of Target  Heart Rate Range (THRR);Understanding of Exercise Prescription;Increase Strength and Stamina;Able to check pulse independently Increase Physical Activity;Increase Strength and Stamina;Understanding of Exercise Prescription Increase Physical Activity;Increase Strength and Stamina;Understanding of Exercise Prescription Increase Physical  Activity;Increase Strength and Stamina;Understanding of Exercise Prescription Increase Physical Activity;Increase Strength and Stamina;Understanding of Exercise Prescription   Comments Reviewed RPE and dyspnea scales, THR and program prescription with pt today.  Pt voiced understanding and was given a copy of goals to take home. Andre Murphy is off to a good start in rehab.  He is already up over 2 METs overall even with only 3 full day sessions. We will continue to montior his progress. Andre Murphy continues to do well in rehab.  He is up to 16 watts on the bike.  We will continue to monitor his progress. Andre Murphy walks at home since his driveway is over a quarter mile long. Informed him that needs to get his hear rate up when he walks are home in between rehab days. Patient also needs to go over home exercise with EP. Andre Murphy has increased speed and grade on TM.  Staff will encourage him to increase to 4 lb for strength work.   Expected Outcomes Short: Use RPE daily to regulate intensity. Long: Follow program prescription in THR. Short: Continue to attend regularly Long; Continue to follow program prescription. Short: Add incline to treadmill Long: Continue to improve stamina. Short: add two days of walking at home. Go over home exercise with EP Long: maintain an exercise routine post [facility] independently. Short: increase weight for strength work Long:  build overall stamina   Row Name 06/13/20 1338 06/29/20 1616 06/30/20 0904 07/12/20 1325 07/28/20 0827     Exercise Goal Re-Evaluation   Exercise Goals Review Increase Physical Activity;Increase Strength and Stamina;Understanding of Exercise Prescription Increase Physical Activity;Increase Strength and Stamina Increase Physical Activity;Increase Strength and Stamina Increase Physical Activity;Increase Strength and Stamina;Understanding of Exercise Prescription Increase Physical Activity;Increase Strength and Stamina;Understanding of Exercise Prescription   Comments  Andre Murphy continues to do well in rehab.  He is now up to 4.5% grade on the treadmill.  We will continue to encourage him to increase his weights and monitor his progress. Andre Murphy states that when he is done with rehab he wants to join the [facility]. He has not been exercising at home since it has been to hot out. [facility]. Informed him that he can walk outside when it is cool out. -- Andre Murphy is doing well in rehab.  He is nearing graduation and we should see an improvement in his post 6MWT.  He is now up to level 4 on the recumbent elliptical.  We will continue to monitor his progress. Andre Murphy has only attended once since last review.   He has increased TM to 2/3.5 %.  He is due for post 6MWT   Expected Outcomes Short: Increase hand weights Long: Continue to improve stamina. Short: start to exercise at home again. Long: maintain and exercise routine at home and go to [facility]. -- Short: Improve post 6MWT Long: Continue to improve stamina Short: complete HT program Long:  maintain fitness on his own   Andre Murphy Name 07/28/20 1532 08/09/20 1544 08/22/20 1742         Exercise Goal Re-Evaluation   Exercise Goals Review Increase Physical Activity;Increase Strength and Stamina;Understanding of Exercise Prescription Increase Physical Activity;Increase Strength and Stamina;Understanding of Exercise Prescription Increase Physical Activity;Increase Strength and Stamina;Understanding of Exercise Prescription  Comments Andre Murphy is back from getting his house at [employer] ready for sale. He is doing well and feeling better overall.  His stamina has picked up and he feels good. Andre Murphy has only attended once since last review.  He has requested to skip education to get extra exercise sessions.  He is nearing graduation and should improve his post 6MWT.  We will continue to monitor his progress. Andre Murphy improved his walk test by 485 feet !  He will graduate this week     Expected Outcomes Short:  Continue to attend regularly Long: Continue to improve stamina. Short: Improve post 6MWT Long: Continue to exercise independently Short: complete HT program Long: maintain exercise on his own            Nutrition & Weight - Outcomes:  Pre Biometrics - 04/19/20 1514      Pre Biometrics   Height 5' 9.5" (1.765 m)    Weight 168 lb 12.8 oz (76.6 kg)    BMI (Calculated) 24.58    Single Leg Stand 22.41 seconds            Nutrition:  Nutrition Therapy & Goals - 05/02/20 1606      Nutrition Therapy   Diet Low Na, heart healthy    Protein (specify units) 60-65g    Fiber 30 grams    Whole Grain Foods 3 servings    Saturated Fats 12 max. grams    Fruits and Vegetables 5 servings/day    Sodium 1.5 grams      Personal Nutrition Goals   Nutrition Goal LT: increase strength, get back to activities    Comments cheerios (loves frosted flakes) with a banana or sometimes eggs and bacon with toast and jam and some orange juice. L: sandwich (chicken slalad or ham and [place]) - whole wheat. Discussed heart healthy eating.      Intervention Plan   Intervention Prescribe, educate and counsel regarding individualized specific dietary modifications aiming towards targeted core components such as weight, hypertension, lipid management, diabetes, heart failure and other comorbidities.;Nutrition handout(s) given to patient.    Expected Outcomes Short Term Goal: Understand basic principles of dietary content, such as calories, fat, sodium, cholesterol and nutrients.;Short Term Goal: A plan has been developed with personal nutrition goals set during dietitian appointment.;Long Term Goal: Adherence to prescribed nutrition plan.           Nutrition Discharge:  Nutrition Assessments - 04/19/20 1517      MEDFICTS Scores   Pre Score 19           Education Questionnaire Score:  Knowledge Questionnaire Score - 04/19/20 1514      Knowledge Questionnaire Score   Pre Score 21/26  angina/nutrition  /exercise           Goals reviewed with patient; copy given to patient.

## 2020-08-24 ENCOUNTER — Encounter: Payer: Medicare Other | Admitting: *Deleted

## 2020-08-24 ENCOUNTER — Other Ambulatory Visit: Payer: Self-pay

## 2020-08-24 DIAGNOSIS — Z951 Presence of aortocoronary bypass graft: Secondary | ICD-10-CM

## 2020-08-24 NOTE — Progress Notes (Signed)
Daily Session Note  Patient Details  Name: Andre Murphy. MRN: [id number] Date of Birth: 09/14/1941 Referring Provider:     Cardiac Rehab from 04/19/2020 in [facility]  Referring Provider Kowalski      Encounter Date: 08/24/2020  Check In:  Session Check In - 08/24/20 1546      Check-In   Supervising physician immediately available to respond to emergencies See telemetry face sheet for immediately available ER MD    Location [facility]    Staff Present Meredith Craven, RN BSN;Joseph Hood RCP,RRT,BSRT;Kara Langdon, MS Exercise Physiologist;Amanda Sommer, BA, ACSM CEP, Exercise Physiologist    Virtual Visit No    Medication changes reported     No    Fall or balance concerns reported    No    Warm-up and Cool-down Performed on first and last piece of equipment    Resistance Training Performed Yes    VAD Patient? No    PAD/SET Patient? No      Pain Assessment   Currently in Pain? No/denies              Social History   Tobacco Use  Smoking Status Never Smoker  Smokeless Tobacco Never Used    Goals Met:  Independence with exercise equipment Exercise tolerated well No report of cardiac concerns or symptoms Strength training completed today  Goals Unmet:  Not Applicable  Comments: Pt able to follow exercise prescription today without complaint.  Will continue to monitor for progression.    Dr. Mark Miller is Medical Director for [facility] and [facility]. 

## 2020-08-25 ENCOUNTER — Other Ambulatory Visit: Payer: Self-pay

## 2020-08-25 ENCOUNTER — Encounter: Payer: Medicare Other | Admitting: *Deleted

## 2020-08-25 DIAGNOSIS — Z951 Presence of aortocoronary bypass graft: Secondary | ICD-10-CM | POA: Diagnosis not present

## 2020-08-25 NOTE — Progress Notes (Signed)
Discharge Progress Report  Patient Details  Name: Andre TANNEY Sr. MRN: [id number] Date of Birth: 29-Jul-1941 Referring Provider:     Cardiac Rehab from 04/19/2020 in [facility]  Referring Provider [name]       Number of Visits: 35  Reason for Discharge:  Patient reached a stable level of exercise. Patient independent in their exercise. Patient has met program and personal goals.  Smoking History:  Social History   Tobacco Use  Smoking Status Never Smoker  Smokeless Tobacco Never Used    Diagnosis:  S/P CABG x 3  ADL UCSD:   Initial Exercise Prescription:  Initial Exercise Prescription - 04/19/20 1500      Date of Initial Exercise RX and Referring Provider   Date 04/19/20    Referring Provider [name]      Treadmill   MPH 1.7    Grade 0    Minutes 15    METs 2.3      NuStep   Level 1    SPM 80    Minutes 15    METs 2      REL-XR   Level 1    Speed 50    Minutes 15    METs 2      Prescription Details   Frequency (times per week) 3    Duration Progress to 30 minutes of continuous aerobic without signs/symptoms of physical distress      Intensity   THRR 40-80% of Max Heartrate 102-128    Ratings of Perceived Exertion 11-13    Perceived Dyspnea 0-4      Resistance Training   Training Prescription Yes    Weight 3 lb    Reps 10-15           Discharge Exercise Prescription (Final Exercise Prescription Changes):  Exercise Prescription Changes - 08/22/20 1700      Response to Exercise   Blood Pressure (Admit) 132/60    Blood Pressure (Exercise) 138/62    Blood Pressure (Exit) 154/76    Heart Rate (Admit) 85 bpm    Heart Rate (Exercise) 111 bpm    Heart Rate (Exit) 82 bpm    Rating of Perceived Exertion (Exercise) 11    Symptoms none    Duration Continue with 30 min of aerobic exercise without signs/symptoms of physical distress.    Intensity THRR unchanged      Progression   Progression Continue to progress  workloads to maintain intensity without signs/symptoms of physical distress.    Average METs 2.7      Resistance Training   Training Prescription Yes    Weight 3 lb    Reps 10-15      Treadmill   MPH 2.5    Grade 1.5    Minutes 15    METs 3.5      Recumbant Elliptical   Level 4    Minutes 15    METs 1.8           Functional Capacity:  6 Minute Walk    Row Name 04/19/20 1457 08/10/20 1654       6 Minute Walk   Phase Initial Discharge    Distance 890 feet 1375 feet    Distance % Change -- 54 %    Distance Feet Change -- 485 ft    Walk Time 6 minutes 6 minutes    # of Rest Breaks 0 0    MPH 1.7 2.6    METS 1.89 3.3  RPE 8 9    Perceived Dyspnea  0 0    VO2 Peak 6.6 11.58    Symptoms Yes (comment) No    Comments L leg sore where they took vein out --    Resting HR 75 bpm 77 bpm    Resting BP 102/52 156/76    Resting Oxygen Saturation  98 % --    Exercise Oxygen Saturation  during 6 min walk 98 % --    Max Ex. HR 95 bpm 105 bpm    Max Ex. BP 126/60 194/82    2 Minute Post BP 106/58 --           Psychological, QOL, Others - Outcomes: PHQ 2/9: Depression screen [facility] 2/9 08/24/2020 06/29/2020 05/23/2020 04/19/2020  Decreased Interest 0 0 2 0  Down, Depressed, Hopeless 0 0 2 1  PHQ - 2 Score 0 0 4 1  Altered sleeping 0 0 0 1  Tired, decreased energy 0 0 0 1  Change in appetite 0 0 0 1  Feeling bad or failure about yourself  0 1 1 1   Trouble concentrating 0 0 0 0  Moving slowly or fidgety/restless 0 0 0 0  Suicidal thoughts 0 0 0 0  PHQ-9 Score 0 1 5 5   Difficult doing work/chores - Not difficult at all Somewhat difficult Somewhat difficult    Quality of Life:  Quality of Life - 08/24/20 1547      Quality of Life   Select Quality of Life      Quality of Life Scores   Health/Function Pre 18.8 %    Health/Function Post 25.6 %    Health/Function % Change 36.17 %    Socioeconomic Pre 29 %    Socioeconomic Post 30 %    Socioeconomic % Change  3.45 %     Psych/Spiritual Pre 28.29 %    Psych/Spiritual Post 30 %    Psych/Spiritual % Change 6.04 %    Family Pre 18 %    Family Post 24 %    Family % Change 33.33 %    GLOBAL Pre 22.25 %    GLOBAL Post 27.26 %    GLOBAL % Change 22.52 %          Nutrition & Weight - Outcomes:  Pre Biometrics - 04/19/20 1514      Pre Biometrics   Height 5' 9.5" (1.765 m)    Weight 168 lb 12.8 oz (76.6 kg)    BMI (Calculated) 24.58    Single Leg Stand 22.41 seconds            Nutrition:  Nutrition Therapy & Goals - 05/02/20 1606      Nutrition Therapy   Diet Low Na, heart healthy    Protein (specify units) 60-65g    Fiber 30 grams    Whole Grain Foods 3 servings    Saturated Fats 12 max. grams    Fruits and Vegetables 5 servings/day    Sodium 1.5 grams      Personal Nutrition Goals   Nutrition Goal LT: increase strength, get back to activities    Comments cheerios (loves frosted flakes) with a banana or sometimes eggs and bacon with toast and jam and some orange juice. L: sandwich (chicken slalad or ham and [place]) - whole wheat. Discussed heart healthy eating.      Intervention Plan   Intervention Prescribe, educate and counsel regarding individualized specific dietary modifications aiming towards targeted core components such as weight, hypertension,  lipid management, diabetes, heart failure and other comorbidities.;Nutrition handout(s) given to patient.    Expected Outcomes Short Term Goal: Understand basic principles of dietary content, such as calories, fat, sodium, cholesterol and nutrients.;Short Term Goal: A plan has been developed with personal nutrition goals set during dietitian appointment.;Long Term Goal: Adherence to prescribed nutrition plan.           Nutrition Discharge:  Nutrition Assessments - 08/24/20 1548      MEDFICTS Scores   Post Score 12           Education Questionnaire Score:  Knowledge Questionnaire Score - 08/24/20 1547      Knowledge Questionnaire  Score   Post Score 23/26           Goals reviewed with patient; copy given to patient.

## 2020-08-25 NOTE — Progress Notes (Signed)
Cardiac Individual Treatment Plan  Patient Details  Name: Andre FANCHER Sr. MRN: [id number] Date of Birth: Apr 09, 1941 Referring Provider:     Cardiac Rehab from 04/19/2020 in [facility] Cardiac and Pulmonary Rehab  Referring Provider [name]      Initial Encounter Date:    Cardiac Rehab from 04/19/2020 in [facility] Cardiac and Pulmonary Rehab  Date 04/19/20      Visit Diagnosis: S/P CABG x 3  Patient's Home Medications on Admission:  Current Outpatient Medications:  .  Acetaminophen 325 MG CAPS, Take by mouth., Disp: , Rfl:  .  amiodarone (PACERONE) 200 MG tablet, Take 400 mg by mouth three times daily through 4/29.  On, 4/30 reduce to 220m by mouth daily, Disp: , Rfl:  .  aspirin EC 81 MG tablet, Take 81 mg by mouth daily., Disp: , Rfl:  .  atorvastatin (LIPITOR) 40 MG tablet, Take by mouth., Disp: , Rfl:  .  carboxymethylcellulose (REFRESH PLUS) 0.5 % SOLN, Apply to eye., Disp: , Rfl:  .  furosemide (LASIX) 40 MG tablet, Take 1 tablet (40 mg total) by mouth daily., Disp: 30 tablet, Rfl: 0 .  isosorbide mononitrate (IMDUR) 30 MG 24 hr tablet, Take 1 tablet (30 mg total) by mouth daily. (Patient not taking: Reported on 04/15/2020), Disp: 30 tablet, Rfl: 0 .  lisinopril (ZESTRIL) 5 MG tablet, Take 1 tablet (5 mg total) by mouth daily., Disp: 30 tablet, Rfl: 0 .  metoprolol tartrate (LOPRESSOR) 50 MG tablet, Take 1 tablet (50 mg total) by mouth 2 (two) times daily., Disp: 60 tablet, Rfl: 0 .  spironolactone (ALDACTONE) 25 MG tablet, Take 0.5 tablets (12.5 mg total) by mouth daily. (Patient not taking: Reported on 04/15/2020), Disp: 15 tablet, Rfl: 0 .  temazepam (RESTORIL) 15 MG capsule, Take 15 mg by mouth at bedtime as needed., Disp: , Rfl:  .  vitamin B-12 (CYANOCOBALAMIN) 250 MCG tablet, Take 250 mcg by mouth as needed., Disp: , Rfl:   Past Medical History: Past Medical History:  Diagnosis Date  . Bursitis   . Elevated lipids   . Hypertension   . SOB (shortness of breath)     Tobacco  Use: Social History   Tobacco Use  Smoking Status Never Smoker  Smokeless Tobacco Never Used    Labs: Recent Review [employer], physiological   Labs for ITP Cardiac and Pulmonary Rehab Latest Ref Rng & Units 03/06/2020   Cholestrol 0 - 200 mg/dL 144   LDLCALC 0 - 99 mg/dL 86   HDL >40 mg/dL 46   Trlycerides <150 mg/dL 62   Hemoglobin A1c 4.8 - 5.6 % 5.4       Exercise Target Goals: Exercise Program Goal: Individual exercise prescription set using results from initial 6 min walk test and THRR while considering  patient's activity barriers and safety.   Exercise Prescription Goal: Initial exercise prescription builds to 30-45 minutes a day of aerobic activity, 2-3 days per week.  Home exercise guidelines will be given to patient during program as part of exercise prescription that the participant will acknowledge.   Education: Aerobic Exercise & Resistance Training: - Gives group verbal and written instruction on the various components of exercise. Focuses on aerobic and resistive training programs and the benefits of this training and how to safely progress through these programs..   Education: Exercise & Equipment Safety: - Individual verbal instruction and demonstration of equipment use and safety with use of the equipment.   [facility]  Date 04/19/20  Educator AS  Instruction Review Code 1- Verbalizes Understanding      Education: Exercise Physiology & General Exercise Guidelines: - Group verbal and written instruction with models to review the exercise physiology of the cardiovascular system and associated critical values. Provides general exercise guidelines with specific guidelines to those with heart or lung disease.    Education: Flexibility, Balance, Mind/Body Relaxation: Provides group verbal/written instruction on the benefits of flexibility and balance training, including mind/body exercise modes such as yoga, pilates  and tai chi.  Demonstration and skill practice provided.   Activity Barriers & Risk Stratification:  Activity Barriers & Cardiac Risk Stratification - 04/15/20 1312      Activity Barriers & Cardiac Risk Stratification   Activity Barriers Back Problems   messed up lower lumbar region in high school   Cardiac Risk Stratification High           6 Minute Walk:  6 Minute Walk    Row Name 04/19/20 1457 08/10/20 1654       6 Minute Walk   Phase Initial Discharge    Distance 890 feet 1375 feet    Distance % Change -- 54 %    Distance Feet Change -- 485 ft    Walk Time 6 minutes 6 minutes    # of Rest Breaks 0 0    MPH 1.7 2.6    METS 1.89 3.3    RPE 8 9    Perceived Dyspnea  0 0    VO2 Peak 6.6 11.58    Symptoms Yes (comment) No    Comments L leg sore where they took vein out --    Resting HR 75 bpm 77 bpm    Resting BP 102/52 156/76    Resting Oxygen Saturation  98 % --    Exercise Oxygen Saturation  during 6 min walk 98 % --    Max Ex. HR 95 bpm 105 bpm    Max Ex. BP 126/60 194/82    2 Minute Post BP 106/58 --           Oxygen Initial Assessment:   Oxygen Re-Evaluation:   Oxygen Discharge (Final Oxygen Re-Evaluation):   Initial Exercise Prescription:  Initial Exercise Prescription - 04/19/20 1500      Date of Initial Exercise RX and Referring Provider   Date 04/19/20    Referring Provider [name]      Treadmill   MPH 1.7    Grade 0    Minutes 15    METs 2.3      NuStep   Level 1    SPM 80    Minutes 15    METs 2      REL-XR   Level 1    Speed 50    Minutes 15    METs 2      Prescription Details   Frequency (times per week) 3    Duration Progress to 30 minutes of continuous aerobic without signs/symptoms of physical distress      Intensity   THRR 40-80% of Max Heartrate 102-128    Ratings of Perceived Exertion 11-13    Perceived Dyspnea 0-4      Resistance Training   Training Prescription Yes    Weight 3 lb    Reps 10-15            Perform Capillary Blood Glucose checks as needed.  Exercise Prescription Changes:   Exercise Prescription Changes  [place] Name 04/19/20 1500 05/05/20 1300 05/16/20 1400 06/01/20 1400 06/13/20 1300     Response to Exercise   Blood Pressure (Admit) 102/52 132/70 108/60 134/66 120/62   Blood Pressure (Exercise) 126/60 158/80 146/70 140/80 138/62   Blood Pressure (Exit) 106/58 124/64 110/60 126/72 122/60   Heart Rate (Admit) 75 bpm 90 bpm 72 bpm 97 bpm 64 bpm   Heart Rate (Exercise) 95 bpm 123 bpm 96 bpm 121 bpm 107 bpm   Heart Rate (Exit) 87 bpm 102 bpm 82 bpm 94 bpm 80 bpm   Oxygen Saturation (Admit) 98 % -- -- -- --   Oxygen Saturation (Exercise) 98 % -- -- -- --   Rating of Perceived Exertion (Exercise) _0 Perceived Dyspnea (Exercise) 0 -- -- -- --   Symptoms L leg sore none none none none   Comments -- third full day of exercise.  -- -- --   Duration -- Continue with 30 min of aerobic exercise without signs/symptoms of physical distress. Continue with 30 min of aerobic exercise without signs/symptoms of physical distress. Continue with 30 min of aerobic exercise without signs/symptoms of physical distress. Continue with 30 min of aerobic exercise without signs/symptoms of physical distress.   Intensity -- THRR unchanged THRR unchanged THRR unchanged THRR unchanged     Progression   Progression -- Continue to progress workloads to maintain intensity without signs/symptoms of physical distress. Continue to progress workloads to maintain intensity without signs/symptoms of physical distress. Continue to progress workloads to maintain intensity without signs/symptoms of physical distress. Continue to progress workloads to maintain intensity without signs/symptoms of physical distress.   Average METs -- 2.25 2.5 3.5 3.15     Resistance Training   Training Prescription -- Yes Yes Yes Yes   Weight -- 3 lb 3 lb 3 lb 3 lb   Reps -- 10-15 10-15 10-15 10-15     Interval  Training   Interval Training -- No No No No     Treadmill   MPH -- 1.7 1.7 2.2 2.2   Grade -- 0 0 4.5 4.5   Minutes -- _1 METs -- 2.3 2.3 4.05 4.05     Recumbant Bike   Level -- -- 1 -- --   Watts -- -- 16 -- --   Minutes -- -- 15 -- --   METs -- -- 2.39 -- --     NuStep   Level -- 1 -- -- 2   Minutes -- 15 -- -- 15   METs -- 2.5 -- -- 2.4     REL-XR   Level -- 1 1 -- --   Minutes -- 15 15 -- --   METs -- 2.2 2.6 -- --     Biostep-RELP   Level -- 1 -- 3 3   SPM -- -- -- 50 --   Minutes -- 15 -- 15 15   METs -- 2 -- 3 3   Row Name 06/30/20 0900 07/12/20 1300 07/28/20 0800 07/28/20 1500 08/09/20 1500     Response to Exercise   Blood Pressure (Admit) 134/60 148/80 166/88 -- 136/84   Blood Pressure (Exercise) 128/70 162/82 142/68 -- 150/80   Blood Pressure (Exit) 130/70 138/80 128/70 -- 144/80   Heart Rate (Admit) 75 bpm 89 bpm 88 bpm -- 101 bpm   Heart Rate (Exercise) 118 bpm 105 bpm 106 bpm -- 114 bpm   Heart Rate (Exit) 78 bpm 88 bpm  91 bpm -- 84 bpm   Rating of Perceived Exertion (Exercise) _0 -- 12   Symptoms none none none -- none   Duration Continue with 30 min of aerobic exercise without signs/symptoms of physical distress. Continue with 30 min of aerobic exercise without signs/symptoms of physical distress. Continue with 30 min of aerobic exercise without signs/symptoms of physical distress. -- Continue with 30 min of aerobic exercise without signs/symptoms of physical distress.   Intensity THRR unchanged THRR unchanged THRR unchanged -- THRR unchanged     Progression   Progression Continue to progress workloads to maintain intensity without signs/symptoms of physical distress. Continue to progress workloads to maintain intensity without signs/symptoms of physical distress. Continue to progress workloads to maintain intensity without signs/symptoms of physical distress. -- Continue to progress workloads to maintain intensity without signs/symptoms of  physical distress.   Average METs 3.7 2.87 2.6 -- 3.5     Resistance Training   Training Prescription Yes Yes Yes -- Yes   Weight 3 lb 3 lb 3 lb -- 3 lb   Reps 10-15 10-15 10-15 -- 10-15     Interval Training   Interval Training No No No -- No     Treadmill   MPH 2.2 1.9 2 -- 2.2   Grade 4.5 3.5 3.5 -- 3.5   Minutes _1 -- 15   METs 4.05 3.5 3.5 -- 3.5     Recumbant Elliptical   Level -- 4 -- -- --   Minutes -- 15 -- -- --   METs -- 2 -- -- --     REL-XR   Level -- 3 4 -- 4   Speed -- -- 50 -- --   Minutes -- 15 15 -- 15   METs -- 3.1 1.6 -- --     Biostep-RELP   Level 3 -- -- -- --   SPM 50 -- -- -- --   Minutes 15 -- -- -- --   METs 3.3 -- -- -- --     Home Exercise Plan   Plans to continue exercise at -- -- -- [employer] (comment)  [employer] (comment)  [employer]   Frequency -- -- -- Add 2 additional days to program exercise sessions. Add 2 additional days to program exercise sessions.   Initial Home Exercises Provided -- -- -- 07/28/20 07/28/20   Row Name 08/22/20 1700             Response to Exercise   Blood Pressure (Admit) 132/60       Blood Pressure (Exercise) 138/62       Blood Pressure (Exit) 154/76       Heart Rate (Admit) 85 bpm       Heart Rate (Exercise) 111 bpm       Heart Rate (Exit) 82 bpm       Rating of Perceived Exertion (Exercise) 11       Symptoms none       Duration Continue with 30 min of aerobic exercise without signs/symptoms of physical distress.       Intensity THRR unchanged         Progression   Progression Continue to progress workloads to maintain intensity without signs/symptoms of physical distress.       Average METs 2.7         Resistance Training   Training Prescription Yes       Weight 3 lb       Reps 10-15  Treadmill   MPH 2.5       Grade 1.5       Minutes 15       METs 3.5         Recumbant Elliptical   Level 4       Minutes 15       METs 1.8              Exercise  Comments:   Exercise Goals and Review:   Exercise Goals    Row Name 04/19/20 1510             Exercise Goals   Increase Physical Activity Yes       Intervention Provide advice, education, support and counseling about physical activity/exercise needs.;Develop an individualized exercise prescription for aerobic and resistive training based on initial evaluation findings, risk stratification, comorbidities and participant's personal goals.       Expected Outcomes Short Term: Attend rehab on a regular basis to increase amount of physical activity.;Long Term: Add in home exercise to make exercise part of routine and to increase amount of physical activity.;Long Term: Exercising regularly at least 3-5 days a week.       Increase Strength and Stamina Yes       Intervention Provide advice, education, support and counseling about physical activity/exercise needs.;Develop an individualized exercise prescription for aerobic and resistive training based on initial evaluation findings, risk stratification, comorbidities and participant's personal goals.       Expected Outcomes Short Term: Increase workloads from initial exercise prescription for resistance, speed, and METs.;Short Term: Perform resistance training exercises routinely during rehab and add in resistance training at home;Long Term: Improve cardiorespiratory fitness, muscular endurance and strength as measured by increased METs and functional capacity ( )       Able to understand and use rate of perceived exertion (RPE) scale Yes       Intervention Provide education and explanation on how to use RPE scale       Expected Outcomes Short Term: Able to use RPE daily in rehab to express subjective intensity level;Long Term:  Able to use RPE to guide intensity level when exercising independently       Knowledge and understanding of Target Heart Rate Range (THRR) Yes       Intervention Provide education and explanation of THRR including how the  numbers were predicted and where they are located for reference       Expected Outcomes Short Term: Able to state/look up THRR;Short Term: Able to use daily as guideline for intensity in rehab;Long Term: Able to use THRR to govern intensity when exercising independently       Able to check pulse independently Yes       Intervention Provide education and demonstration on how to check pulse in carotid and radial arteries.;Review the importance of being able to check your own pulse for safety during independent exercise       Expected Outcomes Short Term: Able to explain why pulse checking is important during independent exercise;Long Term: Able to check pulse independently and accurately       Understanding of Exercise Prescription Yes       Intervention Provide education, explanation, and written materials on patient's individual exercise prescription       Expected Outcomes Short Term: Able to explain program exercise prescription;Long Term: Able to explain home exercise prescription to exercise independently              Exercise Goals Re-Evaluation :  Exercise Goals Re-Evaluation    Row Name 04/27/20 1508 05/05/20 1302 05/16/20 1445 05/23/20 1511 06/01/20 1443     Exercise Goal Re-Evaluation   Exercise Goals Review Increase Physical Activity;Able to understand and use rate of perceived exertion (RPE) scale;Knowledge and understanding of Target Heart Rate Range (THRR);Understanding of Exercise Prescription;Increase Strength and Stamina;Able to check pulse independently Increase Physical Activity;Increase Strength and Stamina;Understanding of Exercise Prescription Increase Physical Activity;Increase Strength and Stamina;Understanding of Exercise Prescription Increase Physical Activity;Increase Strength and Stamina;Understanding of Exercise Prescription Increase Physical Activity;Increase Strength and Stamina;Understanding of Exercise Prescription   Comments Reviewed RPE and dyspnea scales, THR and  program prescription with pt today.  Pt voiced understanding and was given a copy of goals to take home. Trevell is off to a good start in rehab.  He is already up over 2 METs overall even with only 3 full day sessions. We will continue to montior his progress. Jakarius continues to do well in rehab.  He is up to 16 watts on the bike.  We will continue to monitor his progress. Ronalee Belts walks at home since his driveway is over a quarter mile long. Informed him that needs to get his hear rate up when he walks are home in between rehab days. Patient also needs to go over home exercise with EP. Atif has increased speed and grade on TM.  Staff will encourage him to increase to 4 lb for strength work.   Expected Outcomes Short: Use RPE daily to regulate intensity. Long: Follow program prescription in THR. Short: Continue to attend regularly Long; Continue to follow program prescription. Short: Add incline to treadmill Long: Continue to improve stamina. Short: add two days of walking at home. Go over home exercise with EP Long: maintain an exercise routine post [facility] independently. Short: increase weight for strength work Long:  build overall stamina   Row Name 06/13/20 1338 06/29/20 1616 06/30/20 0904 07/12/20 1325 07/28/20 0827     Exercise Goal Re-Evaluation   Exercise Goals Review Increase Physical Activity;Increase Strength and Stamina;Understanding of Exercise Prescription Increase Physical Activity;Increase Strength and Stamina Increase Physical Activity;Increase Strength and Stamina Increase Physical Activity;Increase Strength and Stamina;Understanding of Exercise Prescription Increase Physical Activity;Increase Strength and Stamina;Understanding of Exercise Prescription   Comments Oscar continues to do well in rehab.  He is now up to 4.5% grade on the treadmill.  We will continue to encourage him to increase his weights and monitor his progress. Ronalee Belts states that when he is done with rehab he wants to  join the [facility]. He has not been exercising at home since it has been to hot out. [facility] Informed him that he can walk outside when it is cool out. -- Ronalee Belts is doing well in rehab.  He is nearing graduation and we should see an improvement in his post 6MWT.  He is now up to level 4 on the recumbent elliptical.  We will continue to monitor his progress. Ronalee Belts has only attended once since last review.   He has increased TM to 2/3.5 %.  He is due for post 6MWT   Expected Outcomes Short: Increase hand weights Long: Continue to improve stamina. Short: start to exercise at home again. Long: maintain and exercise routine at home and [facility]. -- Short: Improve post 6MWT Long: Continue to improve stamina Short: complete HT program Long:  maintain fitness on his own   [place] Name 07/28/20 [phone]  08/09/20 1544 08/22/20 1742         Exercise Goal Re-Evaluation   Exercise Goals Review Increase Physical Activity;Increase Strength and Stamina;Understanding of Exercise Prescription Increase Physical Activity;Increase Strength and Stamina;Understanding of Exercise Prescription Increase Physical Activity;Increase Strength and Stamina;Understanding of Exercise Prescription     Comments Ronalee Belts [place]. He is doing well and feeling better overall.  His stamina has picked up and he feels good. Ronalee Belts has only attended once since last review.  He has requested to skip education to get extra exercise sessions.  He is nearing graduation and should improve his post 6MWT.  We will continue to monitor his progress. Ronalee Belts improved his walk test by 485 feet !  He will graduate this week     Expected Outcomes Short: Continue to attend regularly Long: Continue to improve stamina. Short: Improve post 6MWT Long: Continue to exercise independently Short: complete HT program Long: maintain exercise on his own            Discharge  Exercise Prescription (Final Exercise Prescription Changes):  Exercise Prescription Changes - 08/22/20 1700      Response to Exercise   Blood Pressure (Admit) 132/60    Blood Pressure (Exercise) 138/62    Blood Pressure (Exit) 154/76    Heart Rate (Admit) 85 bpm    Heart Rate (Exercise) 111 bpm    Heart Rate (Exit) 82 bpm    Rating of Perceived Exertion (Exercise) 11    Symptoms none    Duration Continue with 30 min of aerobic exercise without signs/symptoms of physical distress.    Intensity THRR unchanged      Progression   Progression Continue to progress workloads to maintain intensity without signs/symptoms of physical distress.    Average METs 2.7      Resistance Training   Training Prescription Yes    Weight 3 lb    Reps 10-15      Treadmill   MPH 2.5    Grade 1.5    Minutes 15    METs 3.5      Recumbant Elliptical   Level 4    Minutes 15    METs 1.8           Nutrition:  Target Goals: Understanding of nutrition guidelines, daily intake of sodium '1500mg'$ , cholesterol '200mg'$ , calories 30% from fat and 7% or less from saturated fats, daily to have 5 or more servings of fruits and vegetables.  Education: Controlling Sodium/Reading Food Labels -Group verbal and written material supporting the discussion of sodium use in heart healthy nutrition. Review and explanation with models, verbal and written materials for utilization of the food label.   Education: General Nutrition Guidelines/Fats and Fiber: -Group instruction provided by verbal, written material, models and posters to present the general guidelines for heart healthy nutrition. Gives an explanation and review of dietary fats and fiber.   Biometrics:  Pre Biometrics - 04/19/20 1514      Pre Biometrics   Height 5' 9.5" (1.765 m)    Weight 168 lb 12.8 oz (76.6 kg)    BMI (Calculated) 24.58    Single Leg Stand 22.41 seconds            Nutrition Therapy Plan and Nutrition Goals:  Nutrition  Therapy & Goals - 05/02/20 1606      Nutrition Therapy   Diet Low Na, heart healthy    Protein (specify units) 60-65g    Fiber  30 grams    Whole Grain Foods 3 servings    Saturated Fats 12 max. grams    Fruits and Vegetables 5 servings/day    Sodium 1.5 grams      Personal Nutrition Goals   Nutrition Goal LT: increase strength, get back to activities    Comments cheerios (loves frosted flakes) with a banana or sometimes eggs and bacon with toast and jam and some orange juice. L: sandwich (chicken slalad or ham and [place]) - whole wheat. Discussed heart healthy eating.      Intervention Plan   Intervention Prescribe, educate and counsel regarding individualized specific dietary modifications aiming towards targeted core components such as weight, hypertension, lipid management, diabetes, heart failure and other comorbidities.;Nutrition handout(s) given to patient.    Expected Outcomes Short Term Goal: Understand basic principles of dietary content, such as calories, fat, sodium, cholesterol and nutrients.;Short Term Goal: A plan has been developed with personal nutrition goals set during dietitian appointment.;Long Term Goal: Adherence to prescribed nutrition plan.           Nutrition Assessments:  Nutrition Assessments - 08/24/20 1548      MEDFICTS Scores   Post Score 12           MEDIFICTS Score Key:          ?70 Need to make dietary changes          40-70 Heart Healthy Diet         ? 40 Therapeutic Level Cholesterol Diet  Nutrition Goals Re-Evaluation:  Nutrition Goals Re-Evaluation    [place] Name 07/27/20 1633 08/10/20 1559           Goals   Nutrition Goal LT: increase strength, get back to activities LT: increase strength, get back to activities      Comment Continue with current changes Continue with current changes. He would not like to make any changes at this time and doesn't have any questions or concerns before discharge.      Expected Outcome LT: increase  strength, get back to activities LT: increase strength, get back to activities             Nutrition Goals Discharge (Final Nutrition Goals Re-Evaluation):  Nutrition Goals Re-Evaluation - 08/10/20 1559      Goals   Nutrition Goal LT: increase strength, get back to activities    Comment Continue with current changes. He would not like to make any changes at this time and doesn't have any questions or concerns before discharge.    Expected Outcome LT: increase strength, get back to activities           Psychosocial: Target Goals: Acknowledge presence or absence of significant depression and/or stress, maximize coping skills, provide positive support system. Participant is able to verbalize types and ability to use techniques and skills needed for reducing stress and depression.   Education: Depression - Provides group verbal and written instruction on the correlation between heart/lung disease and depressed mood, treatment options, and the stigmas associated with seeking treatment.   Cardiac Rehab from 07/06/2020 in [facility] Cardiac and Pulmonary Rehab  Date 06/29/20  Educator AS  Instruction Review Code 1- Verbalizes Understanding      Education: Sleep Hygiene -Provides group verbal and written instruction about how sleep can affect your health.  Define sleep hygiene, discuss sleep cycles and impact of sleep habits. Review good sleep hygiene tips.     Education: Stress and Anxiety: - Provides group verbal and written instruction about  the health risks of elevated stress and causes of high stress.  Discuss the correlation between heart/lung disease and anxiety and treatment options. Review healthy ways to manage with stress and anxiety.   Cardiac Rehab from 07/06/2020 in [facility] Cardiac and Pulmonary Rehab  Date 06/29/20  Educator AS  Instruction Review Code 1- Verbalizes Understanding       Initial Review & Psychosocial Screening:  Initial Psych Review & Screening - 04/15/20 1305       Initial Review   Current issues with Current Stress Concerns      Family Dynamics   Good Support System? Yes      Barriers   Psychosocial barriers to participate in program There are no identifiable barriers or psychosocial needs.;The patient should benefit from training in stress management and relaxation.      Screening Interventions   Interventions Encouraged to exercise;To provide support and resources with identified psychosocial needs;Provide feedback about the scores to participant    Expected Outcomes Short Term goal: Utilizing psychosocial counselor, staff and physician to assist with identification of specific Stressors or current issues interfering with healing process. Setting desired goal for each stressor or current issue identified.;Long Term Goal: Stressors or current issues are controlled or eliminated.;Short Term goal: Identification and review with participant of any Quality of Life or Depression concerns found by scoring the questionnaire.;Long Term goal: The participant improves quality of Life and PHQ9 Scores as seen by post scores and/or verbalization of changes           Quality of Life Scores:   Quality of Life - 08/24/20 1547      Quality of Life   Select Quality of Life      Quality of Life Scores   Health/Function Pre 18.8 %    Health/Function Post 25.6 %    Health/Function % Change 36.17 %    Socioeconomic Pre 29 %    Socioeconomic Post 30 %    Socioeconomic % Change  3.45 %    Psych/Spiritual Pre 28.29 %    Psych/Spiritual Post 30 %    Psych/Spiritual % Change 6.04 %    Family Pre 18 %    Family Post 24 %    Family % Change 33.33 %    GLOBAL Pre 22.25 %    GLOBAL Post 27.26 %    GLOBAL % Change 22.52 %          Scores of 19 and below usually indicate a poorer quality of life in these areas.  A difference of  2-3 points is a clinically meaningful difference.  A difference of 2-3 points in the total score of the Quality of Life Index has  been associated with significant improvement in overall quality of life, self-image, physical symptoms, and general health in studies assessing change in quality of life.  PHQ-9: Recent Review Flowsheet Data    Depression screen [facility] 2/9 08/24/2020 06/29/2020 05/23/2020 04/19/2020   Decreased Interest 0 0 2 0   Down, Depressed, Hopeless 0 0 2 1   PHQ - 2 Score 0 0 4 1   Altered sleeping 0 0 0 1   Tired, decreased energy 0 0 0 1   Change in appetite 0 0 0 1   Feeling bad or failure about yourself  0 _0 Trouble concentrating 0 0 0 0   Moving slowly or fidgety/restless 0 0 0 0   Suicidal thoughts 0 0 0 0   PHQ-9 Score 0  _0 Difficult doing work/chores - Not difficult at all Somewhat difficult Somewhat difficult     Interpretation of Total Score  Total Score Depression Severity:  1-4 = Minimal depression, 5-9 = Mild depression, 10-14 = Moderate depression, 15-19 = Moderately severe depression, 20-27 = Severe depression   Psychosocial Evaluation and Intervention:  Psychosocial Evaluation - 04/15/20 1317      Psychosocial Evaluation & Interventions   Comments Ronalee Belts reports doing well, although this heart surgery came as a surprise to him. He said the symptoms hit him suddenly. He states he is feeling much better, but wants to get back to his "everyday stuff" (i.e. mowing, taking care of his properties). His wife and he are excited about him starting Cardiac Rehab and gaining a better understanding of heart healthy living.    Expected Outcomes Short: attend Cardiac Rehab for education and exercise. Long: maintain positive self care habits.    Continue Psychosocial Services  Follow up required by staff           Psychosocial Re-Evaluation:  Psychosocial Re-Evaluation    Urania Name 05/23/20 1522 06/29/20 1611 07/28/20 1537 08/17/20 1643       Psychosocial Re-Evaluation   Current issues with History of Depression None Identified None Identified --    Comments Reviewed patient health  questionnaire (PHQ-9) with patient for follow up. Previously, patients score indicated signs/symptoms of depression.  Reviewed to see if patient is improving symptom wise while in program.  Score stayed the same and patient states that it is because the loss of his dog last week. Reviewed patient health questionnaire (PHQ-9) with patient for follow up. Previously, patients score indicated signs/symptoms of depression.  Reviewed to see if patient is improving symptom wise while in program.  Score improved and patient states that it is because he has been able to come to rehab and exercise. Ronalee Belts is doing well mentally. He would like to be able to do more like he used to.  He is still missing the energy he had before.  He has gotten back [facility] the [facility]. He is sleeping ok.  He lost his dog right after is surgery, and still mourning her loss. [facility]alee Belts [facility] concerns about the [facility] due to [facility] so is considering the [employer].    Expected Outcomes Short: Continue to work toward an improvement in Maceo scores by attending [facility] regularly. Long: Continue to improve stress and depression coping skills by talking with staff and attending [facility] regularly and work toward a positive mental state. Short: Continue to attend [facility] regularly for regular exercise and social engagement. Long: Continue to improve symptoms and manage a positive mental state. Short: Finish up rehab  Long:  Continue to focus on positive. Short:  complete HT long: maintain positive outlook    Interventions Encouraged to attend Cardiac Rehabilitation for the exercise Encouraged to attend Cardiac Rehabilitation for the exercise Encouraged to attend Cardiac Rehabilitation for the exercise --    Continue Psychosocial Services  Follow up required by staff No Follow up required No Follow up required --           Psychosocial Discharge (Final Psychosocial Re-Evaluation):  Psychosocial Re-Evaluation - 08/17/20 1643      Psychosocial  Re-Evaluation   Comments Ronalee Belts has some concerns about the [facility] due [facility] so is considering the [employer].    Expected Outcomes Short:  complete HT long: maintain positive outlook           Vocational  Rehabilitation: Provide vocational rehab assistance to qualifying candidates.   Vocational Rehab Evaluation & Intervention:  Vocational Rehab - 04/15/20 1305      Initial Vocational Rehab Evaluation & Intervention   Assessment shows need for Vocational Rehabilitation No           Education: Education Goals: Education classes will be provided on a variety of topics geared toward better understanding of heart health and risk factor modification. Participant will state understanding/return demonstration of topics presented as noted by education test scores.  Learning Barriers/Preferences:  Learning Barriers/Preferences - 04/15/20 1305      Learning Barriers/Preferences   Learning Barriers None    Learning Preferences None           General Cardiac Education Topics:  AED/CPR: - Group verbal and written instruction with the use of models to demonstrate the basic use of the AED with the basic ABC's of resuscitation.   Anatomy & Physiology of the Heart: - Group verbal and written instruction and models provide basic cardiac anatomy and physiology, with the coronary electrical and arterial systems. Review of Valvular disease and Heart Failure   Cardiac Procedures: - Group verbal and written instruction to review commonly prescribed medications for heart disease. Reviews the medication, class of the drug, and side effects. Includes the steps to properly store meds and maintain the prescription regimen. (beta blockers and nitrates)   Cardiac Medications I: - Group verbal and written instruction to review commonly prescribed medications for heart disease. Reviews the medication, class of the drug, and side effects. Includes the steps to properly store meds and maintain the  prescription regimen.   Cardiac Medications II: -Group verbal and written instruction to review commonly prescribed medications for heart disease. Reviews the medication, class of the drug, and side effects. (all other drug classes)   Cardiac Rehab from 07/06/2020 in [facility] Cardiac and Pulmonary Rehab  Date 07/06/20  Educator AS  Instruction Review Code 1- Verbalizes Understanding       Go Sex-Intimacy & Heart Disease, Get SMART - Goal Setting: - Group verbal and written instruction through game format to discuss heart disease and the return to sexual intimacy. Provides group verbal and written material to discuss and apply goal setting through the application of the S.M.A.R.T. Method.   Other Matters of the Heart: - Provides group verbal, written materials and models to describe Stable Angina and Peripheral Artery. Includes description of the disease process and treatment options available to the cardiac patient.   Infection Prevention: - Provides verbal and written material to individual with discussion of infection control including proper hand washing and proper equipment cleaning during exercise session.   Cardiac Rehab from 07/06/2020 in [facility] Cardiac and Pulmonary Rehab  Date 04/19/20  Educator AS  Instruction Review Code 1- Verbalizes Understanding      Falls Prevention: - Provides verbal and written material to individual with discussion of falls prevention and safety.   Cardiac Rehab from 07/06/2020 in [facility] Cardiac and Pulmonary Rehab  Date 04/19/20  Educator AS  Instruction Review Code 1- Verbalizes Understanding      Other: -Provides group and verbal instruction on various topics (see comments)   Knowledge Questionnaire Score:  Knowledge Questionnaire Score - 08/24/20 1547      Knowledge Questionnaire Score   Post Score 23/26           Core Components/Risk Factors/Patient Goals at Admission:  Personal Goals and Risk Factors at Admission - 04/19/20 1519  Core Components/Risk Factors/Patient Goals on Admission    Weight Management Yes;Weight Maintenance    Intervention Weight Management: Develop a combined nutrition and exercise program designed to reach desired caloric intake, while maintaining appropriate intake of nutrient and fiber, sodium and fats, and appropriate energy expenditure required for the weight goal.;Weight Management: Provide education and appropriate resources to help participant work on and attain dietary goals.    Admit Weight 168 lb 12.8 oz (76.6 kg)    Goal Weight: Short Term 165 lb (74.8 kg)    Goal Weight: Long Term 165 lb (74.8 kg)    Expected Outcomes Short Term: Continue to assess and modify interventions until short term weight is achieved;Long Term: Adherence to nutrition and physical activity/exercise program aimed toward attainment of established weight goal;Weight Maintenance: Understanding of the daily nutrition guidelines, which includes 25-35% calories from fat, 7% or less cal from saturated fats, less than 2108m cholesterol, less than 1.5gm of sodium, & 5 or more servings of fruits and vegetables daily    Intervention Provide education on lifestyle modifcations including regular physical activity/exercise, weight management, moderate sodium restriction and increased consumption of fresh fruit, vegetables, and low fat dairy, alcohol moderation, and smoking cessation.;Monitor prescription use compliance.    Expected Outcomes Short Term: Continued assessment and intervention until BP is < 140/913mHG in hypertensive participants. < 130/8033mG in hypertensive participants with diabetes, heart failure or chronic kidney disease.;Long Term: Maintenance of blood pressure at goal levels.    Intervention Provide education and support for participant on nutrition & aerobic/resistive exercise along with prescribed medications to achieve LDL <7m17mDL >40mg58m Expected Outcomes Short Term: Participant states understanding of  desired cholesterol values and is compliant with medications prescribed. Participant is following exercise prescription and nutrition guidelines.;Long Term: Cholesterol controlled with medications as prescribed, with individualized exercise RX and with personalized nutrition plan. Value goals: LDL < 7mg,75m > 40 mg.           Education:Diabetes - Individual verbal and written instruction to review signs/symptoms of diabetes, desired ranges of glucose level fasting, after meals and with exercise. Acknowledge that pre and post exercise glucose checks will be done for 3 sessions at entry of program.   Education: Know Your Numbers and Risk Factors: -Group verbal and written instruction about important numbers in your health.  Discussion of what are risk factors and how they play a role in the disease process.  Review of Cholesterol, Blood Pressure, Diabetes, and BMI and the role they play in your overall health.   [facility]  Date 07/06/20  Educator AS  Instruction Review Code 1- Verbalizes Understanding      Core Components/Risk Factors/Patient Goals Review:   Goals and Risk Factor Review    Row Name 05/23/20 1525 06/29/20 1612 07/28/20 1540 08/17/20 1642       Core Components/Risk Factors/Patient Goals Review   Personal Goals Review Weight Management/Obesity;Hypertension;Lipids Weight Management/Obesity;Lipids;Hypertension Weight Management/Obesity;Lipids;Hypertension Weight Management/Obesity;Lipids;Hypertension    Review Mike wRonalee Beltshecking his blood pressure at home and has since stopped. Informed patient to bring in his home blood pressure cuff to check against. Patient is taking his blood pressure medications and lipids medications and has no questions regarding them. Mike sRonalee Beltss that he wants to maintain his weight currently. He ate a bit when he went to the beach but still managed to not put on that much weight. His blood pressures  have been lower since he  has been coming to rehab. Ronalee Belts is maintaining his weight and doing well overall. Blood pressures have been doing pretty good overall. Ronalee Belts graduates next week and plans to [facility] or [facility] the [employer] when he finishes.  He feels the best of the program is the staff!    Expected Outcomes Short: bring in blood pressure cuff. Long: Maintain blood pressures reading at home independently. Short: watch diet since returning from the beach. Long: maintain weight independently. Short: Continue to monitor blood pressure  Long; Conitnue to maintain weight and monitor his risk factors. Short: complete HT Long:  maintain exercise on his own           Core Components/Risk Factors/Patient Goals at Discharge (Final Review):   Goals and Risk Factor Review - 08/17/20 1642      Core Components/Risk Factors/Patient Goals Review   Personal Goals Review Weight Management/Obesity;Lipids;Hypertension    Review Ronalee Belts graduates next week and plans to [facility] back to the [facility] or join the [employer] when he finishes.  He feels the best of the program is the staff!    Expected Outcomes Short: complete HT Long:  maintain exercise on his own           ITP Comments:  ITP Comments    Row Name 04/15/20 1302 04/19/20 1518 04/27/20 1507 05/11/20 0552 06/08/20 0645   ITP Comments Initial telephone orientation completed. Diagnosis can be found in [facility] 4/10. EP orientation scheduled for 3/25 at 1pm Completed 6MWT and gym orientation.  Initial ITP created and sent for review to Dr. Emily Filbert, Medical Director. First full day of exercise!  Patient was oriented to gym and equipment including functions, settings, policies, and procedures.  Patient's individual exercise prescription and treatment plan were reviewed.  All starting workloads were established based on the results of the 6 minute walk test done at initial orientation visit.  The plan for exercise progression was also introduced and progression will be  customized based on patient's performance and goals. 30 Day review completed. Medical Director ITP review done, changes made as directed, and signed approval by Medical Director. 30 Day review completed. Medical Director ITP review done, changes made as directed, and signed approval by Medical Director.   Russell Name 07/06/20 6553 07/25/20 1723 08/03/20 1539 08/25/20 1536     ITP Comments 30 Day review completed. Medical Director ITP review done, changes made as directed, and signed approval by Medical Director. Spoke with patient, he has missed rehab appointments for the last week, he reported he was out of town last and confirmed he will be able to come to [facility] this upcoming Wednesday, 9/1. 30 day review completed. ITP sent to Dr. Emily Filbert, Medical Director of Cardiac and Pulmonary Rehab. Continue with ITP unless changes are made by physician. Emillio graduated today from  rehab with 36 sessions completed.  Details of the patient's exercise prescription and what He needs to do in order to continue the prescription and progress were discussed with patient.  Patient was given a copy of prescription and goals.  Patient verbalized understanding.  Davidson plans to continue to exercise by attending the [facility]           Comments: Discharge ITP

## 2020-08-25 NOTE — Progress Notes (Signed)
Daily Session Note  Patient Details  Name: Andre CRUTCHFIELD Sr. MRN: [id number] Date of Birth: [birth year] Referring Provider:     Cardiac Rehab from 04/19/2020 in [facility] Cardiac and Pulmonary Rehab  Referring Provider [name]      Encounter Date: 08/25/2020  Check In:  Session Check In - 08/25/20 [place]      Check-In   Supervising physician immediately available to respond to emergencies See telemetry face sheet for immediately available ER MD    Location [facility]-Cardiac & Pulmonary Rehab    Staff Present Renita Papa, RN BSN;Joseph Lou Miner, [place] Exercise Physiologist;Melissa Tilford Pillar RDN, LDN    Virtual Visit No    Medication changes reported     No    Fall or balance concerns reported    No    Warm-up and Cool-down Performed on first and last piece of equipment    Resistance Training Performed Yes    VAD Patient? No    PAD/SET Patient? No      Pain Assessment   Currently in Pain? No/denies              Social History   Tobacco Use  Smoking Status Never Smoker  Smokeless Tobacco Never Used    Goals Met:  Independence with exercise equipment Exercise tolerated well No report of cardiac concerns or symptoms Strength training completed today  Goals Unmet:  Not Applicable  Comments:  [name] graduated today from  rehab with 36 sessions completed.  Details of the patient's exercise prescription and what He needs to do in order to continue the prescription and progress were discussed with patient.  Patient was given a copy of prescription and goals.  Patient verbalized understanding.  Larson plans to continue to exercise by attending the [facility].     Dr. Emily Filbert is Medical Director for [place] and [facility].

## 2020-09-27 ENCOUNTER — Ambulatory Visit (INDEPENDENT_AMBULATORY_CARE_PROVIDER_SITE_OTHER): Payer: Medicare Other | Admitting: Vascular Surgery

## 2020-09-27 ENCOUNTER — Encounter (INDEPENDENT_AMBULATORY_CARE_PROVIDER_SITE_OTHER): Payer: Medicare Other

## 2020-10-11 ENCOUNTER — Encounter (INDEPENDENT_AMBULATORY_CARE_PROVIDER_SITE_OTHER): Payer: Medicare Other

## 2020-10-11 ENCOUNTER — Ambulatory Visit (INDEPENDENT_AMBULATORY_CARE_PROVIDER_SITE_OTHER): Payer: Medicare Other | Admitting: Vascular Surgery

## 2020-11-08 ENCOUNTER — Ambulatory Visit (INDEPENDENT_AMBULATORY_CARE_PROVIDER_SITE_OTHER): Payer: Medicare Other | Admitting: Vascular Surgery

## 2020-11-08 ENCOUNTER — Other Ambulatory Visit: Payer: Self-pay

## 2020-11-08 ENCOUNTER — Ambulatory Visit (INDEPENDENT_AMBULATORY_CARE_PROVIDER_SITE_OTHER): Payer: Medicare Other

## 2020-11-08 ENCOUNTER — Encounter (INDEPENDENT_AMBULATORY_CARE_PROVIDER_SITE_OTHER): Payer: Self-pay | Admitting: Vascular Surgery

## 2020-11-08 VITALS — BP 167/81 | HR 70 | Ht 67.0 in | Wt 177.0 lb

## 2020-11-08 DIAGNOSIS — I10 Essential (primary) hypertension: Secondary | ICD-10-CM | POA: Diagnosis not present

## 2020-11-08 DIAGNOSIS — I6523 Occlusion and stenosis of bilateral carotid arteries: Secondary | ICD-10-CM

## 2020-11-08 DIAGNOSIS — T7840XA Allergy, unspecified, initial encounter: Secondary | ICD-10-CM | POA: Insufficient documentation

## 2020-11-08 NOTE — Progress Notes (Signed)
MRN : [id number]  Andre GRANADE Sr. is a 79 y.o. (1941/01/31) male who presents with chief complaint of  Chief Complaint  Patient presents with  . Carotid    6 mo follow up  .  History of Present Illness: Patient returns in follow-up of his carotid disease.  He is over a decade status post right carotid endarterectomy. He has had coronary bypass with a prolonged recovery.  He is doing pretty well now.  His wife is sick and he is having to attend to her.  He has had no focal neurologic symptoms. Specifically, the patient denies amaurosis fugax, speech or swallowing difficulties, or arm or leg weakness or numbness. Duplex shows a patent right carotid endarterectomy with stable 40 to 59% left ICA stenosis.  Current Outpatient Medications  Medication Sig Dispense Refill  . aspirin EC 81 MG tablet Take 81 mg by mouth daily.    [name] atorvastatin (LIPITOR) 40 MG tablet Take by mouth.    . Cyanocobalamin (B-12) 100 MCG TABS Take by mouth.    . metoprolol tartrate (LOPRESSOR) 50 MG tablet Take 1 tablet (50 mg total) by mouth 2 (two) times daily. 60 tablet 0  . vitamin B-12 (CYANOCOBALAMIN) 250 MCG tablet Take 250 mcg by mouth as needed.    . Acetaminophen 325 MG CAPS Take by mouth. (Patient not taking: Reported on 11/08/2020)    . amiodarone (PACERONE) 200 MG tablet Take 400 mg by mouth three times daily through 4/29.  On, 4/30 reduce to 200mg  by mouth daily (Patient not taking: Reported on 11/08/2020)    . carboxymethylcellulose (REFRESH PLUS) 0.5 % SOLN Apply to eye. (Patient not taking: Reported on 11/08/2020)    . furosemide (LASIX) 40 MG tablet Take 1 tablet (40 mg total) by mouth daily. (Patient not taking: Reported on 11/08/2020) 30 tablet 0  . isosorbide mononitrate (IMDUR) 30 MG 24 hr tablet Take 1 tablet (30 mg total) by mouth daily. (Patient not taking: No sig reported) 30 tablet 0  . lisinopril (ZESTRIL) 5 MG tablet Take 1 tablet (5 mg total) by mouth daily. (Patient not taking: Reported  on 11/08/2020) 30 tablet 0  . spironolactone (ALDACTONE) 25 MG tablet Take 0.5 tablets (12.5 mg total) by mouth daily. (Patient not taking: No sig reported) 15 tablet 0  . temazepam (RESTORIL) 15 MG capsule Take 15 mg by mouth at bedtime as needed. (Patient not taking: Reported on 11/08/2020)     No current facility-administered medications for this visit.    Past Medical History:  Diagnosis Date  . Bursitis   . Elevated lipids   . Hypertension   . SOB (shortness of breath)     Past Surgical History:  Procedure Laterality Date  . CAROTID ENDARTERECTOMY Right   . COLONOSCOPY WITH PROPOFOL N/A 08/20/2016   Procedure: COLONOSCOPY WITH PROPOFOL;  Surgeon: Manya Silvas, MD;  Location: [facility] ENDOSCOPY;  Service: Endoscopy;  Laterality: N/A;  . HERNIA REPAIR     1970  (Bilateral Inguinal Hernia)  . LEFT HEART CATH AND CORONARY ANGIOGRAPHY N/A 03/07/2020   Procedure: LEFT HEART CATH AND CORONARY ANGIOGRAPHY;  Surgeon: Corey Skains, MD;  Location: [place] [facility];  Service: Cardiovascular;  Laterality: N/A;  . SHOULDER INJECTION    . THYROIDECTOMY, PARTIAL    . VASCULAR SURGERY     RT Carotid Endarterectomy    Social History  Substance Use Topics  . Smoking status: Never Smoker  . Smokeless tobacco: Never Used  . Alcohol  use No     Family History No bleeding or clotting disorders       Allergies  Allergen Reactions  . Crestor [Rosuvastatin Calcium] Other (See Comments)    Muscle pain     REVIEW OF SYSTEMS(Negative unless checked)  Constitutional: [] ??Weight loss[] ??Fever[] ??Chills Cardiac:[] ??Chest pain[] ??Chest pressure[] ??Palpitations [] ??Shortness of breath when laying flat [] ??Shortness of breath at rest [] ??Shortness of breath with exertion. Vascular: [] ??Pain in legs with walking[] ??Pain in legsat rest[] ??Pain in legs when laying flat [] ??Claudication [] ??Pain in feet when walking [] ??Pain in feet at rest  [] ??Pain in feet when laying flat [] ??History of DVT [] ??Phlebitis [] ??Swelling in legs [] ??Varicose veins [] ??Non-healing ulcers Pulmonary: [] ??Uses home oxygen [] ??Productive cough[] ??Hemoptysis [] ??Wheeze [] ??COPD [] ??Asthma Neurologic: [] ??Dizziness [] ??Blackouts [] ??Seizures [] ??History of stroke [] ??History of TIA[] ??Aphasia [] ??Temporary blindness[] ??Dysphagia [] ??Weaknessor numbness in arms [] ??Weakness or numbnessin legs Musculoskeletal: [x] ??Arthritis [] ??Joint swelling [] ??Joint pain [] ??Low back pain Hematologic:[] ??Easy bruising[] ??Easy bleeding [] ??Hypercoagulable state [] ??Anemic  Gastrointestinal:[] ??Blood in stool[] ??Vomiting blood[] ??Gastroesophageal reflux/heartburn[] ??Abdominal pain Genitourinary: [] ??Chronic kidney disease [] ??Difficulturination [] ??Frequenturination [] ??Burning with urination[] ??Hematuria Skin: [] ??Rashes [] ??Ulcers [] ??Wounds Psychological: [] ??History of anxiety[] ??History of major depression     Physical Examination  Vitals:   11/08/20 1335  BP: (!) 167/81  Pulse: 70  Weight: 177 lb (80.3 kg)  Height: 5\' 7"  (1.702 m)   Body mass index is 27.72 kg/m. Gen:  WD/WN, NAD Head: [place]/AT, No temporalis wasting. Ear/Nose/Throat: Hearing grossly intact, nares w/o erythema or drainage, trachea midline Eyes: Conjunctiva clear. Sclera non-icteric Neck: Supple.  No JVD Pulmonary:  Good air movement, equal and clear to auscultation bilaterally.  Cardiac: RRR, No JVD Vascular:  Vessel Right Left  Radial Palpable Palpable       Musculoskeletal: M/S 5/5 throughout.  No deformity or atrophy. No edema. Neurologic: CN 2-12 intact. Sensation grossly intact in extremities.  Symmetrical.  Speech is fluent. Motor exam as listed above. Psychiatric: Judgment intact, Mood & affect appropriate for pt's clinical situation. Dermatologic: No rashes or ulcers noted.  No cellulitis  or open wounds.      CBC Lab Results  Component Value Date   WBC 7.8 03/09/2020   HGB 12.6 (L) 03/09/2020   HCT 37.1 (L) 03/09/2020   MCV 87.9 03/09/2020   PLT 229 03/09/2020    BMET    Component Value Date/Time   NA 141 03/08/2020 0009   NA 144 09/29/2014 1333   K 3.5 03/08/2020 0009   K 3.8 09/29/2014 1333   CL 104 03/08/2020 0009   CL 107 09/29/2014 1333   CO2 26 03/08/2020 0009   CO2 29 09/29/2014 1333   GLUCOSE 91 03/08/2020 0009   GLUCOSE 94 09/29/2014 1333   BUN 26 (H) 03/08/2020 0009   BUN 15 09/29/2014 1333   CREATININE 1.06 03/08/2020 0009   CREATININE 1.23 09/29/2014 1333   CALCIUM 8.1 (L) 03/08/2020 0009   CALCIUM 8.6 09/29/2014 1333   GFRNONAA >60 03/08/2020 0009   GFRNONAA >60 09/29/2014 1333   GFRAA >60 03/08/2020 0009   GFRAA >60 09/29/2014 1333   CrCl cannot be calculated (Patient's most recent lab result is older than the maximum 21 days allowed.).  COAG Lab Results  Component Value Date   INR 1.1 03/05/2020    Radiology No results found.   Assessment/Plan Essential hypertension, benign blood pressure control important in reducing the progression of atherosclerotic disease. On appropriate oral medications.  Carotid stenosis Duplex shows a patent right carotid endarterectomy with stable 40 to 59% left ICA stenosis.  No progression previous studies.  Check in 6 months.   Leotis Pain,  MD  11/08/2020 2:26 PM    This note was created with Dragon medical transcription system.  Any errors from dictation are purely unintentional

## 2021-05-09 ENCOUNTER — Ambulatory Visit (INDEPENDENT_AMBULATORY_CARE_PROVIDER_SITE_OTHER): Payer: Medicare Other | Admitting: Vascular Surgery

## 2021-05-09 ENCOUNTER — Ambulatory Visit (INDEPENDENT_AMBULATORY_CARE_PROVIDER_SITE_OTHER): Payer: Medicare Other

## 2021-05-09 ENCOUNTER — Encounter (INDEPENDENT_AMBULATORY_CARE_PROVIDER_SITE_OTHER): Payer: Self-pay | Admitting: Vascular Surgery

## 2021-05-09 ENCOUNTER — Other Ambulatory Visit: Payer: Self-pay

## 2021-05-09 VITALS — BP 171/82 | HR 72 | Resp 16 | Wt 178.6 lb

## 2021-05-09 DIAGNOSIS — I6523 Occlusion and stenosis of bilateral carotid arteries: Secondary | ICD-10-CM

## 2021-05-09 DIAGNOSIS — I10 Essential (primary) hypertension: Secondary | ICD-10-CM

## 2021-05-09 DIAGNOSIS — E785 Hyperlipidemia, unspecified: Secondary | ICD-10-CM | POA: Diagnosis not present

## 2021-05-09 NOTE — Progress Notes (Signed)
MRN : [id number]  Andre CABA Sr. is a 80 y.o. (1941/01/20) male who presents with chief complaint of  Chief Complaint  Patient presents with   Carotid    Ultrasound follow up   .  History of Present Illness: Patient returns in follow-up of his carotid disease.  He is doing well today without significant complaints.  He is almost a decade status post right carotid endarterectomy.  He has had no focal neurologic symptoms or problems since his last visit.  Carotid duplex shows ICA velocities in the 1 to 39% range bilaterally which is improved from his previous study on the left.  He does have some elevated velocities in the left common carotid artery which may indicate slightly more significant stenosis although clearly not high-grade.  Current Outpatient Medications  Medication Sig Dispense Refill   aspirin EC 81 MG tablet Take 81 mg by mouth daily.     atorvastatin (LIPITOR) 40 MG tablet Take by mouth.     Cyanocobalamin (B-12) 100 MCG TABS Take by mouth.     metoprolol tartrate (LOPRESSOR) 50 MG tablet Take 1 tablet (50 mg total) by mouth 2 (two) times daily. 60 tablet 0   Acetaminophen 325 MG CAPS Take by mouth. (Patient not taking: No sig reported)     amiodarone (PACERONE) 200 MG tablet Take 400 mg by mouth three times daily through 4/29.  On, 4/30 reduce to 200mg  by mouth daily (Patient not taking: No sig reported)     carboxymethylcellulose (REFRESH PLUS) 0.5 % SOLN Apply to eye. (Patient not taking: No sig reported)     furosemide (LASIX) 40 MG tablet Take 1 tablet (40 mg total) by mouth daily. (Patient not taking: Reported on 11/08/2020) 30 tablet 0   isosorbide mononitrate (IMDUR) 30 MG 24 hr tablet Take 1 tablet (30 mg total) by mouth daily. (Patient not taking: No sig reported) 30 tablet 0   lisinopril (ZESTRIL) 5 MG tablet Take 1 tablet (5 mg total) by mouth daily. (Patient not taking: No sig reported) 30 tablet 0   spironolactone (ALDACTONE) 25 MG tablet Take 0.5 tablets  (12.5 mg total) by mouth daily. (Patient not taking: No sig reported) 15 tablet 0   temazepam (RESTORIL) 15 MG capsule Take 15 mg by mouth at bedtime as needed. (Patient not taking: No sig reported)     vitamin B-12 (CYANOCOBALAMIN) 250 MCG tablet Take 250 mcg by mouth as needed. (Patient not taking: Reported on 05/09/2021)     No current facility-administered medications for this visit.    Past Medical History:  Diagnosis Date   Bursitis    Elevated lipids    Hypertension    SOB (shortness of breath)     Past Surgical History:  Procedure Laterality Date   CAROTID ENDARTERECTOMY Right    COLONOSCOPY WITH PROPOFOL N/A 08/20/2016   Procedure: COLONOSCOPY WITH PROPOFOL;  Surgeon: Manya Silvas, MD;  Location: [facility] ENDOSCOPY;  Service: Endoscopy;  Laterality: N/A;   HERNIA REPAIR     1970  (Bilateral Inguinal Hernia)   LEFT HEART CATH AND CORONARY ANGIOGRAPHY N/A 03/07/2020   Procedure: LEFT HEART CATH AND CORONARY ANGIOGRAPHY;  Surgeon: Corey Skains, MD;  Location: [place] [facility];  Service: Cardiovascular;  Laterality: N/A;   SHOULDER INJECTION     THYROIDECTOMY, PARTIAL     VASCULAR SURGERY     RT Carotid Endarterectomy    Social History  Substance Use Topics   Smoking status: Never Smoker  Smokeless tobacco: Never Used   Alcohol use No        Family History No bleeding or clotting disorders            Allergies  Allergen Reactions   Crestor [Rosuvastatin Calcium] Other (See Comments)      Muscle pain        REVIEW OF SYSTEMS (Negative unless checked)   Constitutional: [] Weight loss  [] Fever  [] Chills Cardiac: [] Chest pain   [] Chest pressure   [] Palpitations   [] Shortness of breath when laying flat   [] Shortness of breath at rest   [] Shortness of breath with exertion. Vascular:  [] Pain in legs with walking   [] Pain in legs at rest   [] Pain in legs when laying flat   [] Claudication   [] Pain in feet when walking  [] Pain in feet at rest  [] Pain in feet when  laying flat   [] History of DVT   [] Phlebitis   [] Swelling in legs   [] Varicose veins   [] Non-healing ulcers Pulmonary:   [] Uses home oxygen   [] Productive cough   [] Hemoptysis   [] Wheeze  [] COPD   [] Asthma Neurologic:  [] Dizziness  [] Blackouts   [] Seizures   [] History of stroke   [] History of TIA  [] Aphasia   [] Temporary blindness   [] Dysphagia   [] Weakness or numbness in arms   [] Weakness or numbness in legs Musculoskeletal:  [x] Arthritis   [] Joint swelling   [] Joint pain   [] Low back pain Hematologic:  [] Easy bruising  [] Easy bleeding   [] Hypercoagulable state   [] Anemic   Gastrointestinal:  [] Blood in stool   [] Vomiting blood  [] Gastroesophageal reflux/heartburn   [] Abdominal pain Genitourinary:  [] Chronic kidney disease   [] Difficult urination  [] Frequent urination  [] Burning with urination   [] Hematuria Skin:  [] Rashes   [] Ulcers   [] Wounds Psychological:  [] History of anxiety   []  History of major depression      Physical Examination  Vitals:   05/09/21 1354 05/09/21 1355  BP: (!) 178/77 (!) 171/82  Pulse: 72   Resp: 16   Weight: 178 lb 9.6 oz (81 kg)    Body mass index is 27.97 kg/m. Gen:  WD/WN, NAD.  Appears younger than stated age Head: [place]/AT, No temporalis wasting. Ear/Nose/Throat: Hearing grossly intact, nares w/o erythema or drainage, trachea midline Eyes: Conjunctiva clear. Sclera non-icteric Neck: Supple.   Pulmonary:  Good air movement, equal and clear to auscultation bilaterally.  Cardiac: RRR, No JVD Vascular:  Vessel Right Left  Radial Palpable Palpable           Musculoskeletal: M/S 5/5 throughout.  No deformity or atrophy. No edema. Neurologic: CN 2-12 intact. Sensation grossly intact in extremities.  Symmetrical.  Speech is fluent. Motor exam as listed above. Psychiatric: Judgment intact, Mood & affect appropriate for pt's clinical situation. Dermatologic: No rashes or ulcers noted.  No cellulitis or open wounds.     CBC Lab Results  Component  Value Date   WBC 7.8 03/09/2020   HGB 12.6 (L) 03/09/2020   HCT 37.1 (L) 03/09/2020   MCV 87.9 03/09/2020   PLT 229 03/09/2020    BMET    Component Value Date/Time   NA 141 03/08/2020 0009   NA 144 09/29/2014 1333   K 3.5 03/08/2020 0009   K 3.8 09/29/2014 1333   CL 104 03/08/2020 0009   CL 107 09/29/2014 1333   CO2 26 03/08/2020 0009   CO2 29 09/29/2014 1333   GLUCOSE 91 03/08/2020 0009   GLUCOSE  94 09/29/2014 1333   BUN 26 (H) 03/08/2020 0009   BUN 15 09/29/2014 1333   CREATININE 1.06 03/08/2020 0009   CREATININE 1.23 09/29/2014 1333   CALCIUM 8.1 (L) 03/08/2020 0009   CALCIUM 8.6 09/29/2014 1333   GFRNONAA >60 03/08/2020 0009   GFRNONAA >60 09/29/2014 1333   GFRAA >60 03/08/2020 0009   GFRAA >60 09/29/2014 1333   CrCl cannot be calculated (Patient's most recent lab result is older than the maximum 21 days allowed.).  COAG Lab Results  Component Value Date   INR 1.1 03/05/2020    Radiology No results found.   Assessment/Plan Carotid stenosis Carotid duplex shows ICA velocities in the 1 to 39% range bilaterally which is improved from his previous study on the left.  He does have some elevated velocities in the left common carotid artery which may indicate slightly more significant stenosis although clearly not high-grade.  Continue aspirin and statin agent.  Recheck in 6 months.  Essential hypertension blood pressure control important in reducing the progression of atherosclerotic disease. On appropriate oral medications.   HLD (hyperlipidemia) lipid control important in reducing the progression of atherosclerotic disease. Continue statin therapy    Leotis Pain, MD  05/09/2021 3:17 PM    This note was created with Dragon medical transcription system.  Any errors from dictation are purely unintentional

## 2021-05-09 NOTE — Assessment & Plan Note (Signed)
Carotid duplex shows ICA velocities in the 1 to 39% range bilaterally which is improved from his previous study on the left.  He does have some elevated velocities in the left common carotid artery which may indicate slightly more significant stenosis although clearly not high-grade.  Continue aspirin and statin agent.  Recheck in 6 months.

## 2021-05-09 NOTE — Assessment & Plan Note (Signed)
blood pressure control important in reducing the progression of atherosclerotic disease. On appropriate oral medications.  

## 2021-05-09 NOTE — Assessment & Plan Note (Signed)
lipid control important in reducing the progression of atherosclerotic disease. Continue statin therapy  

## 2021-06-22 ENCOUNTER — Other Ambulatory Visit: Payer: Self-pay | Admitting: Family Medicine

## 2021-06-22 DIAGNOSIS — E041 Nontoxic single thyroid nodule: Secondary | ICD-10-CM

## 2021-06-29 ENCOUNTER — Other Ambulatory Visit: Payer: Self-pay

## 2021-06-29 ENCOUNTER — Ambulatory Visit: Payer: Medicare Other | Attending: Neurosurgery

## 2021-06-29 VITALS — BP 159/64 | HR 72 | Resp 18 | Ht 70.0 in | Wt 172.0 lb

## 2021-06-29 DIAGNOSIS — R2681 Unsteadiness on feet: Secondary | ICD-10-CM | POA: Diagnosis present

## 2021-06-29 DIAGNOSIS — R262 Difficulty in walking, not elsewhere classified: Secondary | ICD-10-CM | POA: Diagnosis not present

## 2021-06-29 NOTE — Therapy (Signed)
[place] MAIN [facility] SERVICES [address] [place], [place], [address] Phone: [phone]   Fax:  [phone]  Physical Therapy Evaluation  Patient Details  Name: Andre KAUPP Sr. MRN: [id number] Date of Birth: 1941/07/04 Referring Provider (PT): Dr. Elayne Guerin   Encounter Date: 06/29/2021   PT End of Session - 06/29/21 1538     Visit Number 1    Number of Visits 8    Date for PT Re-Evaluation 08/24/21    Authorization Time Period Initial Certification = [id number]- 08/24/2021    PT Start Time 1430    PT Stop Time 1520    PT Time Calculation (min) 50 min    Equipment Utilized During Treatment Gait belt    Activity Tolerance Patient tolerated treatment well    Behavior During Therapy WFL for tasks assessed/performed             Past Medical History:  Diagnosis Date   Bursitis    Elevated lipids    Hypertension    SOB (shortness of breath)     Past Surgical History:  Procedure Laterality Date   CAROTID ENDARTERECTOMY Right    COLONOSCOPY WITH PROPOFOL N/A 08/20/2016   Procedure: COLONOSCOPY WITH PROPOFOL;  Surgeon: Manya Silvas, MD;  Location: [facility] ENDOSCOPY;  Service: Endoscopy;  Laterality: N/A;   HERNIA REPAIR     1970  (Bilateral Inguinal Hernia)   LEFT HEART CATH AND CORONARY ANGIOGRAPHY N/A 03/07/2020   Procedure: LEFT HEART CATH AND CORONARY ANGIOGRAPHY;  Surgeon: Corey Skains, MD;  Location: [place] [facility];  Service: Cardiovascular;  Laterality: N/A;   SHOULDER INJECTION     THYROIDECTOMY, PARTIAL     VASCULAR SURGERY     RT Carotid Endarterectomy    Vitals:   06/29/21 1445  BP: (!) 159/64  Pulse: 72  Resp: 18  SpO2: 100%  Weight: 172 lb (78 kg)  Height: '5\' 10"'$  (1.778 m)      Subjective Assessment - 06/29/21 1450     Subjective Patient reports he is here today due to recent issues with impaired balance and 1 fall while descending steps. He states he is doing better and almost cancelled  his visit but ultimately decided to attend and agreed to evaluation today.    Pertinent History Per recent note from Dr. Wille Celeste-  chief complaint of balance issues. He denies urinary issues or confusion.    He began having balance issues recently. He had a near fall on June 22. He was worked up at an outside facility where possible hydrocephalus was noted. He also had a posterior fossa cyst. He presents today for evaluation. He denies headaches.    Walking long distances are okay, but walking short distances can be troublesome. He denies falls. He gets worse later in the day.    Limitations Sitting;Reading    How long can you sit comfortably? No issues    How long can you stand comfortably? 15-20 min    How long can you walk comfortably? 15-20 min    Diagnostic tests MRI Brain 05/17/21  IMPRESSION:     LATERAL AND THIRD VENTRICULAR ENLARGEMENT SLIGHTLY OUT OF PROPORTION TO DEGREE OF PARENCHYMAL ATROPHY. NO DEFINITE TRANSEPENDYMAL FLOW TO SUGGEST THIS IS ACUTE HYDROCEPHALUS. DIFFERENTIAL CONSIDERATION INCLUDE CENTRAL ATROPHY AND NORMAL PRESSURE HYDROCEPHALUS, CORRELATE WITH CLINICAL SYMPTOMS.     POSTERIOR FOSSA ARACHNOID CYST VERSUS MEGA CISTERNA MAGNA, WHICH ARE DIFFICULT TO DIFFERENTIATE ON MRI.     Electronically Signed by:  Penelope Coop on 05/17/2021 3:56 PM    Patient Stated Goals To walk wel l- resume his activities at [facility] and not fall    Currently in Pain? No/denies                [facility] PT Assessment - 06/29/21 1451       Assessment   Referring Provider (PT) Dr. Elayne Guerin    Hand Dominance Right    Prior Therapy no      Precautions   Precautions None      Restrictions   Weight Bearing Restrictions No      Balance Screen   Has the patient fallen in the past 6 months Yes    How many times? 1   Going [place] steps at beach   Has the patient had a decrease in activity level because of a fear of falling?  Yes    Is the patient reluctant to leave their home  because of a fear of falling?  No      Home Environment   Living Environment Private residence    Living Arrangements Alone    Available Help at Discharge Family    Type of [place] to enter    Entrance Stairs-Number of Steps 2    Entrance Stairs-Rails None    Home Layout Two level    [place] - single point      Prior Function   Level of [place] Retired      Associate Professor   Overall Cognitive Status Within Functional Limits for tasks assessed    Attention Focused    Focused Attention Appears intact    Memory Appears intact    Awareness Appears intact    Problem Solving Appears intact    Executive Function Reasoning;Sequencing;Organizing;Decision Making;Initiating;Self Monitoring;Self Correcting    Reasoning Appears intact    Sequencing Appears intact    Organizing Appears intact    Decision Making Appears intact    Initiating Appears intact    Self Monitoring Appears intact    Self Correcting Appears intact      Observation/Other Assessments   Skin Integrity No integumentary issues    Focus on Therapeutic Outcomes (FOTO)  Will deliver next visit      Functional Gait  Assessment   Gait assessed  Yes    Gait Level Surface Walks 20 ft in less than 5.5 sec, no assistive devices, good speed, no evidence for imbalance, normal gait pattern, deviates no more than 6 in outside of the 12 in walkway width.    Change in Gait Speed Able to smoothly change walking speed without loss of balance or gait deviation. Deviate no more than 6 in outside of the 12 in walkway width.    Gait with Horizontal Head Turns Performs head turns smoothly with no change in gait. Deviates no more than 6 in outside 12 in walkway width    Gait with Vertical Head Turns Performs head turns with no change in gait. Deviates no more than 6 in outside 12 in walkway width.    Gait and Pivot Turn Pivot turns safely within 3 sec and stops quickly with no loss  of balance.    Step Over Obstacle Is able to step over 2 stacked shoe boxes taped together (9 in total height) without changing gait speed. No evidence of imbalance.    Gait with Narrow Base of Support Ambulates 7-9 steps.  Gait with Eyes Closed Walks 20 ft, uses assistive device, slower speed, mild gait deviations, deviates 6-10 in outside 12 in walkway width. Ambulates 20 ft in less than 9 sec but greater than 7 sec.    Ambulating Backwards Walks 20 ft, no assistive devices, good speed, no evidence for imbalance, normal gait    Steps Alternating feet, no rail.    Total Score 28               OBJECTIVE  MUSCULOSKELETAL: Tremor: Absent Bulk: Normal Tone: Normal, no clonus  Posture No gross abnormalities noted in standing or seated posture  Gait Patient ambulated in clinic today without an AD with decreased step length and gait speed = 0.97 m/s   Strength R/L 5/5 Hip flexion 5/5 Hip external rotation 5/5 Hip internal rotation 5/5 Hip extension  5/5 Hip abduction 5/5 Hip adduction 5/5 Knee extension 5/5 Knee flexion 5/5 Ankle Plantarflexion 5/5 Ankle Dorsiflexion   NEUROLOGICAL:  Mental Status Patient is oriented to person, place and time.  Recent memory is intact.  Remote memory is intact.  Attention span and concentration are intact.  Expressive speech is intact.  Patient's fund of knowledge is within normal limits for educational level.  Sensation Grossly intact to light touch bilateral UEs/LEs as determined by testing dermatomes C2-T2/L2-S2 respectively Proprioception and hot/cold testing deferred on this date    FUNCTIONAL OUTCOME MEASURES   Results Comments          FGA 28/30               10 Meter Gait Speed Self-selected: 10.3 s =0.97 m/s;  Below normative values for full community ambulation             ASSESSMENT Clinical Impression: Pt is a pleasant 80 year-old male referred for difficulty with balance. Patient reports he was in  his usual state of indpendence prior to a few week ago. He reports he recently completed the Cardiac rehab program and was going over to the community wellness zone 3x/week for activity. He states he has had 1 fall in past 6 week - falling down steps while at beach and feeling better over past 2 weeks. PT examination reveals deficits including min balance impairment as seen by score of 28/30 on FGA, decreased gait speed <1.0 m/s and decreased single leg stance time. Pt will benefit from skilled PT services to address deficits in balance and decrease risk for future falls.                    Objective measurements completed on examination: See above findings.               PT Education - 06/29/21 1537     Education Details PT plan of care, Role of PT and balance education    Person(s) Educated Patient    Methods Explanation;Verbal cues    Comprehension Verbalized understanding;Verbal cues required;Need further instruction              PT Short Term Goals - 06/29/21 1543       PT SHORT TERM GOAL #1   Title Pt will be independent with HEP in order to improve strength and balance in order to decrease fall risk and improve function at home and work.    Baseline 06/29/2021= Patient presents with no formal HEP involving balance in place    Time 4    Period Weeks    Status New    Target Date  07/27/21               PT Long Term Goals - 06/29/21 1545       PT LONG TERM GOAL #1   Title Pt will improve FGA by at least 2 points in order to demonstrate clinically significant improvement in balance and decreased risk for falls.    Baseline 06/29/2021= 28/30    Time 8    Period Weeks    Status New    Target Date 08/24/21      PT LONG TERM GOAL #2   Title Patient will demonstrate improved dynamic standing Balance by achieving SLS > 15 sec each leg consistently indicating improved LE strength and improved balance.    Baseline 06/29/2021 -Patient performed SLS each  leg < 3 sec each.    Time 8    Period Weeks    Status New    Target Date 08/24/21      PT LONG TERM GOAL #3   Title Pt will increase 10MWT by at least 0.13 m/s in order to demonstrate clinically significant improvement in community ambulation.    Baseline 06/29/2021= 0.97 m/s    Time 8    Period Weeks    Status New    Target Date 08/24/21                    Plan - 06/29/21 1539     Clinical Impression Statement Pt is a pleasant 80 year-old male referred for difficulty with balance. Patient reports he was in his usual state of indpendence prior to a few week ago. He reports he recently completed the Cardiac rehab program and was going over to the community wellness zone 3x/week for activity. He states he has had 1 fall in past 6 week - falling down steps while at beach and feeling better over past 2 weeks. PT examination reveals deficits including min balance impairment as seen by score of 28/30 on FGA, decreased gait speed <1.0 m/s and decreased single leg stance time. Pt will benefit from skilled PT services to address deficits in balance and decrease risk for future falls.    Examination-Activity Limitations Locomotion Level    Examination-Participation Restrictions Community Activity;Yard Work    Merchant navy officer Stable/Uncomplicated    Surveyor, mining    Rehab Potential Good    PT Frequency 1x / week    PT Duration 8 weeks    PT Treatment/Interventions ADLs/Self Care Home Management;Cryotherapy;Moist Heat;Gait training;Stair training;Functional mobility training;Therapeutic activities;Therapeutic exercise;Balance training;Neuromuscular re-education;Patient/family education;Manual techniques;Dry needling    PT Next Visit Plan instruct in higher level balance exercises; administer FOTO; Go to [employer] and observe/instruct in safety with all activities.    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/20/2020   Acute postoperative pain 03/20/2020   Insomnia 03/20/2020   Hyperglycemia 03/19/2020   Aortic atherosclerosis ([place]) 03/14/2020   Moderate aortic valve stenosis 03/14/2020   Acute  systolic CHF (congestive heart failure) (H[facility])    Thoracic aortic aneurysm without rupture ([place])    NSTEMI (non-ST elevated myocardial infarction) ([place]) 03/05/2020   Acute respiratory failure with hypoxia ([facility]) 03/05/2020   Leukocytosis 03/05/2020   HLD (hyperlipidemia) 03/05/2020   Acute on chronic respiratory failure with hypoxia ([place]) 03/05/2020   Essential hypertension 12/28/2016   Carotid stenosis 12/28/2016    Lewis Moccasin, PT 06/29/2021, 4:05 PM  [place] MAIN [facility] SERVICES [address] [place], [place], [address] Phone: [phone]   Fax:  [phone]  Name: Andre MOTTON Sr. MRN: [id number] Date of Birth: 1941-11-17

## 2021-07-03 ENCOUNTER — Ambulatory Visit: Payer: Medicare Other

## 2021-07-06 ENCOUNTER — Ambulatory Visit: Payer: Medicare Other

## 2021-07-12 ENCOUNTER — Ambulatory Visit: Payer: Medicare Other

## 2021-07-19 ENCOUNTER — Ambulatory Visit: Payer: Medicare Other

## 2021-07-25 ENCOUNTER — Ambulatory Visit: Payer: Medicare Other

## 2021-07-27 ENCOUNTER — Ambulatory Visit: Payer: Medicare Other | Attending: Neurosurgery

## 2021-08-02 ENCOUNTER — Ambulatory Visit: Payer: Medicare Other

## 2021-08-07 ENCOUNTER — Ambulatory Visit: Payer: Medicare Other

## 2021-08-15 ENCOUNTER — Ambulatory Visit
Admission: RE | Admit: 2021-08-15 | Discharge: 2021-08-15 | Disposition: A | Discharge status: Home / Self Care | Payer: Medicare Other | Source: Ambulatory Visit | Attending: Family Medicine | Admitting: Family Medicine

## 2021-08-15 ENCOUNTER — Other Ambulatory Visit: Payer: Self-pay

## 2021-08-15 DIAGNOSIS — E041 Nontoxic single thyroid nodule: Secondary | ICD-10-CM | POA: Diagnosis not present

## 2021-08-16 ENCOUNTER — Ambulatory Visit: Payer: Medicare Other

## 2021-08-21 ENCOUNTER — Ambulatory Visit: Payer: Medicare Other

## 2021-08-23 ENCOUNTER — Ambulatory Visit: Payer: Medicare Other

## 2021-11-07 ENCOUNTER — Other Ambulatory Visit: Payer: Self-pay

## 2021-11-07 ENCOUNTER — Ambulatory Visit (INDEPENDENT_AMBULATORY_CARE_PROVIDER_SITE_OTHER): Payer: Medicare Other

## 2021-11-07 ENCOUNTER — Encounter (INDEPENDENT_AMBULATORY_CARE_PROVIDER_SITE_OTHER): Payer: Self-pay | Admitting: Vascular Surgery

## 2021-11-07 ENCOUNTER — Ambulatory Visit (INDEPENDENT_AMBULATORY_CARE_PROVIDER_SITE_OTHER): Payer: Medicare Other | Admitting: Vascular Surgery

## 2021-11-07 VITALS — BP 185/84 | HR 63 | Resp 16 | Ht 70.0 in | Wt 179.0 lb

## 2021-11-07 DIAGNOSIS — E785 Hyperlipidemia, unspecified: Secondary | ICD-10-CM | POA: Diagnosis not present

## 2021-11-07 DIAGNOSIS — I6523 Occlusion and stenosis of bilateral carotid arteries: Secondary | ICD-10-CM

## 2021-11-07 DIAGNOSIS — I10 Essential (primary) hypertension: Secondary | ICD-10-CM

## 2021-11-07 NOTE — Progress Notes (Signed)
MRN : [id number]  Andre Murphy is a 80 y.o. ([birth year]) male who presents with chief complaint of No chief complaint on file. [name]  History of Present Illness: Patient returns today in follow up of his carotid disease.  He is many years status post right carotid endarterectomy for high-grade stenosis.  He is doing well and does not have any complaints today.  No focal neurologic symptoms. Specifically, the patient denies amaurosis fugax, speech or swallowing difficulties, or arm or leg weakness or numbness.  Carotid duplex today reveals a widely patent right carotid endarterectomy and velocities that would fall in the 1 to 39% range on the left slightly improved from his last study.  Current Outpatient Medications  Medication Sig Dispense Refill   Acetaminophen 325 MG CAPS Take by mouth. (Patient not taking: No sig reported)     amiodarone (PACERONE) 200 MG tablet Take 400 mg by mouth three times daily through 4/29.  On, 4/30 reduce to 200mg  by mouth daily (Patient not taking: No sig reported)     aspirin EC 81 MG tablet Take 81 mg by mouth daily.     atorvastatin (LIPITOR) 40 MG tablet Take by mouth.     carboxymethylcellulose (REFRESH PLUS) 0.5 % SOLN Apply to eye. (Patient not taking: No sig reported)     Cyanocobalamin (B-12) 100 MCG TABS Take by mouth.     furosemide (LASIX) 40 MG tablet Take 1 tablet (40 mg total) by mouth daily. (Patient not taking: Reported on 11/08/2020) 30 tablet 0   isosorbide mononitrate (IMDUR) 30 MG 24 hr tablet Take 1 tablet (30 mg total) by mouth daily. (Patient not taking: No sig reported) 30 tablet 0   lisinopril (ZESTRIL) 5 MG tablet Take 1 tablet (5 mg total) by mouth daily. (Patient not taking: No sig reported) 30 tablet 0   metoprolol tartrate (LOPRESSOR) 50 MG tablet Take 1 tablet (50 mg total) by mouth 2 (two) times daily. 60 tablet 0   spironolactone (ALDACTONE) 25 MG tablet Take 0.5 tablets (12.5 mg total) by mouth daily. (Patient not taking: No sig  reported) 15 tablet 0   temazepam (RESTORIL) 15 MG capsule Take 15 mg by mouth at bedtime as needed. (Patient not taking: No sig reported)     vitamin B-12 (CYANOCOBALAMIN) 250 MCG tablet Take 250 mcg by mouth as needed. (Patient not taking: Reported on 05/09/2021)     No current facility-administered medications for this visit.    Past Medical History:  Diagnosis Date   Bursitis    Elevated lipids    Hypertension    SOB (shortness of breath)     Past Surgical History:  Procedure Laterality Date   CAROTID ENDARTERECTOMY Right    COLONOSCOPY WITH PROPOFOL N/A 08/20/2016   Procedure: COLONOSCOPY WITH PROPOFOL;  Surgeon: Manya Silvas, MD;  Location: [facility] ENDOSCOPY;  Service: Endoscopy;  Laterality: N/A;   HERNIA REPAIR     1970  (Bilateral Inguinal Hernia)   LEFT HEART CATH AND CORONARY ANGIOGRAPHY N/A 03/07/2020   Procedure: LEFT HEART CATH AND CORONARY ANGIOGRAPHY;  Surgeon: Corey Skains, MD;  Location: [place] [facility];  Service: Cardiovascular;  Laterality: N/A;   SHOULDER INJECTION     THYROIDECTOMY, PARTIAL     VASCULAR SURGERY     RT Carotid Endarterectomy     Social History   Tobacco Use   Smoking status: Never   Smokeless tobacco: Never  Vaping Use   Vaping Use: Never used  Substance Use  Topics   Alcohol use: No   Drug use: No      Family History  Problem Relation Age of Onset   Stroke Mother    Cancer Father      Allergies  Allergen Reactions   Crestor [Rosuvastatin Calcium] Other (See Comments)    Muscle pain   Rosuvastatin     Other reaction(s): Muscle Pain     REVIEW OF SYSTEMS (Negative unless checked)  Constitutional: [] Weight loss  [] Fever  [] Chills Cardiac: [] Chest pain   [] Chest pressure   [] Palpitations   [] Shortness of breath when laying flat   [] Shortness of breath at rest   [] Shortness of breath with exertion. Vascular:  [] Pain in legs with walking   [] Pain in legs at rest   [] Pain in legs when laying flat   [] Claudication    [] Pain in feet when walking  [] Pain in feet at rest  [] Pain in feet when laying flat   [] History of DVT   [] Phlebitis   [] Swelling in legs   [] Varicose veins   [] Non-healing ulcers Pulmonary:   [] Uses home oxygen   [] Productive cough   [] Hemoptysis   [] Wheeze  [] COPD   [] Asthma Neurologic:  [] Dizziness  [] Blackouts   [] Seizures   [] History of stroke   [] History of TIA  [] Aphasia   [] Temporary blindness   [] Dysphagia   [] Weakness or numbness in arms   [] Weakness or numbness in legs Musculoskeletal:  [x] Arthritis   [] Joint swelling   [] Joint pain   [] Low back pain Hematologic:  [] Easy bruising  [] Easy bleeding   [] Hypercoagulable state   [] Anemic   Gastrointestinal:  [] Blood in stool   [] Vomiting blood  [] Gastroesophageal reflux/heartburn   [] Abdominal pain Genitourinary:  [] Chronic kidney disease   [] Difficult urination  [] Frequent urination  [] Burning with urination   [] Hematuria Skin:  [] Rashes   [] Ulcers   [] Wounds Psychological:  [] History of anxiety   []  History of major depression.  Physical Examination  There were no vitals taken for this visit. Gen:  WD/WN, NAD. Appears younger than stated age. Head: [place]/AT, No temporalis wasting. Ear/Nose/Throat: Hearing grossly intact, nares w/o erythema or drainage Eyes: Conjunctiva clear. Sclera non-icteric Neck: Supple.  Trachea midline Pulmonary:  Good air movement, no use of accessory muscles.  Cardiac: RRR, no JVD Vascular:  Vessel Right Left  Radial Palpable Palpable                   Musculoskeletal: M/S 5/5 throughout.  No deformity or atrophy. No edema. Neurologic: Sensation grossly intact in extremities.  Symmetrical.  Speech is fluent.  Psychiatric: Judgment intact, Mood & affect appropriate for pt's clinical situation. Dermatologic: No rashes or ulcers noted.  No cellulitis or open wounds.      Labs No results found for this or any previous visit (from the past 2160 hour(s)).  Radiology No results  found.  Assessment/Plan Carotid stenosis Carotid duplex shows ICA velocities in the 1 to 39% range bilaterally which is improved from his previous study on the left.  He does have some elevated velocities in the left common carotid artery which may indicate slightly more significant stenosis although clearly not high-grade.  Continue aspirin and statin agent.  Recheck in 6 months.   Essential hypertension blood pressure control important in reducing the progression of atherosclerotic disease. On appropriate oral medications.     HLD (hyperlipidemia) lipid control important in reducing the progression of atherosclerotic disease. Continue statin therapy    Leotis Pain, MD  11/07/2021  2:23 PM    This note was created with Dragon medical transcription system.  Any errors from dictation are purely unintentional

## 2022-05-08 ENCOUNTER — Ambulatory Visit (INDEPENDENT_AMBULATORY_CARE_PROVIDER_SITE_OTHER): Payer: Medicare Other | Admitting: Vascular Surgery

## 2022-05-08 ENCOUNTER — Ambulatory Visit (INDEPENDENT_AMBULATORY_CARE_PROVIDER_SITE_OTHER): Payer: Medicare Other

## 2022-05-08 ENCOUNTER — Encounter (INDEPENDENT_AMBULATORY_CARE_PROVIDER_SITE_OTHER): Payer: Self-pay | Admitting: Vascular Surgery

## 2022-05-08 VITALS — BP 134/74 | HR 74 | Resp 16 | Wt 171.4 lb

## 2022-05-08 DIAGNOSIS — I6523 Occlusion and stenosis of bilateral carotid arteries: Secondary | ICD-10-CM

## 2022-05-08 DIAGNOSIS — I10 Essential (primary) hypertension: Secondary | ICD-10-CM

## 2022-05-08 DIAGNOSIS — E785 Hyperlipidemia, unspecified: Secondary | ICD-10-CM | POA: Diagnosis not present

## 2022-05-08 NOTE — Assessment & Plan Note (Signed)
Carotid duplex today reveals a widely patent right carotid endarterectomy and velocities just into the 40 to 59% range on the left which has a slight progression from his last study.  Continue current medical regimen.  Recheck in 6 months.

## 2022-05-08 NOTE — Progress Notes (Signed)
MRN : [id number]  Andre Murphy is a 81 y.o. (1941/07/30) male who presents with chief complaint of  Chief Complaint  Patient presents with   Follow-up    Ultrasound follow up   .  History of Present Illness: Patient returns today in follow up of his carotid disease.  He is over a decade status post right carotid endarterectomy.  He is doing well.  No focal neurologic deficits.  We had a long discussion today about multiple sports as we usually do which was to my enjoyment. Carotid duplex today reveals a widely patent right carotid endarterectomy and velocities just into the 40 to 59% range on the left which has a slight progression from his last study  Current Outpatient Medications  Medication Sig Dispense Refill   aspirin EC 81 MG tablet Take 81 mg by mouth daily.     Cyanocobalamin (B-12) 100 MCG TABS Take by mouth.     metoprolol tartrate (LOPRESSOR) 50 MG tablet Take 1 tablet (50 mg total) by mouth 2 (two) times daily. 60 tablet 0   Acetaminophen 325 MG CAPS Take by mouth. (Patient not taking: Reported on 11/08/2020)     amiodarone (PACERONE) 200 MG tablet Take 400 mg by mouth three times daily through 4/29.  On, 4/30 reduce to '200mg'$  by mouth daily (Patient not taking: Reported on 11/08/2020)     atorvastatin (LIPITOR) 40 MG tablet Take by mouth.     carboxymethylcellulose (REFRESH PLUS) 0.5 % SOLN Apply to eye. (Patient not taking: Reported on 11/08/2020)     furosemide (LASIX) 40 MG tablet Take 1 tablet (40 mg total) by mouth daily. (Patient not taking: Reported on 11/08/2020) 30 tablet 0   isosorbide mononitrate (IMDUR) 30 MG 24 hr tablet Take 1 tablet (30 mg total) by mouth daily. (Patient not taking: Reported on 04/15/2020) 30 tablet 0   lisinopril (ZESTRIL) 5 MG tablet Take 1 tablet (5 mg total) by mouth daily. (Patient not taking: Reported on 11/08/2020) 30 tablet 0   sildenafil (VIAGRA) 100 MG tablet Take 100 mg by mouth daily as needed.     spironolactone (ALDACTONE) 25 MG  tablet Take 0.5 tablets (12.5 mg total) by mouth daily. (Patient not taking: No sig reported) 15 tablet 0   temazepam (RESTORIL) 15 MG capsule Take 15 mg by mouth at bedtime as needed. (Patient not taking: Reported on 11/08/2020)     vitamin B-12 (CYANOCOBALAMIN) 250 MCG tablet Take 250 mcg by mouth as needed. (Patient not taking: Reported on 05/09/2021)     No current facility-administered medications for this visit.    Past Medical History:  Diagnosis Date   Bursitis    Elevated lipids    Hypertension    SOB (shortness of breath)     Past Surgical History:  Procedure Laterality Date   basil cell carcinoma Left    Cheek   CAROTID ENDARTERECTOMY Right    COLONOSCOPY WITH PROPOFOL N/A 08/20/2016   Procedure: COLONOSCOPY WITH PROPOFOL;  Surgeon: Manya Silvas, MD;  Location: [facility];  Service: Endoscopy;  Laterality: N/A;   HERNIA REPAIR     1970  (Bilateral Inguinal Hernia)   LEFT HEART CATH AND CORONARY ANGIOGRAPHY N/A 03/07/2020   Procedure: LEFT HEART CATH AND CORONARY ANGIOGRAPHY;  Surgeon: Corey Skains, MD;  Location: [facility];  Service: Cardiovascular;  Laterality: N/A;   SHOULDER INJECTION     THYROIDECTOMY, PARTIAL     VASCULAR SURGERY     RT Carotid  Endarterectomy     Social History   Tobacco Use   Smoking status: Never   Smokeless tobacco: Never  Vaping Use   Vaping Use: Never used  Substance Use Topics   Alcohol use: No   Drug use: No      Family History  Problem Relation Age of Onset   Stroke Mother    Cancer Father      Allergies  Allergen Reactions   Crestor [Rosuvastatin Calcium] Other (See Comments)    Muscle pain   Rosuvastatin     Other reaction(s): Muscle Pain     REVIEW OF SYSTEMS (Negative unless checked)  Constitutional: '[]'$ Weight loss  '[]'$ Fever  '[]'$ Chills Cardiac: '[]'$ Chest pain   '[]'$ Chest pressure   '[]'$ Palpitations   '[]'$ Shortness of breath when laying flat   '[]'$ Shortness of breath at rest   '[]'$ Shortness of breath  with exertion. Vascular:  '[]'$ Pain in legs with walking   '[]'$ Pain in legs at rest   '[]'$ Pain in legs when laying flat   '[]'$ Claudication   '[]'$ Pain in feet when walking  '[]'$ Pain in feet at rest  '[]'$ Pain in feet when laying flat   '[]'$ History of DVT   '[]'$ Phlebitis   '[]'$ Swelling in legs   '[]'$ Varicose veins   '[]'$ Non-healing ulcers Pulmonary:   '[]'$ Uses home oxygen   '[]'$ Productive cough   '[]'$ Hemoptysis   '[]'$ Wheeze  '[]'$ COPD   '[]'$ Asthma Neurologic:  '[]'$ Dizziness  '[]'$ Blackouts   '[]'$ Seizures   '[]'$ History of stroke   '[]'$ History of TIA  '[]'$ Aphasia   '[]'$ Temporary blindness   '[]'$ Dysphagia   '[]'$ Weakness or numbness in arms   '[]'$ Weakness or numbness in legs Musculoskeletal:  '[x]'$ Arthritis   '[]'$ Joint swelling   '[]'$ Joint pain   '[]'$ Low back pain Hematologic:  '[]'$ Easy bruising  '[]'$ Easy bleeding   '[]'$ Hypercoagulable state   '[]'$ Anemic   Gastrointestinal:  '[]'$ Blood in stool   '[]'$ Vomiting blood  '[]'$ Gastroesophageal reflux/heartburn   '[]'$ Abdominal pain Genitourinary:  '[]'$ Chronic kidney disease   '[]'$ Difficult urination  '[]'$ Frequent urination  '[]'$ Burning with urination   '[]'$ Hematuria Skin:  '[]'$ Rashes   '[]'$ Ulcers   '[]'$ Wounds Psychological:  '[]'$ History of anxiety   '[]'$  History of major depression.  Physical Examination  BP 134/74 (BP Location: Left Arm)   Pulse 74   Resp 16   Wt 171 lb 6.4 oz (77.7 kg)   BMI 24.59 kg/m  Gen:  WD/WN, NAD. Appears younger than stated age. Head: /AT, No temporalis wasting. Ear/Nose/Throat: Hearing grossly intact, nares w/o erythema or drainage Eyes: Conjunctiva clear. Sclera non-icteric Neck: Supple.  Trachea midline Pulmonary:  Good air movement, no use of accessory muscles.  Cardiac: RRR, no JVD Vascular:  Vessel Right Left  Radial Palpable Palpable               Musculoskeletal: M/S 5/5 throughout.  No deformity or atrophy. No edema. Neurologic: Sensation grossly intact in extremities.  Symmetrical.  Speech is fluent.  Psychiatric: Judgment intact, Mood & affect appropriate for pt's clinical situation. Dermatologic: No rashes  or ulcers noted.  No cellulitis or open wounds.      Labs No results found for this or any previous visit (from the past 2160 hour(s)).  Radiology No results found.  Assessment/Plan Essential hypertension blood pressure control important in reducing the progression of atherosclerotic disease. On appropriate oral medications.     HLD (hyperlipidemia) lipid control important in reducing the progression of atherosclerotic disease. Continue statin therapy  Carotid stenosis Carotid duplex today reveals a widely patent right carotid endarterectomy and velocities just into the 40  to 59% range on the left which has a slight progression from his last study.  Continue current medical regimen.  Recheck in 6 months.    Leotis Pain, MD  05/08/2022 3:47 PM    This note was created with Dragon medical transcription system.  Any errors from dictation are purely unintentional

## 2022-11-06 ENCOUNTER — Ambulatory Visit (INDEPENDENT_AMBULATORY_CARE_PROVIDER_SITE_OTHER): Payer: Medicare Other | Admitting: Vascular Surgery

## 2022-11-06 ENCOUNTER — Ambulatory Visit (INDEPENDENT_AMBULATORY_CARE_PROVIDER_SITE_OTHER): Payer: Medicare Other

## 2022-11-06 VITALS — BP 152/83 | HR 70 | Resp 16 | Wt 175.0 lb

## 2022-11-06 DIAGNOSIS — I6523 Occlusion and stenosis of bilateral carotid arteries: Secondary | ICD-10-CM

## 2022-11-06 DIAGNOSIS — I10 Essential (primary) hypertension: Secondary | ICD-10-CM | POA: Diagnosis not present

## 2022-11-06 DIAGNOSIS — E785 Hyperlipidemia, unspecified: Secondary | ICD-10-CM | POA: Diagnosis not present

## 2022-11-06 NOTE — Assessment & Plan Note (Signed)
Carotid duplex reveals a patent right carotid endarterectomy and stable 40 to 59% left ICA stenosis.  Doing well.  No change in medications.  Recheck in 6 months.

## 2022-11-06 NOTE — Progress Notes (Signed)
MRN : [id number]  Andre Murphy is a 81 y.o. (1941/09/12) male who presents with chief complaint of  Chief Complaint  Patient presents with   Follow-up    Ultrasound follow up  .  History of Present Illness: Patient returns today in follow up of his carotid disease.  He is doing well today.  He denies any focal neurologic symptoms. Specifically, the patient denies amaurosis fugax, speech or swallowing difficulties, or arm or leg weakness or numbness.  He had COVID about 2 months ago and this really threw him for a loop but he required him to be hospitalized.  It took him several weeks to recover completely.  Carotid duplex today reveals a patent right carotid endarterectomy without significant recurrent stenosis and stable 40 to 59% left ICA stenosis.  Current Outpatient Medications  Medication Sig Dispense Refill   aspirin EC 81 MG tablet Take 81 mg by mouth daily.     Cyanocobalamin (B-12) 100 MCG TABS Take by mouth.     metoprolol tartrate (LOPRESSOR) 50 MG tablet Take 1 tablet (50 mg total) by mouth 2 (two) times daily. 60 tablet 0   sildenafil (VIAGRA) 100 MG tablet Take 100 mg by mouth daily as needed.     Acetaminophen 325 MG CAPS Take by mouth. (Patient not taking: Reported on 11/08/2020)     amiodarone (PACERONE) 200 MG tablet Take 400 mg by mouth three times daily through 4/29.  On, 4/30 reduce to '200mg'$  by mouth daily (Patient not taking: Reported on 11/08/2020)     atorvastatin (LIPITOR) 40 MG tablet Take by mouth.     carboxymethylcellulose (REFRESH PLUS) 0.5 % SOLN Apply to eye. (Patient not taking: Reported on 11/08/2020)     furosemide (LASIX) 40 MG tablet Take 1 tablet (40 mg total) by mouth daily. (Patient not taking: Reported on 11/08/2020) 30 tablet 0   isosorbide mononitrate (IMDUR) 30 MG 24 hr tablet Take 1 tablet (30 mg total) by mouth daily. (Patient not taking: Reported on 04/15/2020) 30 tablet 0   lisinopril (ZESTRIL) 5 MG tablet Take 1 tablet (5 mg total) by mouth  daily. (Patient not taking: Reported on 11/08/2020) 30 tablet 0   spironolactone (ALDACTONE) 25 MG tablet Take 0.5 tablets (12.5 mg total) by mouth daily. (Patient not taking: No sig reported) 15 tablet 0   temazepam (RESTORIL) 15 MG capsule Take 15 mg by mouth at bedtime as needed. (Patient not taking: Reported on 11/08/2020)     vitamin B-12 (CYANOCOBALAMIN) 250 MCG tablet Take 250 mcg by mouth as needed. (Patient not taking: Reported on 05/09/2021)     No current facility-administered medications for this visit.    Past Medical History:  Diagnosis Date   Bursitis    Elevated lipids    Hypertension    SOB (shortness of breath)     Past Surgical History:  Procedure Laterality Date   basil cell carcinoma Left    Cheek   CAROTID ENDARTERECTOMY Right    COLONOSCOPY WITH PROPOFOL N/A 08/20/2016   Procedure: COLONOSCOPY WITH PROPOFOL;  Surgeon: Manya Silvas, MD;  Location: [facility];  Service: Endoscopy;  Laterality: N/A;   HERNIA REPAIR     1970  (Bilateral Inguinal Hernia)   LEFT HEART CATH AND CORONARY ANGIOGRAPHY N/A 03/07/2020   Procedure: LEFT HEART CATH AND CORONARY ANGIOGRAPHY;  Surgeon: Corey Skains, MD;  Location: [facility];  Service: Cardiovascular;  Laterality: N/A;   SHOULDER INJECTION     THYROIDECTOMY, PARTIAL  VASCULAR SURGERY     RT Carotid Endarterectomy     Social History   Tobacco Use   Smoking status: Never   Smokeless tobacco: Never  Vaping Use   Vaping Use: Never used  Substance Use Topics   Alcohol use: No   Drug use: No       Family History  Problem Relation Age of Onset   Stroke Mother    Cancer Father      Allergies  Allergen Reactions   Crestor [Rosuvastatin Calcium] Other (See Comments)    Muscle pain   Rosuvastatin     Other reaction(s): Muscle Pain     REVIEW OF SYSTEMS (Negative unless checked)  Constitutional: '[]'$ Weight loss  '[]'$ Fever  '[]'$ Chills Cardiac: '[]'$ Chest pain   '[]'$ Chest pressure    '[]'$ Palpitations   '[]'$ Shortness of breath when laying flat   '[]'$ Shortness of breath at rest   '[x]'$ Shortness of breath with exertion. Vascular:  '[]'$ Pain in legs with walking   '[]'$ Pain in legs at rest   '[]'$ Pain in legs when laying flat   '[]'$ Claudication   '[]'$ Pain in feet when walking  '[]'$ Pain in feet at rest  '[]'$ Pain in feet when laying flat   '[]'$ History of DVT   '[]'$ Phlebitis   '[]'$ Swelling in legs   '[]'$ Varicose veins   '[]'$ Non-healing ulcers Pulmonary:   '[]'$ Uses home oxygen   '[]'$ Productive cough   '[]'$ Hemoptysis   '[]'$ Wheeze  '[]'$ COPD   '[]'$ Asthma Neurologic:  '[]'$ Dizziness  '[]'$ Blackouts   '[]'$ Seizures   '[]'$ History of stroke   '[]'$ History of TIA  '[]'$ Aphasia   '[]'$ Temporary blindness   '[]'$ Dysphagia   '[]'$ Weakness or numbness in arms   '[]'$ Weakness or numbness in legs Musculoskeletal:  '[x]'$ Arthritis   '[]'$ Joint swelling   '[]'$ Joint pain   '[]'$ Low back pain Hematologic:  '[]'$ Easy bruising  '[]'$ Easy bleeding   '[]'$ Hypercoagulable state   '[]'$ Anemic   Gastrointestinal:  '[]'$ Blood in stool   '[]'$ Vomiting blood  '[]'$ Gastroesophageal reflux/heartburn   '[]'$ Abdominal pain Genitourinary:  '[]'$ Chronic kidney disease   '[]'$ Difficult urination  '[]'$ Frequent urination  '[]'$ Burning with urination   '[]'$ Hematuria Skin:  '[]'$ Rashes   '[]'$ Ulcers   '[]'$ Wounds Psychological:  '[]'$ History of anxiety   '[]'$  History of major depression.  Physical Examination  BP (!) 152/83 (BP Location: Left Arm)   Pulse 70   Resp 16   Wt 175 lb (79.4 kg)   BMI 25.11 kg/m  Gen:  WD/WN, NAD. Appears younger than stated age Head: [place]/AT, No temporalis wasting. Ear/Nose/Throat: Hearing grossly intact, nares w/o erythema or drainage Eyes: Conjunctiva clear. Sclera non-icteric Neck: Supple.  Trachea midline Pulmonary:  Good air movement, no use of accessory muscles.  Cardiac: RRR, no JVD Vascular:  Vessel Right Left  Radial Palpable Palpable               Musculoskeletal: M/S 5/5 throughout.  No deformity or atrophy. No edema. Neurologic: Sensation grossly intact in extremities.  Symmetrical.  Speech is fluent.   Psychiatric: Judgment intact, Mood & affect appropriate for pt's clinical situation. Dermatologic: No rashes or ulcers noted.  No cellulitis or open wounds.      Labs No results found for this or any previous visit (from the past 2160 hour(s)).  Radiology No results found.  Assessment/Plan Essential hypertension blood pressure control important in reducing the progression of atherosclerotic disease. On appropriate oral medications.     HLD (hyperlipidemia) lipid control important in reducing the progression of atherosclerotic disease. Continue statin therapy  Carotid stenosis Carotid duplex reveals a patent right carotid endarterectomy  and stable 40 to 59% left ICA stenosis.  Doing well.  No change in medications.  Recheck in 6 months.    Leotis Pain, MD  11/06/2022 2:52 PM    This note was created with Dragon medical transcription system.  Any errors from dictation are purely unintentional

## 2023-03-27 ENCOUNTER — Encounter: Payer: Self-pay | Admitting: *Deleted

## 2023-03-28 ENCOUNTER — Encounter: Payer: Self-pay | Admitting: *Deleted

## 2023-04-03 ENCOUNTER — Telehealth: Payer: Self-pay

## 2023-04-03 NOTE — Telephone Encounter (Signed)
Made appointment 04/17/2023

## 2023-04-03 NOTE — Telephone Encounter (Signed)
Pt LVMM to schedule colonoscopy please return call

## 2023-04-03 NOTE — Telephone Encounter (Signed)
I can see him on 5/21 or 5/22  RV

## 2023-04-03 NOTE — Telephone Encounter (Signed)
Received referral for patient to have his colonoscopy.  He does have heart problems.  His next appt with Dr. Gwen Pounds is 04/24/23.  Asked if he was having any GI issues or had a family history of colon cancer.  He does not have a family history of colon cancer.  Last colonoscopy 08/20/16 with Dr. Mechele Collin polyps were present. Informed him that we generally stop screening for colon cancer at age 82  however there are exceptions to this.  He said he was more concerned about his hemorrhoids and would like to be seen to discuss getting his hemorrhoids removed.  Patient will be scheduled an office visit to discuss with Dr. Allegra Lai.  Office visit 07/22/23 at 2:30pm.  Thanks, [employer]

## 2023-04-17 ENCOUNTER — Encounter: Payer: Self-pay | Admitting: Gastroenterology

## 2023-04-17 ENCOUNTER — Other Ambulatory Visit: Payer: Self-pay

## 2023-04-17 ENCOUNTER — Ambulatory Visit (INDEPENDENT_AMBULATORY_CARE_PROVIDER_SITE_OTHER): Payer: Medicare Other | Admitting: Gastroenterology

## 2023-04-17 VITALS — BP 120/72 | HR 62 | Temp 98.1°F | Ht 70.0 in | Wt 178.5 lb

## 2023-04-17 DIAGNOSIS — Z86010 Personal history of colonic polyps: Secondary | ICD-10-CM | POA: Diagnosis not present

## 2023-04-17 DIAGNOSIS — K642 Third degree hemorrhoids: Secondary | ICD-10-CM | POA: Diagnosis not present

## 2023-04-17 MED ORDER — NA SULFATE-K SULFATE-MG SULF 17.5-3.13-1.6 GM/177ML PO SOLN: 354.0000 mL | Freq: Once | ORAL | 0 refills | Status: AC

## 2023-04-17 NOTE — Progress Notes (Signed)
PROCEDURE NOTE: The patient presents with symptomatic grade 4 hemorrhoids, unresponsive to maximal medical therapy, requesting rubber band ligation of his/her hemorrhoidal disease.  All risks, benefits and alternative forms of therapy were described and informed consent was obtained.   The decision was made to band the RP internal hemorrhoid, and the [facility] System was used to perform band ligation without complication.  Digital anorectal examination was then performed to assure proper positioning of the band, and to adjust the banded tissue as required.  The patient was discharged home without pain or other issues.  Dietary and behavioral recommendations were given and (if necessary - prescriptions were given), along with follow-up instructions.  The patient will return  as needed for follow-up and possible additional banding as required.  No complications were encountered and the patient tolerated the procedure well.

## 2023-04-17 NOTE — Progress Notes (Signed)
Andre Repress, MD [address]  [address]  [place], [place] [address]  Main: [phone]  Fax: [phone]    Gastroenterology Consultation  Referring Provider:     Jerl Mina, MD Primary Care Physician:  Jerl Mina, MD Primary Gastroenterologist:  Dr. Arlyss Murphy Reason for Consultation: Prolapsed hemorrhoid        HPI:   Andre Murphy is a 81 y.o. male referred by Dr. Jerl Mina, MD  for consultation & management of prolapsed hemorrhoid.  Patient reports protrusion of soft tissue after a bowel movement and he has to push back in.  This has been ongoing for about an ER, sometimes interferes with cleaning after a bowel movement.  He denies any rectal pain, itching, discomfort, bleeding.  Reports having regular bowel movements.  He is functionally independent, very active  NSAIDs: None  Antiplts/Anticoagulants/Anti thrombotics: None  GI Procedures: Colonoscopy, personal history of colon polyps 08/20/2016 DIAGNOSIS:  A. COLON POLYP, CECUM; COLD BIOPSY:  - TUBULAR ADENOMA.  - NEGATIVE FOR HIGH GRADE DYSPLASIA AND MALIGNANCY.   B. COLON POLYP, SIGMOID; COLD BIOPSY:  - HYPERPLASTIC POLYP.  - NEGATIVE FOR DYSPLASIA AND MALIGNANCY.   C. COLON POLYP, HEPATIC FLEXURE; COLD BIOPSY:  - TUBULAR ADENOMA.  - NEGATIVE FOR HIGH GRADE DYSPLASIA AND MALIGNANCY.   D. COLON POLYP, HEPATIC FLEXURE; COLD BIOPSY:  - TUBULAR ADENOMA.  - NEGATIVE FOR HIGH GRADE DYSPLASIA AND MALIGNANCY.   E. RECTUM POLYP; COLD BIOPSY:  - HYPERPLASTIC POLYP.  - NEGATIVE FOR DYSPLASIA AND MALIGNANCY.   Past Medical History:  Diagnosis Date   Bursitis    Elevated lipids    Hypertension    SOB (shortness of breath)     Past Surgical History:  Procedure Laterality Date   basil cell carcinoma Left    Cheek   CAROTID ENDARTERECTOMY Right    COLONOSCOPY WITH PROPOFOL N/A 08/20/2016   Procedure: COLONOSCOPY WITH PROPOFOL;  Surgeon: Scot Jun, MD;  [facility]: [facility];   Service: Endoscopy;  Laterality: N/A;   HERNIA REPAIR     1970  (Bilateral Inguinal Hernia)   LEFT HEART CATH AND CORONARY ANGIOGRAPHY N/A 03/07/2020   Procedure: LEFT HEART CATH AND CORONARY ANGIOGRAPHY;  Surgeon: Lamar Blinks, MD;  Location: [facility];  Service: Cardiovascular;  Laterality: N/A;   SHOULDER INJECTION     THYROIDECTOMY, PARTIAL     VASCULAR SURGERY     RT Carotid Endarterectomy     Current Outpatient Medications:    amiodarone (PACERONE) 200 MG tablet, , Disp: , Rfl:    aspirin EC 81 MG tablet, Take 81 mg by mouth daily., Disp: , Rfl:    atorvastatin (LIPITOR) 40 MG tablet, Take by mouth., Disp: , Rfl:    carboxymethylcellulose (REFRESH PLUS) 0.5 % SOLN, Apply to eye., Disp: , Rfl:    metoprolol tartrate (LOPRESSOR) 50 MG tablet, Take 1 tablet (50 mg total) by mouth 2 (two) times daily., Disp: 60 tablet, Rfl: 0   Na Sulfate-K Sulfate-Mg Sulf 17.5-3.13-1.6 GM/177ML SOLN, Take 354 mLs by mouth once for 1 dose., Disp: 354 mL, Rfl: 0   sildenafil (VIAGRA) 100 MG tablet, Take 100 mg by mouth daily as needed., Disp: , Rfl:    temazepam (RESTORIL) 30 MG capsule, Take 30 mg by mouth at bedtime., Disp: , Rfl:    valsartan (DIOVAN) 160 MG tablet, Take 160 mg by mouth daily., Disp: , Rfl:    vitamin B-12 (CYANOCOBALAMIN) 250 MCG tablet, Take 250  mcg by mouth as needed., Disp: , Rfl:    furosemide (LASIX) 40 MG tablet, Take 1 tablet (40 mg total) by mouth daily. (Patient not taking: Reported on 11/08/2020), Disp: 30 tablet, Rfl: 0   Family History  Problem Relation Age of Onset   Stroke Mother    Cancer Father      Social History   Tobacco Use   Smoking status: Never   Smokeless tobacco: Never  Vaping Use   Vaping Use: Never used  Substance Use Topics   Alcohol use: No   Drug use: No    Allergies as of 04/17/2023 - Review Complete 04/17/2023  Allergen Reaction Noted   Crestor [rosuvastatin calcium] Other (See Comments) 08/20/2016   Rosuvastatin   06/16/2014    Review of Systems:    All systems reviewed and negative except where noted in HPI.   Physical Exam:  BP 120/72 (BP Location: Right Arm, Patient Position: Sitting, Cuff Size: Normal)   Pulse 62   Temp 98.1 F (36.7 C) (Oral)   Ht 5\' 10"  (1.778 m)   Wt 178 lb 8 oz (81 kg)   BMI 25.61 kg/m  No LMP for male patient.  General:   Alert,  Well-developed, well-nourished, pleasant and cooperative in NAD Head:  Normocephalic and atraumatic. Eyes:  Sclera clear, no icterus.   Conjunctiva pink. Ears:  Normal auditory acuity. Nose:  No deformity, discharge, or lesions. Mouth:  No deformity or lesions,oropharynx pink & moist. Neck:  Supple; no masses or thyromegaly. Lungs:  Respirations even and unlabored.  Clear throughout to auscultation.   No wheezes, crackles, or rhonchi. No acute distress. Heart:  Regular rate and rhythm; no murmurs, clicks, rubs, or gallops. Abdomen:  Normal bowel sounds. Soft, non-tender and non-distended without masses, hepatosplenomegaly or hernias noted.  No guarding or rebound tenderness.   Rectal: Small perianal skin tag, small prolapsed right posterior hemorrhoid, nontender digital rectal exam Msk:  Symmetrical without gross deformities. Good, equal movement & strength bilaterally. Pulses:  Normal pulses noted. Extremities:  No clubbing or edema.  No cyanosis. Neurologic:  Alert and oriented x3;  grossly normal neurologically. Skin:  Intact without significant lesions or rashes. No jaundice. Psych:  Alert and cooperative. Normal mood and affect.  Imaging Studies: No abdominal imaging  Assessment and Plan:   Andre Murphy is a 82 y.o. male with history of hypertension, coronary artery disease, s/p CABG, aortic valve replacement is seen in consultation for grade 3 hemorrhoid.  Patient's main concern is a prolapsed right posterior hemorrhoid without any other hemorrhoidal symptoms.  Discussed with him about hemorrhoid banding which might help to  reduce the prolapse.  Discussed about outpatient hemorrhoid ligation including risks and benefits, consent obtained Proceed with ligation of right posterior hemorrhoid  Multiple tubular adenomas of the colon based on the colonoscopy in 2017 Discussed about surveillance colonoscopy given his overall medical condition and patient is agreeable to undergo   Follow up as needed   Andre Repress, MD

## 2023-04-20 ENCOUNTER — Emergency Department
Admission: EM | Admit: 2023-04-20 | Discharge: 2023-04-20 | Disposition: A | Discharge status: Home / Self Care | Payer: Medicare Other | Attending: Emergency Medicine | Admitting: Emergency Medicine

## 2023-04-20 ENCOUNTER — Emergency Department: Payer: Medicare Other

## 2023-04-20 ENCOUNTER — Encounter: Payer: Self-pay | Admitting: Emergency Medicine

## 2023-04-20 ENCOUNTER — Other Ambulatory Visit: Payer: Self-pay

## 2023-04-20 DIAGNOSIS — R55 Syncope and collapse: Secondary | ICD-10-CM | POA: Insufficient documentation

## 2023-04-20 DIAGNOSIS — I509 Heart failure, unspecified: Secondary | ICD-10-CM | POA: Insufficient documentation

## 2023-04-20 DIAGNOSIS — Z951 Presence of aortocoronary bypass graft: Secondary | ICD-10-CM | POA: Insufficient documentation

## 2023-04-20 DIAGNOSIS — R7989 Other specified abnormal findings of blood chemistry: Secondary | ICD-10-CM | POA: Diagnosis not present

## 2023-04-20 DIAGNOSIS — D72829 Elevated white blood cell count, unspecified: Secondary | ICD-10-CM | POA: Insufficient documentation

## 2023-04-20 DIAGNOSIS — I2510 Atherosclerotic heart disease of native coronary artery without angina pectoris: Secondary | ICD-10-CM | POA: Insufficient documentation

## 2023-04-20 DIAGNOSIS — R9431 Abnormal electrocardiogram [ECG] [EKG]: Secondary | ICD-10-CM | POA: Insufficient documentation

## 2023-04-20 DIAGNOSIS — R001 Bradycardia, unspecified: Secondary | ICD-10-CM | POA: Insufficient documentation

## 2023-04-20 DIAGNOSIS — I110 Hypertensive heart disease with heart failure: Secondary | ICD-10-CM | POA: Insufficient documentation

## 2023-04-20 LAB — CBC WITH DIFFERENTIAL/PLATELET
Abs Immature Granulocytes: 0.05 10*3/uL (ref 0.00–0.07)
Basophils Absolute: 0 10*3/uL (ref 0.0–0.1)
Basophils Relative: 0 %
Eosinophils Absolute: 0.1 10*3/uL (ref 0.0–0.5)
Eosinophils Relative: 1 %
HCT: 42.8 % (ref 39.0–52.0)
Hemoglobin: 14.1 g/dL (ref 13.0–17.0)
Immature Granulocytes: 0 %
Lymphocytes Relative: 13 %
Lymphs Abs: 1.5 10*3/uL (ref 0.7–4.0)
MCH: 31 pg (ref 26.0–34.0)
MCHC: 32.9 g/dL (ref 30.0–36.0)
MCV: 94.1 fL (ref 80.0–100.0)
Monocytes Absolute: 0.9 10*3/uL (ref 0.1–1.0)
Monocytes Relative: 8 %
Neutro Abs: 9.2 10*3/uL — ABNORMAL HIGH (ref 1.7–7.7)
Neutrophils Relative %: 78 %
Platelets: 156 10*3/uL (ref 150–400)
RBC: 4.55 MIL/uL (ref 4.22–5.81)
RDW: 12.9 % (ref 11.5–15.5)
WBC: 11.8 10*3/uL — ABNORMAL HIGH (ref 4.0–10.5)
nRBC: 0 % (ref 0.0–0.2)

## 2023-04-20 LAB — COMPREHENSIVE METABOLIC PANEL
ALT: 20 U/L (ref 0–44)
AST: 21 U/L (ref 15–41)
Albumin: 3.5 g/dL (ref 3.5–5.0)
Alkaline Phosphatase: 44 U/L (ref 38–126)
Anion gap: 5 (ref 5–15)
BUN: 19 mg/dL (ref 8–23)
CO2: 28 mmol/L (ref 22–32)
Calcium: 8.6 mg/dL — ABNORMAL LOW (ref 8.9–10.3)
Chloride: 108 mmol/L (ref 98–111)
Creatinine, Ser: 1.37 mg/dL — ABNORMAL HIGH (ref 0.61–1.24)
GFR, Estimated: 52 mL/min — ABNORMAL LOW (ref >60)
Glucose, Bld: 131 mg/dL — ABNORMAL HIGH (ref 70–99)
Potassium: 4.9 mmol/L (ref 3.5–5.1)
Sodium: 141 mmol/L (ref 135–145)
Total Bilirubin: 0.6 mg/dL (ref 0.3–1.2)
Total Protein: 6.2 g/dL — ABNORMAL LOW (ref 6.5–8.1)

## 2023-04-20 LAB — TROPONIN I (HIGH SENSITIVITY)
Troponin I (High Sensitivity): 6 ng/L (ref <18)
Troponin I (High Sensitivity): 7 ng/L (ref <18)

## 2023-04-20 LAB — BRAIN NATRIURETIC PEPTIDE: B Natriuretic Peptide: 314.9 pg/mL — ABNORMAL HIGH (ref 0.0–100.0)

## 2023-04-20 LAB — ETHANOL: Alcohol, Ethyl (B): <10 mg/dL (ref <10)

## 2023-04-20 MED ORDER — SODIUM CHLORIDE 0.9 % IV BOLUS
500.0000 mL | Freq: Once | INTRAVENOUS | Status: AC
  Administered 2023-04-20: 500 mL via INTRAVENOUS

## 2023-04-20 NOTE — ED Provider Notes (Signed)
[facility] Provider Note    Event Date/Time   First MD Initiated Contact with Patient 04/20/23 1510     (approximate)   History   Loss of Consciousness   HPI  Andre Murphy is a 82 y.o. male   Past medical history of CAD/NSTEMI, CABG, hyperlipidemia, CHF, hypertension who presents emergency department with syncopal episode today.  He was in his regular state of health this morning when he went to an event that was outside in the heat and was standing up felt a warm flushed feeling lightheaded felt like he was going to faint and sat down in her chair and had a brief loss of consciousness witnessed by bystanders and firefighters.  Reportedly took his blood pressure and heart rate which was "low "and he quickly regained consciousness and felt well.  There is no reported seizure activity and no reported trauma.  The patient feels well now has a dry mouth but otherwise feels back to baseline.  He reports no other acute medical complaints recent illnesses and no other presyncopal symptoms like chest pain, palpitations, shortness of breath    Independent Historian contributed to assessment above: Son is at bedside corroborates information and past medical history as above     Physical Exam   Triage Vital Signs: ED Triage Vitals  Enc Vitals Group     BP      Pulse      Resp      Temp      Temp src      SpO2      Weight      Height      Head Circumference      Peak Flow      Pain Score      Pain Loc      Pain Edu?      Excl. in GC?     Most recent vital signs: Vitals:   04/20/23 1527  BP: 136/70  Pulse: (!) 53  Resp: 18  Temp: 97.7 F (36.5 C)  SpO2: 100%    General: Awake, no distress.  CV:  Good peripheral perfusion.  Resp:  Normal effort.  Abd:  No distention.  Other:  Awake alert comfortable appearing slightly bradycardic otherwise vital signs within normal limits.  Breathing comfortably.  Poor skin turgor and dry mucous membranes  sure that he is slightly dehydrated clinically.  Soft nontender abdomen and clear lungs.  Motor or sensory intact no facial asymmetry no dysarthria   ED Results / Procedures / Treatments   Labs (all labs ordered are listed, but only abnormal results are displayed) Labs Reviewed  COMPREHENSIVE METABOLIC PANEL - Abnormal; Notable for the following components:      Result Value   Glucose, Bld 131 (*)    Creatinine, Ser 1.37 (*)    Calcium 8.6 (*)    Total Protein 6.2 (*)    GFR, Estimated 52 (*)    All other components within normal limits  CBC WITH DIFFERENTIAL/PLATELET - Abnormal; Notable for the following components:   WBC 11.8 (*)    Neutro Abs 9.2 (*)    All other components within normal limits  ETHANOL  URINALYSIS, W/ REFLEX TO CULTURE (INFECTION SUSPECTED)  BRAIN NATRIURETIC PEPTIDE  TROPONIN I (HIGH SENSITIVITY)     I ordered and reviewed the above labs they are notable for creatinine is 1.3 which is slightly elevated from his baseline, white blood cell count slightly elevated 11.8.  Troponin negative.  EKG  ED ECG REPORT I, Pilar Jarvis, the attending physician, personally viewed and interpreted this ECG.   Date: 04/20/2023  EKG Time: 1518  Rate: 52  Rhythm: sinus bradycardia  Axis: nl  Intervals:none  ST&T Change: no stemi    RADIOLOGY I independently reviewed and interpreted chest x-ray and see no obvious focality pneumothorax   PROCEDURES:  Critical Care performed: No  Procedures   MEDICATIONS ORDERED IN ED: Medications  sodium chloride 0.9 % bolus 500 mL (500 mLs Intravenous New Bag/Given 04/20/23 1545)     IMPRESSION / MDM / ASSESSMENT AND PLAN / ED COURSE  I reviewed the triage vital signs and the nursing notes.                                Patient's presentation is most consistent with acute presentation with potential threat to life or bodily function.  Differential diagnosis includes, but is not limited to, vasovagal syncope,  dehydration, electrolyte derangement, dysrhythmia, ACS, CVA   The patient is on the cardiac monitor to evaluate for evidence of arrhythmia and/or significant heart rate changes.  MDM: Is a patient with likely vasovagal syncope also may be heat induced slight dehydration appears slightly dehydrated clinically.  He has no chest pain shortness of breath palpitations suggest other cardiogenic syncope and no neurologic deficits to suggest stroke.  No seizure activity to suggest seizure.  Given his heart history we will obtain an EKG and serial troponins to rule out ACS or dysrhythmia.  His labs show creatinine slightly elevated which is consistent with hypovolemia.  He was given 500 cc of IV crystalloid, given his dehydration but did not want to overload him given his history of CHF.  He looks well and feels well now.  My plan will be for discharge and close PMD/cardiology follow-up if his labs and serial troponin are unremarkable and he continues to feel well.         FINAL CLINICAL IMPRESSION(S) / ED DIAGNOSES   Final diagnoses:  Syncope and collapse  Vasovagal syncope     Rx / DC Orders   ED Discharge Orders     None        Note:  This document was prepared using Dragon voice recognition software and may include unintentional dictation errors.    Pilar Jarvis, MD 04/20/23 [id number]

## 2023-04-20 NOTE — Discharge Instructions (Signed)
Well-hydrated by drinking fluids.  See your heart doctor for follow-up appointment this week.  If you experience any new, worsening or unexpected symptoms call your doctor right away or come back to the emergency department.

## 2023-04-20 NOTE — ED Triage Notes (Signed)
Pt to ER via EMS from an outside event where he became too hot and had a syncopal episode.  Pt denies pain or other symptoms.  States he has not been well hydrated today and was sweating "a lot" prior to the syncope.

## 2023-05-07 ENCOUNTER — Ambulatory Visit (INDEPENDENT_AMBULATORY_CARE_PROVIDER_SITE_OTHER): Payer: Medicare Other

## 2023-05-07 ENCOUNTER — Ambulatory Visit (INDEPENDENT_AMBULATORY_CARE_PROVIDER_SITE_OTHER): Payer: Medicare Other | Admitting: Vascular Surgery

## 2023-05-07 ENCOUNTER — Encounter (INDEPENDENT_AMBULATORY_CARE_PROVIDER_SITE_OTHER): Payer: Self-pay | Admitting: Vascular Surgery

## 2023-05-07 VITALS — BP 159/78 | HR 58 | Resp 18 | Ht 70.0 in | Wt 181.2 lb

## 2023-05-07 DIAGNOSIS — I6523 Occlusion and stenosis of bilateral carotid arteries: Secondary | ICD-10-CM | POA: Diagnosis not present

## 2023-05-07 DIAGNOSIS — E785 Hyperlipidemia, unspecified: Secondary | ICD-10-CM | POA: Diagnosis not present

## 2023-05-07 DIAGNOSIS — I10 Essential (primary) hypertension: Secondary | ICD-10-CM | POA: Diagnosis not present

## 2023-05-07 NOTE — Progress Notes (Signed)
MRN : [id number]  Andre Murphy is a 82 y.o. (12-25-1940) male who presents with chief complaint of  Chief Complaint  Patient presents with   Follow-up    f/u in 6 months with carotis  .  History of Present Illness: Patient returns today in follow up of his carotid disease. He is doing well.  We talked much about sports as we always do.  He has had no focal neurologic symptoms. Specifically, the patient denies amaurosis fugax, speech or swallowing difficulties, or arm or leg weakness or numbness. His carotid duplex today shows a patent right carotid endarterectomy and stable 40 to 59% left ICA stenosis.  Current Outpatient Medications  Medication Sig Dispense Refill   amiodarone (PACERONE) 200 MG tablet      aspirin EC 81 MG tablet Take 81 mg by mouth daily.     atorvastatin (LIPITOR) 40 MG tablet Take by mouth.     carboxymethylcellulose (REFRESH PLUS) 0.5 % SOLN Apply to eye.     metoprolol tartrate (LOPRESSOR) 50 MG tablet Take 1 tablet (50 mg total) by mouth 2 (two) times daily. 60 tablet 0   Na Sulfate-K Sulfate-Mg Sulf 17.5-3.13-1.6 GM/177ML SOLN Take by mouth.     sildenafil (VIAGRA) 100 MG tablet Take 100 mg by mouth daily as needed.     temazepam (RESTORIL) 30 MG capsule Take 30 mg by mouth at bedtime.     valsartan (DIOVAN) 160 MG tablet Take 160 mg by mouth daily.     vitamin B-12 (CYANOCOBALAMIN) 250 MCG tablet Take 250 mcg by mouth as needed.     furosemide (LASIX) 40 MG tablet Take 1 tablet (40 mg total) by mouth daily. (Patient not taking: Reported on 11/08/2020) 30 tablet 0   No current facility-administered medications for this visit.    Past Medical History:  Diagnosis Date   Bursitis    Elevated lipids    Hypertension    SOB (shortness of breath)     Past Surgical History:  Procedure Laterality Date   basil cell carcinoma Left    Cheek   CAROTID ENDARTERECTOMY Right    COLONOSCOPY WITH PROPOFOL N/A 08/20/2016   Procedure: COLONOSCOPY WITH PROPOFOL;   Surgeon: Scot Jun, MD;  Location: [facility] ENDOSCOPY;  Service: Endoscopy;  Laterality: N/A;   HERNIA REPAIR     1970  (Bilateral Inguinal Hernia)   LEFT HEART CATH AND CORONARY ANGIOGRAPHY N/A 03/07/2020   Procedure: LEFT HEART CATH AND CORONARY ANGIOGRAPHY;  Surgeon: Lamar Blinks, MD;  Location: [facility] INVASIVE CV LAB;  Service: Cardiovascular;  Laterality: N/A;   SHOULDER INJECTION     THYROIDECTOMY, PARTIAL     VASCULAR SURGERY     RT Carotid Endarterectomy     Social History   Tobacco Use   Smoking status: Never   Smokeless tobacco: Never  Vaping Use   Vaping Use: Never used  Substance Use Topics   Alcohol use: No   Drug use: No       Family History  Problem Relation Age of Onset   Stroke Mother    Cancer Father      Allergies  Allergen Reactions   Crestor [Rosuvastatin Calcium] Other (See Comments)    Muscle pain   Rosuvastatin     Other reaction(s): Muscle Pain     REVIEW OF SYSTEMS (Negative unless checked)  Constitutional: [] Weight loss  [] Fever  [] Chills Cardiac: [] Chest pain   [] Chest pressure   [] Palpitations   [] Shortness of breath when  laying flat   [] Shortness of breath at rest   [x] Shortness of breath with exertion. Vascular:  [] Pain in legs with walking   [] Pain in legs at rest   [] Pain in legs when laying flat   [] Claudication   [] Pain in feet when walking  [] Pain in feet at rest  [] Pain in feet when laying flat   [] History of DVT   [] Phlebitis   [] Swelling in legs   [] Varicose veins   [] Non-healing ulcers Pulmonary:   [] Uses home oxygen   [] Productive cough   [] Hemoptysis   [] Wheeze  [] COPD   [] Asthma Neurologic:  [] Dizziness  [] Blackouts   [] Seizures   [] History of stroke   [] History of TIA  [] Aphasia   [] Temporary blindness   [] Dysphagia   [] Weakness or numbness in arms   [] Weakness or numbness in legs Musculoskeletal:  [x] Arthritis   [] Joint swelling   [] Joint pain   [] Low back pain Hematologic:  [] Easy bruising  [] Easy bleeding    [] Hypercoagulable state   [] Anemic   Gastrointestinal:  [] Blood in stool   [] Vomiting blood  [] Gastroesophageal reflux/heartburn   [] Abdominal pain Genitourinary:  [] Chronic kidney disease   [] Difficult urination  [] Frequent urination  [] Burning with urination   [] Hematuria Skin:  [] Rashes   [] Ulcers   [] Wounds Psychological:  [] History of anxiety   []  History of major depression.  Physical Examination  BP (!) 159/78 (BP Location: Left Arm)   Pulse (!) 58   Resp 18   Ht 5\' 10"  (1.778 m)   Wt 181 lb 3.2 oz (82.2 kg)   BMI 26.00 kg/m  Gen:  WD/WN, NAD. Appears younger than stated age. Head: [place]/AT, No temporalis wasting. Ear/Nose/Throat: Hearing grossly intact, nares w/o erythema or drainage Eyes: Conjunctiva clear. Sclera non-icteric Neck: Supple.  Trachea midline Pulmonary:  Good air movement, no use of accessory muscles.  Cardiac: RRR, no JVD Vascular:  Vessel Right Left  Radial Palpable Palpable               Musculoskeletal: M/S 5/5 throughout.  No deformity or atrophy. No edema. Neurologic: Sensation grossly intact in extremities.  Symmetrical.  Speech is fluent.  Psychiatric: Judgment intact, Mood & affect appropriate for pt's clinical situation. Dermatologic: No rashes or ulcers noted.  No cellulitis or open wounds.      Labs Recent Results (from the past 2160 hour(s))  Comprehensive metabolic panel     Status: Abnormal   Collection Time: 04/20/23  3:46 PM  Result Value Ref Range   Sodium 141 135 - 145 mmol/L   Potassium 4.9 3.5 - 5.1 mmol/L   Chloride 108 98 - 111 mmol/L   CO2 28 22 - 32 mmol/L   Glucose, Bld 131 (H) 70 - 99 mg/dL    Comment: Glucose reference range applies only to samples taken after fasting for at least 8 hours.   BUN 19 8 - 23 mg/dL   Creatinine, Ser 1.61 (H) 0.61 - 1.24 mg/dL   Calcium 8.6 (L) 8.9 - 10.3 mg/dL   Total Protein 6.2 (L) 6.5 - 8.1 g/dL   Albumin 3.5 3.5 - 5.0 g/dL   AST 21 15 - 41 U/L   ALT 20 0 - 44 U/L   Alkaline  Phosphatase 44 38 - 126 U/L   Total Bilirubin 0.6 0.3 - 1.2 mg/dL   GFR, Estimated 52 (L) >60 mL/min    Comment: (NOTE) Calculated using the CKD-EPI Creatinine Equation (2021)    Anion gap 5 5 - 15  Comment: Performed at [facility], [address] [place], [place] [address]  CBC with Differential     Status: Abnormal   Collection Time: 04/20/23  3:46 PM  Result Value Ref Range   WBC 11.8 (H) 4.0 - 10.5 K/uL   RBC 4.55 4.22 - 5.81 MIL/uL   Hemoglobin 14.1 13.0 - 17.0 g/dL   HCT 60.4 54.0 - 98.1 %   MCV 94.1 80.0 - 100.0 fL   MCH 31.0 26.0 - 34.0 pg   MCHC 32.9 30.0 - 36.0 g/dL   RDW 19.1 47.8 - 29.5 %   Platelets 156 150 - 400 K/uL   nRBC 0.0 0.0 - 0.2 %   Neutrophils Relative % 78 %   Neutro Abs 9.2 (H) 1.7 - 7.7 K/uL   Lymphocytes Relative 13 %   Lymphs Abs 1.5 0.7 - 4.0 K/uL   Monocytes Relative 8 %   Monocytes Absolute 0.9 0.1 - 1.0 K/uL   Eosinophils Relative 1 %   Eosinophils Absolute 0.1 0.0 - 0.5 K/uL   Basophils Relative 0 %   Basophils Absolute 0.0 0.0 - 0.1 K/uL   Immature Granulocytes 0 %   Abs Immature Granulocytes 0.05 0.00 - 0.07 K/uL    Comment: Performed at [facility], [address] [place], [place] [address]  Ethanol     Status: None   Collection Time: 04/20/23  3:46 PM  Result Value Ref Range   Alcohol, Ethyl (B) <10 <10 mg/dL    Comment: (NOTE) Lowest detectable limit for serum alcohol is 10 mg/dL.  For medical purposes only. Performed at [facility], [address] [place], [place] [address]   Troponin I (High Sensitivity)     Status: None   Collection Time: 04/20/23  3:46 PM  Result Value Ref Range   Troponin I (High Sensitivity) 6 <18 ng/L    Comment: (NOTE) Elevated high sensitivity troponin I (hsTnI) values and significant  changes across serial measurements may suggest ACS but many other  chronic and acute conditions are known to elevate hsTnI results.  Refer to the "Links" section for chest pain  algorithms and additional  guidance. Performed at [facility], [address] [place], [place] [address]   Brain natriuretic peptide     Status: Abnormal   Collection Time: 04/20/23  3:46 PM  Result Value Ref Range   B Natriuretic Peptide 314.9 (H) 0.0 - 100.0 pg/mL    Comment: Performed at [facility], [address] [place], [place] [address]  Troponin I (High Sensitivity)     Status: None   Collection Time: 04/20/23  5:32 PM  Result Value Ref Range   Troponin I (High Sensitivity) 7 <18 ng/L    Comment: (NOTE) Elevated high sensitivity troponin I (hsTnI) values and significant  changes across serial measurements may suggest ACS but many other  chronic and acute conditions are known to elevate hsTnI results.  Refer to the "Links" section for chest pain algorithms and additional  guidance. Performed at [facility], [address], [place], [place] [address]     Radiology DG Chest [place] 1 View  Result Date: 04/20/2023 CLINICAL DATA:  Fall, syncope. EXAM: PORTABLE CHEST 1 VIEW COMPARISON:  03/07/2020 FINDINGS: Median sternotomy and CABG. Heart is upper normal in size likely accentuated by low lung volumes. No pulmonary edema, focal airspace disease, pleural effusion or pneumothorax. IMPRESSION: 1. Low lung volumes without acute abnormality. 2. Median sternotomy and CABG. Electronically Signed  By: Narda Rutherford M.D.   On: 04/20/2023 15:53    Assessment/Plan Essential hypertension blood pressure control important in reducing the progression of atherosclerotic disease. On appropriate oral medications.     HLD (hyperlipidemia) lipid control important in reducing the progression of atherosclerotic disease. Continue statin therapy  Carotid stenosis His carotid duplex today shows a patent right carotid endarterectomy and stable 40 to 59% left ICA stenosis.  Doing well.  No changes.  Continue current medical regimen.  Recheck in 6  months.    Festus Barren, MD  05/07/2023 3:19 PM    This note was created with Dragon medical transcription system.  Any errors from dictation are purely unintentional

## 2023-05-07 NOTE — Assessment & Plan Note (Signed)
His carotid duplex today shows a patent right carotid endarterectomy and stable 40 to 59% left ICA stenosis.  Doing well.  No changes.  Continue current medical regimen.  Recheck in 6 months.

## 2023-05-16 DIAGNOSIS — R55 Syncope and collapse: Secondary | ICD-10-CM | POA: Insufficient documentation

## 2023-06-17 ENCOUNTER — Encounter
Admission: RE | Disposition: A | Discharge status: Home / Self Care | Payer: Self-pay | Source: Home / Self Care | Attending: Gastroenterology

## 2023-06-17 ENCOUNTER — Ambulatory Visit: Payer: Medicare Other | Admitting: Anesthesiology

## 2023-06-17 ENCOUNTER — Ambulatory Visit
Admission: RE | Admit: 2023-06-17 | Discharge: 2023-06-17 | Disposition: A | Discharge status: Home / Self Care | Payer: Medicare Other | Source: Home / Self Care | Attending: Gastroenterology | Admitting: Gastroenterology

## 2023-06-17 ENCOUNTER — Encounter: Payer: Self-pay | Admitting: Gastroenterology

## 2023-06-17 DIAGNOSIS — Z860100 Personal history of colon polyps, unspecified: Secondary | ICD-10-CM

## 2023-06-17 DIAGNOSIS — K5730 Diverticulosis of large intestine without perforation or abscess without bleeding: Secondary | ICD-10-CM | POA: Insufficient documentation

## 2023-06-17 DIAGNOSIS — D126 Benign neoplasm of colon, unspecified: Secondary | ICD-10-CM | POA: Diagnosis not present

## 2023-06-17 DIAGNOSIS — D124 Benign neoplasm of descending colon: Secondary | ICD-10-CM | POA: Insufficient documentation

## 2023-06-17 DIAGNOSIS — I509 Heart failure, unspecified: Secondary | ICD-10-CM | POA: Diagnosis not present

## 2023-06-17 DIAGNOSIS — D122 Benign neoplasm of ascending colon: Secondary | ICD-10-CM | POA: Insufficient documentation

## 2023-06-17 DIAGNOSIS — Z1211 Encounter for screening for malignant neoplasm of colon: Secondary | ICD-10-CM | POA: Insufficient documentation

## 2023-06-17 DIAGNOSIS — I252 Old myocardial infarction: Secondary | ICD-10-CM | POA: Insufficient documentation

## 2023-06-17 DIAGNOSIS — I110 Hypertensive heart disease with heart failure: Secondary | ICD-10-CM | POA: Insufficient documentation

## 2023-06-17 HISTORY — PX: POLYPECTOMY: SHX5525

## 2023-06-17 HISTORY — PX: COLONOSCOPY WITH PROPOFOL: SHX5780

## 2023-06-17 SURGERY — COLONOSCOPY WITH PROPOFOL
Anesthesia: General

## 2023-06-17 MED ORDER — SODIUM CHLORIDE 0.9 % IV SOLN
INTRAVENOUS | Status: DC
  Administered 2023-06-17: 1000 mL via INTRAVENOUS

## 2023-06-17 MED ORDER — PROPOFOL 10 MG/ML IV BOLUS
INTRAVENOUS | Status: DC | PRN
  Administered 2023-06-17: 150 ug/kg/min via INTRAVENOUS
  Administered 2023-06-17: 25 mg via INTRAVENOUS

## 2023-06-17 MED ORDER — LIDOCAINE HCL (CARDIAC) PF 100 MG/5ML IV SOSY
PREFILLED_SYRINGE | INTRAVENOUS | Status: DC | PRN
  Administered 2023-06-17: 50 mg via INTRAVENOUS

## 2023-06-17 NOTE — Anesthesia Preprocedure Evaluation (Signed)
Anesthesia Evaluation  Patient identified by MRN, date of birth, ID band Patient awake    Reviewed: Allergy & Precautions, NPO status , Patient's Chart, lab work & pertinent test results  History of Anesthesia Complications Negative for: history of anesthetic complications  Airway Mallampati: III  TM Distance: <3 FB Neck ROM: full    Dental  (+) Chipped   Pulmonary neg pulmonary ROS, neg shortness of breath   Pulmonary exam normal        Cardiovascular Exercise Tolerance: Good hypertension, (-) angina + Past MI and +CHF  Normal cardiovascular exam     Neuro/Psych negative neurological ROS  negative psych ROS   GI/Hepatic negative GI ROS, Neg liver ROS,neg GERD  ,,  Endo/Other  negative endocrine ROS    Renal/GU negative Renal ROS  negative genitourinary   Musculoskeletal   Abdominal   Peds  Hematology negative hematology ROS (+)   Anesthesia Other Findings Past Medical History: No date: Bursitis No date: Elevated lipids No date: Hypertension No date: SOB (shortness of breath)  Past Surgical History: No date: basil cell carcinoma; Left     Comment:  Cheek No date: CARDIAC CATHETERIZATION No date: CAROTID ENDARTERECTOMY; Right 08/20/2016: COLONOSCOPY WITH PROPOFOL; N/A     Comment:  Procedure: COLONOSCOPY WITH PROPOFOL;  Surgeon: Scot Jun, MD;  Location: [facility] ENDOSCOPY;  Service:               Endoscopy;  Laterality: N/A; No date: HERNIA REPAIR     Comment:  1970  (Bilateral Inguinal Hernia) 03/07/2020: LEFT HEART CATH AND CORONARY ANGIOGRAPHY; N/A     Comment:  Procedure: LEFT HEART CATH AND CORONARY ANGIOGRAPHY;                Surgeon: Lamar Blinks, MD;  Location: [facility] INVASIVE               CV LAB;  Service: Cardiovascular;  Laterality: N/A; No date: SHOULDER INJECTION No date: THYROIDECTOMY, PARTIAL No date: VASCULAR SURGERY     Comment:  RT Carotid Endarterectomy  BMI     Body Mass Index: 25.02 kg/m      Reproductive/Obstetrics negative OB ROS                             Anesthesia Physical Anesthesia Plan  ASA: 3  Anesthesia Plan: General   Post-op Pain Management:    Induction: Intravenous  PONV Risk Score and Plan: Propofol infusion and TIVA  Airway Management Planned: Natural Airway and Nasal Cannula  Additional Equipment:   Intra-op Plan:   Post-operative Plan:   Informed Consent: I have reviewed the patients History and Physical, chart, labs and discussed the procedure including the risks, benefits and alternatives for the proposed anesthesia with the patient or authorized representative who has indicated his/her understanding and acceptance.     Dental Advisory Given  Plan Discussed with: Anesthesiologist, CRNA and Surgeon  Anesthesia Plan Comments: (Patient consented for risks of anesthesia including but not limited to:  - adverse reactions to medications - risk of airway placement if required - damage to eyes, teeth, lips or other oral mucosa - nerve damage due to positioning  - sore throat or hoarseness - Damage to heart, brain, nerves, lungs, other parts of body or loss of life  Patient voiced understanding.)       Anesthesia Quick Evaluation

## 2023-06-17 NOTE — Anesthesia Postprocedure Evaluation (Signed)
Anesthesia Post Note  Patient: Andre Murphy  Procedure(s) Performed: COLONOSCOPY WITH PROPOFOL POLYPECTOMY  Patient location during evaluation: Endoscopy Anesthesia Type: General Level of consciousness: awake and alert Pain management: pain level controlled Vital Signs Assessment: post-procedure vital signs reviewed and stable Respiratory status: spontaneous breathing, nonlabored ventilation, respiratory function stable and patient connected to nasal cannula oxygen Cardiovascular status: blood pressure returned to baseline and stable Postop Assessment: no apparent nausea or vomiting Anesthetic complications: no   There were no known notable events for this encounter.   Last Vitals:  Vitals:   06/17/23 1102 06/17/23 1112  BP: (!) 127/59 136/70  Pulse: (!) 55 (!) 54  Resp: 16 18  Temp:    SpO2: 99% 100%    Last Pain:  Vitals:   06/17/23 1112  TempSrc:   PainSc: 0-No pain                 Cleda Mccreedy Shavell Nored

## 2023-06-17 NOTE — Transfer of Care (Signed)
Immediate Anesthesia Transfer of Care Note  Patient: Andre Murphy  Procedure(s) Performed: COLONOSCOPY WITH PROPOFOL POLYPECTOMY  Patient Location: PACU  Anesthesia Type:General  Level of Consciousness: drowsy  Airway & Oxygen Therapy: Patient Spontanous Breathing  Post-op Assessment: Report given to RN  Post vital signs: stable  Last Vitals:  Vitals Value Taken Time  BP    Temp    Pulse    Resp    SpO2      Last Pain:  Vitals:   06/17/23 0923  TempSrc: Temporal  PainSc: 0-No pain         Complications: No notable events documented.

## 2023-06-17 NOTE — Op Note (Signed)
[facility] Gastroenterology Patient Name: Andre Murphy Procedure Date: 06/17/2023 10:08 AM MRN: [id number] Account #: [id number] Date of Birth: [birth year] Admit Type: Outpatient Age: 82 Room: [facility] ENDO ROOM 4 Gender: Male Note Status: Finalized Instrument Name: Prentice Docker [id number] Procedure:             Colonoscopy Indications:           Surveillance: Personal history of adenomatous polyps                         on last colonoscopy 5 years ago, Last colonoscopy:                         September 2017 Providers:             Toney Reil MD, MD Referring MD:          Rhona Leavens. Burnett Sheng, MD (Referring MD) Medicines:             General Anesthesia Complications:         No immediate complications. Estimated blood loss: None. Procedure:             Pre-Anesthesia Assessment:                        - Prior to the procedure, a History and Physical was                         performed, and patient medications and allergies were                         reviewed. The patient is competent. The risks and                         benefits of the procedure and the sedation options and                         risks were discussed with the patient. All questions                         were answered and informed consent was obtained.                         Patient identification and proposed procedure were                         verified by the physician, the nurse, the                         anesthesiologist, the anesthetist and the technician                         in the pre-procedure area in the procedure room in the                         endoscopy suite. Mental Status Examination: alert and                         oriented. Airway Examination: normal oropharyngeal  airway and neck mobility. Respiratory Examination:                         clear to auscultation. CV Examination: normal.                         Prophylactic Antibiotics: The  patient does not require                         prophylactic antibiotics. Prior Anticoagulants: The                         patient has taken no anticoagulant or antiplatelet                         agents. ASA Grade Assessment: III - A patient with                         severe systemic disease. After reviewing the risks and                         benefits, the patient was deemed in satisfactory                         condition to undergo the procedure. The anesthesia                         plan was to use general anesthesia. Immediately prior                         to administration of medications, the patient was                         re-assessed for adequacy to receive sedatives. The                         heart rate, respiratory rate, oxygen saturations,                         blood pressure, adequacy of pulmonary ventilation, and                         response to care were monitored throughout the                         procedure. The physical status of the patient was                         re-assessed after the procedure.                        After obtaining informed consent, the colonoscope was                         passed under direct vision. Throughout the procedure,                         the patient's blood pressure, pulse, and oxygen  saturations were monitored continuously. The                         Colonoscope was introduced through the anus and                         advanced to the the cecum, identified by appendiceal                         orifice and ileocecal valve. The colonoscopy was                         performed without difficulty. The patient tolerated                         the procedure well. The quality of the bowel                         preparation was evaluated using the BBPS [facility] Bowel                         Preparation Scale) with scores of: Right Colon = 3,                         Transverse Colon = 3  and Left Colon = 3 (entire mucosa                         seen well with no residual staining, small fragments                         of stool or opaque liquid). The total BBPS score                         equals 9. The ileocecal valve, appendiceal orifice,                         and rectum were photographed. Findings:      The perianal and digital rectal examinations were normal. Pertinent       negatives include normal sphincter tone and no palpable rectal lesions.      Seven sessile polyps were found in the descending colon and ascending       colon. The polyps were 5 to 8 mm in size. These polyps were removed with       a cold snare. Resection and retrieval were complete. Estimated blood       loss: none.      A diminutive polyp was found in the ascending colon. The polyp was       sessile. The polyp was removed with a cold biopsy forceps. Resection and       retrieval were complete.      Multiple large-mouthed diverticula were found in the recto-sigmoid colon       and sigmoid colon.      The retroflexed view of the distal rectum and anal verge was normal and       showed no anal or rectal abnormalities. Impression:            - Seven 5 to 8 mm polyps in the descending colon and  in the ascending colon, removed with a cold snare.                         Resected and retrieved.                        - One diminutive polyp in the ascending colon, removed                         with a cold biopsy forceps. Resected and retrieved.                        - Diverticulosis in the recto-sigmoid colon and in the                         sigmoid colon.                        - The distal rectum and anal verge are normal on                         retroflexion view. Recommendation:        - Discharge patient to home (with escort).                        - Resume previous diet today.                        - Continue present medications.                        -  Await pathology results.                        - Repeat colonoscopy in 3 - 5 years for surveillance                         of multiple polyps. Procedure Code(s):     --- Professional ---                        [phone], Colonoscopy, flexible; with removal of                         tumor(s), polyp(s), or other lesion(s) by snare                         technique                        45380, 59, Colonoscopy, flexible; with biopsy, single                         or multiple Diagnosis Code(s):     --- Professional ---                        Z86.010, Personal history of colonic polyps                        D12.4, Benign neoplasm of descending colon  K57.30, Diverticulosis of large intestine without                         perforation or abscess without bleeding                        D12.2, Benign neoplasm of ascending colon CPT copyright 2022 American Medical Association. All rights reserved. The codes documented in this report are preliminary and upon coder review may  be revised to meet current compliance requirements. Dr. Libby Maw Toney Reil MD, MD 06/17/2023 10:47:52 AM This report has been signed electronically. Number of Addenda: 0 Note Initiated On: 06/17/2023 10:08 AM Scope Withdrawal Time: 0 hours 15 minutes 24 seconds  Total Procedure Duration: 0 hours 17 minutes 51 seconds  Estimated Blood Loss:  Estimated blood loss: none.      [facility] [address]

## 2023-06-17 NOTE — H&P (Signed)
Arlyss Repress, MD [address]  [address]  [place], [place] [address]  Main: [phone]  Fax: [phone] Pager: [phone]  Primary Care Physician:  Jerl Mina, MD Primary Gastroenterologist:  Dr. Arlyss Repress  Pre-Procedure History & Physical: HPI:  Andre Murphy is a 82 y.o. male is here for an colonoscopy.   Past Medical History:  Diagnosis Date   Bursitis    Elevated lipids    Hypertension    SOB (shortness of breath)     Past Surgical History:  Procedure Laterality Date   basil cell carcinoma Left    Cheek   CARDIAC CATHETERIZATION     CAROTID ENDARTERECTOMY Right    COLONOSCOPY WITH PROPOFOL N/A 08/20/2016   Procedure: COLONOSCOPY WITH PROPOFOL;  Surgeon: Scot Jun, MD;  Location: [facility] ENDOSCOPY;  Service: Endoscopy;  Laterality: N/A;   HERNIA REPAIR     1970  (Bilateral Inguinal Hernia)   LEFT HEART CATH AND CORONARY ANGIOGRAPHY N/A 03/07/2020   Procedure: LEFT HEART CATH AND CORONARY ANGIOGRAPHY;  Surgeon: Lamar Blinks, MD;  Location: [facility] INVASIVE CV LAB;  Service: Cardiovascular;  Laterality: N/A;   SHOULDER INJECTION     THYROIDECTOMY, PARTIAL     VASCULAR SURGERY     RT Carotid Endarterectomy    Prior to Admission medications   Medication Sig Start Date End Date Taking? Authorizing Provider  amiodarone (PACERONE) 200 MG tablet  03/22/20  Yes [provider]  aspirin EC 81 MG tablet Take 81 mg by mouth daily.   Yes [provider]  atorvastatin (LIPITOR) 40 MG tablet Take by mouth. 03/22/20  Yes [provider]  carboxymethylcellulose (REFRESH PLUS) 0.5 % SOLN Apply to eye.   Yes [provider]  metoprolol tartrate (LOPRESSOR) 50 MG tablet Take 1 tablet (50 mg total) by mouth 2 (two) times daily. 03/10/20  Yes Wieting, Richard, MD  Na Sulfate-K Sulfate-Mg Sulf 17.5-3.13-1.6 GM/177ML SOLN Take by mouth. 04/17/23  Yes [provider]  temazepam (RESTORIL) 30 MG capsule Take 30 mg by  mouth at bedtime.   Yes [provider]  valsartan (DIOVAN) 160 MG tablet Take 160 mg by mouth daily. 02/20/23  Yes [provider]  vitamin B-12 (CYANOCOBALAMIN) 250 MCG tablet Take 250 mcg by mouth as needed.   Yes [provider]  furosemide (LASIX) 40 MG tablet Take 1 tablet (40 mg total) by mouth daily. Patient not taking: Reported on 11/08/2020 03/10/20 03/10/21  Alford Highland, MD  sildenafil (VIAGRA) 100 MG tablet Take 100 mg by mouth daily as needed. 11/29/21   [provider]    Allergies as of 04/17/2023 - Review Complete 04/17/2023  Allergen Reaction Noted   Crestor [rosuvastatin calcium] Other (See Comments) 08/20/2016   Rosuvastatin  06/16/2014    Family History  Problem Relation Age of Onset   Stroke Mother    Cancer Father     Social History   Socioeconomic History   Marital status: Married    Spouse name: Not on file   Number of children: Not on file   Years of education: Not on file   Highest education level: Not on file  Occupational History   Not on file  Tobacco Use   Smoking status: Never   Smokeless tobacco: Never  Vaping Use   Vaping status: Never Used  Substance and Sexual Activity   Alcohol use: Yes    Comment: red wine q night   Drug use: No   Sexual activity:  Not on file  Other Topics Concern   Not on file  Social History Narrative   Not on file   Social Determinants of Health   Financial Resource Strain: Not on file  Food Insecurity: Not on file  Transportation Needs: Not on file  Physical Activity: Not on file  Stress: Not on file  Social Connections: Unknown (04/10/2022)   Received from [facility]   Social Network    Social Network: Not on file  Intimate Partner Violence: Unknown (03/02/2022)   Received from [facility]   HITS    Physically Hurt: Not on file    Insult or Talk Down To: Not on file    Threaten Physical Harm: Not on file    Scream or Curse: Not on file    Review of  Systems: See HPI, otherwise negative ROS  Physical Exam: BP (!) 149/72   Pulse (!) 53   Temp (!) 97.3 F (36.3 C) (Temporal)   Resp 16   Ht 5\' 10"  (1.778 m)   Wt 79.1 kg   SpO2 98%   BMI 25.02 kg/m  General:   Alert,  pleasant and cooperative in NAD Head:  Normocephalic and atraumatic. Neck:  Supple; no masses or thyromegaly. Lungs:  Clear throughout to auscultation.    Heart:  Regular rate and rhythm. Abdomen:  Soft, nontender and nondistended. Normal bowel sounds, without guarding, and without rebound.   Neurologic:  Alert and  oriented x4;  grossly normal neurologically.  Impression/Plan: Andre Murphy is here for a colonoscopy to be performed for h/o colon adenoma  Risks, benefits, limitations, and alternatives regarding  colonoscopy have been reviewed with the patient.  Questions have been answered.  All parties agreeable.   Lannette Donath, MD  06/17/2023, 10:14 AM

## 2023-06-18 ENCOUNTER — Encounter: Payer: Self-pay | Admitting: Gastroenterology

## 2023-06-20 ENCOUNTER — Encounter: Payer: Self-pay | Admitting: Gastroenterology

## 2023-07-22 ENCOUNTER — Ambulatory Visit: Payer: Medicare Other | Admitting: Gastroenterology

## 2023-11-05 ENCOUNTER — Ambulatory Visit (INDEPENDENT_AMBULATORY_CARE_PROVIDER_SITE_OTHER): Payer: Medicare Other | Admitting: Vascular Surgery

## 2023-11-05 ENCOUNTER — Encounter (INDEPENDENT_AMBULATORY_CARE_PROVIDER_SITE_OTHER): Payer: Medicare Other

## 2023-11-14 ENCOUNTER — Encounter (INDEPENDENT_AMBULATORY_CARE_PROVIDER_SITE_OTHER): Payer: Self-pay | Admitting: Vascular Surgery

## 2023-11-27 DIAGNOSIS — M1711 Unilateral primary osteoarthritis, right knee: Secondary | ICD-10-CM

## 2023-11-27 HISTORY — DX: Unilateral primary osteoarthritis, right knee: M17.11

## 2023-12-17 ENCOUNTER — Ambulatory Visit (INDEPENDENT_AMBULATORY_CARE_PROVIDER_SITE_OTHER): Payer: Medicare Other | Admitting: Vascular Surgery

## 2023-12-17 ENCOUNTER — Encounter (INDEPENDENT_AMBULATORY_CARE_PROVIDER_SITE_OTHER): Payer: Medicare Other

## 2023-12-31 ENCOUNTER — Ambulatory Visit (INDEPENDENT_AMBULATORY_CARE_PROVIDER_SITE_OTHER): Payer: Medicare Other

## 2023-12-31 ENCOUNTER — Encounter (INDEPENDENT_AMBULATORY_CARE_PROVIDER_SITE_OTHER): Payer: Self-pay | Admitting: Vascular Surgery

## 2023-12-31 ENCOUNTER — Ambulatory Visit (INDEPENDENT_AMBULATORY_CARE_PROVIDER_SITE_OTHER): Payer: Medicare Other | Admitting: Vascular Surgery

## 2023-12-31 VITALS — BP 200/93 | HR 65 | Resp 18 | Ht 70.0 in | Wt 177.4 lb

## 2023-12-31 DIAGNOSIS — E785 Hyperlipidemia, unspecified: Secondary | ICD-10-CM

## 2023-12-31 DIAGNOSIS — I6523 Occlusion and stenosis of bilateral carotid arteries: Secondary | ICD-10-CM | POA: Diagnosis not present

## 2023-12-31 DIAGNOSIS — I10 Essential (primary) hypertension: Secondary | ICD-10-CM

## 2023-12-31 NOTE — Progress Notes (Signed)
 MRN : [id number]  Andre Murphy is a 83 y.o. (Jan 07, 1941) male who presents with chief complaint of  Chief Complaint  Patient presents with   Follow-up    11/14/23 mail R/s letter - come in for doppler test -gd 6 month carotid + see jd    .  History of Present Illness: Patient returns today in follow up of  his carotid disease. He is doing well.  We talked much about sports as we always do.  It sounds like he may have lost his wife since his last visit.  He has had no focal neurologic symptoms. Specifically, the patient denies amaurosis fugax, speech or swallowing difficulties, or arm or leg weakness or numbness. His carotid duplex today shows a patent right carotid endarterectomy and stable 40 to 59% left ICA stenosis.   Current Outpatient Medications  Medication Sig Dispense Refill   amiodarone (PACERONE) 200 MG tablet      aspirin  EC 81 MG tablet Take 81 mg by mouth daily.     atorvastatin  (LIPITOR) 40 MG tablet Take by mouth.     carboxymethylcellulose (REFRESH PLUS) 0.5 % SOLN Apply to eye.     metoprolol  tartrate (LOPRESSOR ) 50 MG tablet Take 1 tablet (50 mg total) by mouth 2 (two) times daily. 60 tablet 0   Na Sulfate-K Sulfate-Mg Sulf 17.5-3.13-1.6 GM/177ML SOLN Take by mouth.     sildenafil (VIAGRA) 100 MG tablet Take 100 mg by mouth daily as needed.     temazepam  (RESTORIL ) 30 MG capsule Take 30 mg by mouth at bedtime.     testosterone cypionate (DEPOTESTOSTERONE CYPIONATE) 200 MG/ML injection Inject 200 mg into the muscle every 14 (fourteen) days.     triamcinolone ointment (KENALOG) 0.1 % Apply topically 2 (two) times daily as needed.     valsartan (DIOVAN) 160 MG tablet Take 160 mg by mouth daily.     vitamin B-12 (CYANOCOBALAMIN) 250 MCG tablet Take 250 mcg by mouth as needed.     furosemide  (LASIX ) 40 MG tablet Take 1 tablet (40 mg total) by mouth daily. (Patient not taking: Reported on 11/08/2020) 30 tablet 0   No current facility-administered medications for this  visit.    Past Medical History:  Diagnosis Date   Bursitis    Elevated lipids    Hypertension    SOB (shortness of breath)     Past Surgical History:  Procedure Laterality Date   basil cell carcinoma Left    Cheek   CARDIAC CATHETERIZATION     CAROTID ENDARTERECTOMY Right    COLONOSCOPY WITH PROPOFOL  N/A 08/20/2016   Procedure: COLONOSCOPY WITH PROPOFOL ;  Surgeon: Lamar ONEIDA Holmes, MD;  Location: [facility] ENDOSCOPY;  Service: Endoscopy;  Laterality: N/A;   COLONOSCOPY WITH PROPOFOL  N/A 06/17/2023   Procedure: COLONOSCOPY WITH PROPOFOL ;  Surgeon: Unk Corinn Skiff, MD;  Location: [facility] ENDOSCOPY;  Service: Gastroenterology;  Laterality: N/A;   HERNIA REPAIR     1970  (Bilateral Inguinal Hernia)   LEFT HEART CATH AND CORONARY ANGIOGRAPHY N/A 03/07/2020   Procedure: LEFT HEART CATH AND CORONARY ANGIOGRAPHY;  Surgeon: Hester Wolm PARAS, MD;  Location: [facility] INVASIVE CV LAB;  Service: Cardiovascular;  Laterality: N/A;   POLYPECTOMY  06/17/2023   Procedure: POLYPECTOMY;  Surgeon: Unk Corinn Skiff, MD;  Location: [facility] ENDOSCOPY;  Service: Gastroenterology;;   SHOULDER INJECTION     THYROIDECTOMY, PARTIAL     VASCULAR SURGERY     RT Carotid Endarterectomy     Social History   Tobacco  Use   Smoking status: Never   Smokeless tobacco: Never  Vaping Use   Vaping status: Never Used  Substance Use Topics   Alcohol  use: Yes    Comment: red wine q night   Drug use: No       Family History  Problem Relation Age of Onset   Stroke Mother    Cancer Father      Allergies  Allergen Reactions   Crestor [Rosuvastatin Calcium ] Other (See Comments)    Muscle pain   Rosuvastatin     Other reaction(s): Muscle Pain     REVIEW OF SYSTEMS (Negative unless checked)  Constitutional: [] Weight loss  [] Fever  [] Chills Cardiac: [] Chest pain   [] Chest pressure   [] Palpitations   [] Shortness of breath when laying flat   [] Shortness of breath at rest   [x] Shortness of breath with  exertion. Vascular:  [] Pain in legs with walking   [] Pain in legs at rest   [] Pain in legs when laying flat   [] Claudication   [] Pain in feet when walking  [] Pain in feet at rest  [] Pain in feet when laying flat   [] History of DVT   [] Phlebitis   [] Swelling in legs   [] Varicose veins   [] Non-healing ulcers Pulmonary:   [] Uses home oxygen   [] Productive cough   [] Hemoptysis   [] Wheeze  [] COPD   [] Asthma Neurologic:  [x] Dizziness  [] Blackouts   [] Seizures   [] History of stroke   [] History of TIA  [] Aphasia   [] Temporary blindness   [] Dysphagia   [] Weakness or numbness in arms   [] Weakness or numbness in legs Musculoskeletal:  [x] Arthritis   [] Joint swelling   [] Joint pain   [] Low back pain Hematologic:  [] Easy bruising  [] Easy bleeding   [] Hypercoagulable state   [] Anemic   Gastrointestinal:  [] Blood in stool   [] Vomiting blood  [] Gastroesophageal reflux/heartburn   [] Abdominal pain Genitourinary:  [] Chronic kidney disease   [] Difficult urination  [] Frequent urination  [] Burning with urination   [] Hematuria Skin:  [] Rashes   [] Ulcers   [] Wounds Psychological:  [] History of anxiety   []  History of major depression.  Physical Examination  BP (!) 200/93   Pulse 65   Resp 18   Ht 5' 10 (1.778 m)   Wt 177 lb 6.4 oz (80.5 kg)   BMI 25.45 kg/m  Gen:  WD/WN, NAD. Appears younger than stated age. Head: [place]/AT, No temporalis wasting. Ear/Nose/Throat: Hearing grossly intact, nares w/o erythema or drainage Eyes: Conjunctiva clear. Sclera non-icteric Neck: Supple.  Trachea midline Pulmonary:  Good air movement, no use of accessory muscles.  Cardiac: RRR, no JVD Vascular:  Vessel Right Left  Radial Palpable Palpable           Musculoskeletal: M/S 5/5 throughout.  No deformity or atrophy. No edema. Neurologic: Sensation grossly intact in extremities.  Symmetrical.  Speech is fluent.  Psychiatric: Judgment intact, Mood & affect appropriate for pt's clinical situation. Dermatologic: No rashes or  ulcers noted.  No cellulitis or open wounds.      Labs No results found for this or any previous visit (from the past 2160 hours).  Radiology No results found.  Assessment/Plan  Essential hypertension blood pressure control important in reducing the progression of atherosclerotic disease. On appropriate oral medications.     HLD (hyperlipidemia) lipid control important in reducing the progression of atherosclerotic disease. Continue statin therapy   Carotid stenosis His carotid duplex today shows a patent right carotid endarterectomy and stable 40 to 59%  left ICA stenosis.  We are now nearing 15 years s/p this right CEA. Doing well.  No changes.  Continue current medical regimen.  Recheck in 6 months.   Selinda Gu, MD  12/31/2023 3:53 PM    This note was created with Dragon medical transcription system.  Any errors from dictation are purely unintentional

## 2024-01-12 DIAGNOSIS — M1711 Unilateral primary osteoarthritis, right knee: Principal | ICD-10-CM | POA: Insufficient documentation

## 2024-01-15 NOTE — Discharge Instructions (Signed)
Instructions after Total Knee Replacement   Emmilia Sowder P. Angie Fava., M.D.    Dept. of Orthopaedics & Sports Medicine [facility] [address] [place], [place]  [address]  Phone: [phone]   Fax: [phone]       [email or web address]       DIET: Drink plenty of non-alcoholic fluids. Resume your normal diet. Include foods high in fiber.  ACTIVITY:  You may use crutches or a walker with weight-bearing as tolerated, unless instructed otherwise. You may be weaned off of the walker or crutches by your Physical Therapist.  Do NOT place pillows under the knee. Anything placed under the knee could limit your ability to straighten the knee.   Use the Bone Foam 3 times a day for 30 minutes each session to help straighten the knee. Continue doing gentle exercises. Exercising will reduce the pain and swelling, increase motion, and prevent muscle weakness.   Please continue to use the TED compression stockings for 6 weeks. You may remove the stockings at night, but should reapply them in the morning. Do not drive or operate any equipment until instructed.  WOUND CARE:  The initial dressing (Aquacel) can remain in place for 7 days (see separate instructions). Continue to use the PolarCare or ice packs periodically to reduce pain and swelling. You may bathe or shower after the staples are removed at the first office visit following surgery.  MEDICATIONS: You may resume your regular medications. Please take the pain medication as prescribed on the medication. Do not take pain medication on an empty stomach. Unless instructed otherwise, you should take an enteric-coated aspirin 81 mg. TWICE a day. (This along with elevation will help reduce the possibility of blood clots/phlebitis in your operated leg.) Use a stool softener (such as Senokot-S or Colace) daily and a laxative (such as Miralax or Dulcolax) as needed to prevent constipation.  Do not drive or drink alcoholic beverages when  taking pain medications.  CALL THE OFFICE FOR: Temperature above 101 degrees Excessive bleeding or drainage on the dressing. Excessive swelling, coldness, or paleness of the toes. Persistent nausea and vomiting.  FOLLOW-UP:  You should have an appointment to return to the office in 10-14 days after surgery. Arrangements have been made for continuation of Physical Therapy (either home therapy or outpatient therapy).     [facility] Department Directory         [email or web address]       [email or web address]          Cardiology  Appointments: [place] - [phone]  Endocrinology  Appointments: [place] [phone] [place] - [phone]  Gastroenterology  Appointments: [employer] [phone] [place] - [phone]        General Surgery   Appointments: [facility]  Internal Medicine/Family Medicine  Appointments: [facility] [place] - [phone] [place] - [phone]  Metabolic and Weigh Loss Surgery  Appointments: [facility]        Neurology  Appointments: [place] [phone] [place] - [phone]  Neurosurgery  Appointments: [place]  Obstetrics & Gynecology  Appointments: [place] [phone] [place] - [phone]        Pediatrics  Appointments: [place] [phone] [place] - [phone]  Physiatry  Appointments: [place] [phone]  Physical Therapy  Appointments: [place] - [phone]        Podiatry  Appointments: [place] [phone] [place] - [phone]  Pulmonology  Appointments: [facility] [facility]  Rheumatology  Appointments: [place] [phone]  [place] Location: [facility]  [address] [place], [place]  [address]  [place] Location: [facility]. [address] [place], [place]  [address]  [place]  Location: [facility] [address] [place], [place]  [address]

## 2024-01-24 ENCOUNTER — Encounter
Admission: RE | Admit: 2024-01-24 | Discharge: 2024-01-24 | Disposition: A | Discharge status: Home / Self Care | Payer: Medicare Other | Source: Ambulatory Visit | Attending: Orthopedic Surgery | Admitting: Orthopedic Surgery

## 2024-01-24 ENCOUNTER — Other Ambulatory Visit: Payer: Self-pay

## 2024-01-24 ENCOUNTER — Inpatient Hospital Stay: Admission: RE | Admit: 2024-01-24 | Payer: Medicare Other | Source: Ambulatory Visit

## 2024-01-24 VITALS — BP 148/68 | HR 64 | Resp 16 | Ht 70.0 in | Wt 178.8 lb

## 2024-01-24 DIAGNOSIS — Z01812 Encounter for preprocedural laboratory examination: Secondary | ICD-10-CM

## 2024-01-24 DIAGNOSIS — I214 Non-ST elevation (NSTEMI) myocardial infarction: Secondary | ICD-10-CM | POA: Diagnosis not present

## 2024-01-24 DIAGNOSIS — Z01818 Encounter for other preprocedural examination: Secondary | ICD-10-CM | POA: Diagnosis present

## 2024-01-24 DIAGNOSIS — I6529 Occlusion and stenosis of unspecified carotid artery: Secondary | ICD-10-CM | POA: Insufficient documentation

## 2024-01-24 DIAGNOSIS — M1711 Unilateral primary osteoarthritis, right knee: Secondary | ICD-10-CM | POA: Insufficient documentation

## 2024-01-24 DIAGNOSIS — D72829 Elevated white blood cell count, unspecified: Secondary | ICD-10-CM | POA: Diagnosis not present

## 2024-01-24 HISTORY — DX: Ischemic cardiomyopathy: I25.5

## 2024-01-24 HISTORY — DX: Atherosclerotic heart disease of native coronary artery without angina pectoris: I25.10

## 2024-01-24 HISTORY — DX: Elevated white blood cell count, unspecified: D72.829

## 2024-01-24 HISTORY — DX: Cardiac murmur, unspecified: R01.1

## 2024-01-24 HISTORY — DX: Presence of prosthetic heart valve: Z95.2

## 2024-01-24 HISTORY — DX: Malignant (primary) neoplasm, unspecified: C80.1

## 2024-01-24 HISTORY — DX: Heart failure, unspecified: I50.9

## 2024-01-24 HISTORY — DX: Unspecified systolic (congestive) heart failure: I50.20

## 2024-01-24 HISTORY — DX: Acute and chronic respiratory failure with hypoxia: J96.21

## 2024-01-24 HISTORY — DX: Thoracic aortic aneurysm, without rupture, unspecified: I71.20

## 2024-01-24 HISTORY — DX: Presence of aortocoronary bypass graft: Z95.1

## 2024-01-24 HISTORY — DX: Nonrheumatic aortic (valve) stenosis: I35.0

## 2024-01-24 LAB — COMPREHENSIVE METABOLIC PANEL
ALT: 25 U/L (ref 0–44)
AST: 24 U/L (ref 15–41)
Albumin: 3.7 g/dL (ref 3.5–5.0)
Alkaline Phosphatase: 49 U/L (ref 38–126)
Anion gap: 6 (ref 5–15)
BUN: 23 mg/dL (ref 8–23)
CO2: 30 mmol/L (ref 22–32)
Calcium: 8.9 mg/dL (ref 8.9–10.3)
Chloride: 106 mmol/L (ref 98–111)
Creatinine, Ser: 1.12 mg/dL (ref 0.61–1.24)
GFR, Estimated: >60 mL/min (ref >60)
Glucose, Bld: 68 mg/dL — ABNORMAL LOW (ref 70–99)
Potassium: 3.9 mmol/L (ref 3.5–5.1)
Sodium: 142 mmol/L (ref 135–145)
Total Bilirubin: 0.9 mg/dL (ref 0.0–1.2)
Total Protein: 7 g/dL (ref 6.5–8.1)

## 2024-01-24 LAB — URINALYSIS, ROUTINE W REFLEX MICROSCOPIC
Bilirubin Urine: NEGATIVE
Glucose, UA: NEGATIVE mg/dL
Hgb urine dipstick: NEGATIVE
Ketones, ur: NEGATIVE mg/dL
Leukocytes,Ua: NEGATIVE
Nitrite: NEGATIVE
Protein, ur: NEGATIVE mg/dL
Specific Gravity, Urine: 1.018 (ref 1.005–1.030)
pH: 5 (ref 5.0–8.0)

## 2024-01-24 LAB — CBC
HCT: 43.7 % (ref 39.0–52.0)
Hemoglobin: 14.6 g/dL (ref 13.0–17.0)
MCH: 31 pg (ref 26.0–34.0)
MCHC: 33.4 g/dL (ref 30.0–36.0)
MCV: 92.8 fL (ref 80.0–100.0)
Platelets: 167 10*3/uL (ref 150–400)
RBC: 4.71 MIL/uL (ref 4.22–5.81)
RDW: 12.1 % (ref 11.5–15.5)
WBC: 8.6 10*3/uL (ref 4.0–10.5)
nRBC: 0 % (ref 0.0–0.2)

## 2024-01-24 LAB — SEDIMENTATION RATE: Sed Rate: 5 mm/h (ref 0–20)

## 2024-01-24 LAB — SURGICAL PCR SCREEN
MRSA, PCR: NEGATIVE
Staphylococcus aureus: NEGATIVE

## 2024-01-24 LAB — C-REACTIVE PROTEIN: CRP: 0.5 mg/dL (ref <1.0)

## 2024-01-24 NOTE — Patient Instructions (Addendum)
 Your procedure is scheduled on: 01/31/24 - Friday Report to the Registration Desk on the 1st floor of the [facility]. To find out your arrival time, please call [phone] between 1PM - 3PM on: 01/30/24 - Thursday If your arrival time is 6:00 am, do not arrive before that time as the [facility] entrance doors do not open until 6:00 am.  REMEMBER: Instructions that are not followed completely may result in serious medical risk, up to and including death; or upon the discretion of your surgeon and anesthesiologist your surgery may need to be rescheduled.  Do not eat food after midnight the night before surgery.  No gum chewing or hard candies.  You may however, drink CLEAR liquids up to 2 hours before you are scheduled to arrive for your surgery. Do not drink anything within 2 hours of your scheduled arrival time.  Clear liquids include: - water  - apple juice without pulp - gatorade (not RED colors) - black coffee or tea (Do NOT add milk or creamers to the coffee or tea) Do NOT drink anything that is not on this list.   In addition, your doctor has ordered for you to drink the provided:  Ensure Pre-Surgery Clear Carbohydrate Drink  Drinking this carbohydrate drink up to two hours before surgery helps to reduce insulin resistance and improve patient outcomes. Please complete drinking 2 hours before scheduled arrival time.  One week prior to surgery: Stop Anti-inflammatories (NSAIDS) such as Advil, Aleve, Ibuprofen, Motrin, Naproxen, Naprosyn and Aspirin based products such as Excedrin, Goody's Powder, BC Powder. You may take Tylenol if needed for pain up until the day of surgery.  Stop ANY OVER THE COUNTER supplements until after surgery.  HOLD valsartan (DIOVAN) on the day of surgery.  HOLD aspirin EC 7 days prior to your surgery.  ON THE DAY OF SURGERY ONLY TAKE THESE MEDICATIONS WITH SIPS OF WATER:  atorvastatin (LIPITOR)  metoprolol tartrate    No Alcohol for 24  hours before or after surgery.  No Smoking including e-cigarettes for 24 hours before surgery.  No chewable tobacco products for at least 6 hours before surgery.  No nicotine patches on the day of surgery.  Do not use any "recreational" drugs for at least a week (preferably 2 weeks) before your surgery.  Please be advised that the combination of cocaine and anesthesia may have negative outcomes, up to and including death. If you test positive for cocaine, your surgery will be cancelled.  On the morning of surgery brush your teeth with toothpaste and water, you may rinse your mouth with mouthwash if you wish. Do not swallow any toothpaste or mouthwash.  Use CHG Soap or wipes as directed on instruction sheet.  Do not wear jewelry, make-up, hairpins, clips or nail polish.  For welded (permanent) jewelry: bracelets, anklets, waist bands, etc.  Please have this removed prior to surgery.  If it is not removed, there is a chance that hospital personnel will need to cut it off on the day of surgery.  Do not wear lotions, powders, or perfumes.   Do not shave body hair from the neck down 48 hours before surgery.  Contact lenses, hearing aids and dentures may not be worn into surgery.  Do not bring valuables to the hospital. [facility] is not responsible for any missing/lost belongings or valuables.   Notify your doctor if there is any change in your medical condition (cold, fever, infection).  Wear comfortable clothing (specific to your surgery type)  to the hospital.  After surgery, you can help prevent lung complications by doing breathing exercises.  Take deep breaths and cough every 1-2 hours. Your doctor may order a device called an Incentive Spirometer to help you take deep breaths. When coughing or sneezing, hold a pillow firmly against your incision with both hands. This is called "splinting." Doing this helps protect your incision. It also decreases belly discomfort.  If you are  being admitted to the hospital overnight, leave your suitcase in the car. After surgery it may be brought to your room.  In case of increased patient census, it may be necessary for you, the patient, to continue your postoperative care in the Same Day Surgery department.  If you are being discharged the day of surgery, you will not be allowed to drive home. You will need a responsible individual to drive you home and stay with you for 24 hours after surgery.   If you are taking public transportation, you will need to have a responsible individual with you.  Please call the Pre-admissions Testing Dept. at [phone] if you have any questions about these instructions.  Surgery Visitation Policy:  Patients having surgery or a procedure may have two visitors.  Children under the age of 40 must have an adult with them who is not the patient.  Temporary Visitor Restrictions Due to increasing cases of flu, RSV and COVID-19: Children ages 62 and under will not be able to visit patients in [facility] hospitals under most circumstances.  Inpatient Visitation:    Visiting hours are 7 a.m. to 8 p.m. Up to four visitors are allowed at one time in a patient room. The visitors may rotate out with other people during the day.  One visitor age 72 or older may stay with the patient overnight and must be in the room by 8 p.m.    Pre-operative 5 CHG Bath Instructions   You can play a key role in reducing the risk of infection after surgery. Your skin needs to be as free of germs as possible. You can reduce the number of germs on your skin by washing with CHG (chlorhexidine gluconate) soap before surgery. CHG is an antiseptic soap that kills germs and continues to kill germs even after washing.   DO NOT use if you have an allergy to chlorhexidine/CHG or antibacterial soaps. If your skin becomes reddened or irritated, stop using the CHG and notify one of our RNs at [phone].   Please shower with  the CHG soap starting 4 days before surgery using the following schedule:   03/03 - 03/07.    Please keep in mind the following:  DO NOT shave, including legs and underarms, starting the day of your first shower.   You may shave your face at any point before/day of surgery.  Place clean sheets on your bed the day you start using CHG soap. Use a clean washcloth (not used since being washed) for each shower. DO NOT sleep with pets once you start using the CHG.   CHG Shower Instructions:  If you choose to wash your hair and private area, wash first with your normal shampoo/soap.  After you use shampoo/soap, rinse your hair and body thoroughly to remove shampoo/soap residue.  Turn the water OFF and apply about 3 tablespoons (45 ml) of CHG soap to a CLEAN washcloth.  Apply CHG soap ONLY FROM YOUR NECK DOWN TO YOUR TOES (washing for 3-5 minutes)  DO NOT use CHG soap on  face, private areas, open wounds, or sores.  Pay special attention to the area where your surgery is being performed.  If you are having back surgery, having someone wash your back for you may be helpful. Wait 2 minutes after CHG soap is applied, then you may rinse off the CHG soap.  Pat dry with a clean towel  Put on clean clothes/pajamas   If you choose to wear lotion, please use ONLY the CHG-compatible lotions on the back of this paper.     Additional instructions for the day of surgery: DO NOT APPLY any lotions, deodorants, cologne, or perfumes.   Put on clean/comfortable clothes.  Brush your teeth.  Ask your nurse before applying any prescription medications to the skin.      CHG Compatible Lotions   Aveeno Moisturizing lotion  Cetaphil Moisturizing Cream  Cetaphil Moisturizing Lotion  Clairol Herbal Essence Moisturizing Lotion, Dry Skin  Clairol Herbal Essence Moisturizing Lotion, Extra Dry Skin  Clairol Herbal Essence Moisturizing Lotion, Normal Skin  Curel Age Defying Therapeutic Moisturizing Lotion with  Alpha Hydroxy  Curel Extreme Care Body Lotion  Curel Soothing Hands Moisturizing Hand Lotion  Curel Therapeutic Moisturizing Cream, Fragrance-Free  Curel Therapeutic Moisturizing Lotion, Fragrance-Free  Curel Therapeutic Moisturizing Lotion, Original Formula  Eucerin Daily Replenishing Lotion  Eucerin Dry Skin Therapy Plus Alpha Hydroxy Crme  Eucerin Dry Skin Therapy Plus Alpha Hydroxy Lotion  Eucerin Original Crme  Eucerin Original Lotion  Eucerin Plus Crme Eucerin Plus Lotion  Eucerin TriLipid Replenishing Lotion  Keri Anti-Bacterial Hand Lotion  Keri Deep Conditioning Original Lotion Dry Skin Formula Softly Scented  Keri Deep Conditioning Original Lotion, Fragrance Free Sensitive Skin Formula  Keri Lotion Fast Absorbing Fragrance Free Sensitive Skin Formula  Keri Lotion Fast Absorbing Softly Scented Dry Skin Formula  Keri Original Lotion  Keri Skin Renewal Lotion Keri Silky Smooth Lotion  Keri Silky Smooth Sensitive Skin Lotion  Nivea Body Creamy Conditioning Oil  Nivea Body Extra Enriched Lotion  Nivea Body Original Lotion  Nivea Body Sheer Moisturizing Lotion Nivea Crme  Nivea Skin Firming Lotion  NutraDerm 30 Skin Lotion  NutraDerm Skin Lotion  NutraDerm Therapeutic Skin Cream  NutraDerm Therapeutic Skin Lotion  ProShield Protective Hand Cream  Provon moisturizing lotion  How to Use an Incentive Spirometer  An incentive spirometer is a tool that measures how well you are filling your lungs with each breath. Learning to take long, deep breaths using this tool can help you keep your lungs clear and active. This may help to reverse or lessen your chance of developing breathing (pulmonary) problems, especially infection. You may be asked to use a spirometer: After a surgery. If you have a lung problem or a history of smoking. After a long period of time when you have been unable to move or be active. If the spirometer includes an indicator to show the highest number that  you have reached, your health care provider or respiratory therapist will help you set a goal. Keep a log of your progress as told by your health care provider. What are the risks? Breathing too quickly may cause dizziness or cause you to pass out. Take your time so you do not get dizzy or light-headed. If you are in pain, you may need to take pain medicine before doing incentive spirometry. It is harder to take a deep breath if you are having pain.  How to use your incentive spirometer  Sit up on the edge of your bed or on a chair.  Hold the incentive spirometer so that it is in an upright position. Before you use the spirometer, breathe out normally. Place the mouthpiece in your mouth. Make sure your lips are closed tightly around it. Breathe in slowly and as deeply as you can through your mouth, causing the piston or the ball to rise toward the top of the chamber. Hold your breath for 3-5 seconds, or for as long as possible. If the spirometer includes a coach indicator, use this to guide you in breathing. Slow down your breathing if the indicator goes above the marked areas. Remove the mouthpiece from your mouth and breathe out normally. The piston or ball will return to the bottom of the chamber. Rest for a few seconds, then repeat the steps 10 or more times. Take your time and take a few normal breaths between deep breaths so that you do not get dizzy or light-headed. Do this every 1-2 hours when you are awake. If the spirometer includes a goal marker to show the highest number you have reached (best effort), use this as a goal to work toward during each repetition. After each set of 10 deep breaths, cough a few times. This will help to make sure that your lungs are clear. If you have an incision on your chest or abdomen from surgery, place a pillow or a rolled-up towel firmly against the incision when you cough. This can help to reduce pain while taking deep breaths and coughing. General  tips When you are able to get out of bed: Walk around often. Continue to take deep breaths and cough in order to clear your lungs. Keep using the incentive spirometer until your health care provider says it is okay to stop using it. If you have been in the hospital, you may be told to keep using the spirometer at home. Contact a health care provider if: You are having difficulty using the spirometer. You have trouble using the spirometer as often as instructed. Your pain medicine is not giving enough relief for you to use the spirometer as told. You have a fever. Get help right away if: You develop shortness of breath. You develop a cough with bloody mucus from the lungs. You have fluid or blood coming from an incision site after you cough. Summary An incentive spirometer is a tool that can help you learn to take long, deep breaths to keep your lungs clear and active. You may be asked to use a spirometer after a surgery, if you have a lung problem or a history of smoking, or if you have been inactive for a long period of time. Use your incentive spirometer as instructed every 1-2 hours while you are awake. If you have an incision on your chest or abdomen, place a pillow or a rolled-up towel firmly against your incision when you cough. This will help to reduce pain. Get help right away if you have shortness of breath, you cough up bloody mucus, or blood comes from your incision when you cough. This information is not intended to replace advice given to you by your health care provider. Make sure you discuss any questions you have with your health care provider. Document Revised: 02/01/2020 Document Reviewed: 02/01/2020 Elsevier Patient Education  2023 Elsevier Inc.    POLAR CARE INFORMATION  MassAdvertisement.it  How to use [facility] Therapy System?  YouTube   ShippingScam.co.uk  OPERATING INSTRUCTIONS  Start the product With dry hands, connect  the transformer to the electrical  connection located on the top of the cooler. Next, plug the transformer into an appropriate electrical outlet. The unit will automatically start running at this point.  To stop the pump, disconnect electrical power.  Unplug to stop the product when not in use. Unplugging the Polar Care unit turns it off. Always unplug immediately after use. Never leave it plugged in while unattended. Remove pad.    FIRST ADD WATER TO FILL LINE, THEN ICE---Replace ice when existing ice is almost melted  1 Discuss Treatment with your Licensed Health Care Practitioner and Use Only as Prescribed 2 Apply Insulation Barrier & Cold Therapy Pad 3 Check for Moisture 4 Inspect Skin Regularly  Tips and Trouble Shooting Usage Tips 1. Use cubed or chunked ice for optimal performance. 2. It is recommended to drain the Pad between uses. To drain the pad, hold the Pad upright with the hose pointed toward the ground. Depress the black plunger and allow water to drain out. 3. You may disconnect the Pad from the unit without removing the pad from the affected area by depressing the silver tabs on the hose coupling and gently pulling the hoses apart. The Pad and unit will seal itself and will not leak. Note: Some dripping during release is normal. 4. DO NOT RUN PUMP WITHOUT WATER! The pump in this unit is designed to run with water. Running the unit without water will cause permanent damage to the pump. 5. Unplug unit before removing lid.  TROUBLESHOOTING GUIDE Pump not running, Water not flowing to the pad, Pad is not getting cold 1. Make sure the transformer is plugged into the wall outlet. 2. Confirm that the ice and water are filled to the indicated levels. 3. Make sure there are no kinks in the pad. 4. Gently pull on the blue tube to make sure the tube/pad junction is straight. 5. Remove the pad from the treatment site and ll it while the pad is lying at; then reapply. 6. Confirm that the  pad couplings are securely attached to the unit. Listen for the double clicks (Figure 1) to confirm the pad couplings are securely attached.  Leaks    Note: Some condensation on the lines, controller, and pads is unavoidable, especially in warmer climates. 1. If using a [employer] Polar Care Cold Therapy unit with a detachable Cold Therapy Pad, and a leak exists (other than condensation on the lines) disconnect the pad couplings. Make sure the silver tabs on the couplings are depressed before reconnecting the pad to the pump hose; then confirm both sides of the coupling are properly clicked in. 2. If the coupling continues to leak or a leak is detected in the pad itself, stop using it and call [employer] Customer Care at [phone].  Cleaning After use, empty and dry the unit with a soft cloth. Warm water and mild detergent may be used occasionally to clean the pump and tubes.  WARNING: The Polar Care Cube can be cold enough to cause serious injury, including full skin necrosis. Follow these Operating Instructions, and carefully read the Product Insert (see pouch on side of unit) and the Cold Therapy Pad Fitting Instructions (provided with each Cold Therapy Pad) prior to use.       Preoperative Educational Videos for Total Hip, Knee and Shoulder Replacements  To better prepare for surgery, please view our videos that explain the physical activity and discharge planning required to have the best surgical recovery at [facility].  [email or web address]  Questions? Call  [phone] or email [email or web address]

## 2024-01-26 NOTE — H&P (Signed)
 ORTHOPAEDIC HISTORY & PHYSICAL Andre Murphy, Adelina Mings., MD - 01/07/2024 11:00 AM EST Formatting of this note is different from the original. Images from the original note were not included. Chief Complaint: Chief Complaint Patient presents with Right knee dgenerative arthrosis  Reason for Visit: The patient is a 83 y.o. male who presents today for reevaluation of his right knee. He reports a long history of right knee pain. He localizes most of the pain along the medial aspect of the knee. He reports some swelling, no locking, and some giving way of the knee. The pain is aggravated by any weight bearing. The knee pain limits the patient's ability to ambulate long distances. The patient has not appreciated any significant improvement despite NSAIDs, viscosupplementation [facility]), and activity modification. He is not using any ambulatory aids. The patient states that the knee pain has progressed to the point that it is significantly interfering with his activities of daily living.  Medications: Current Outpatient Medications Medication Sig Dispense Refill amLODIPine (NORVASC) 10 MG tablet Take 1 tablet by mouth once daily aspirin 81 MG EC tablet Take 1 tablet (81 mg total) by mouth once daily atorvastatin (LIPITOR) 40 MG tablet take 1 tablet by mouth every day at night 90 tablet 3 carboxymethylcellulose (REFRESH TEARS) 0.5 % ophthalmic solution Place 1 drop into both eyes 2 (two) times daily as needed for Dry Eyes cyanocobalamin (VITAMIN B12) 1000 MCG tablet Take 1,000 mcg by mouth once daily ibuprofen (MOTRIN) 200 MG tablet Take 200 mg by mouth once as needed for Pain metoprolol tartrate (LOPRESSOR) 50 MG tablet take 1 tablet by mouth every 12 hours 180 tablet 3 temazepam (RESTORIL) 30 mg capsule take one capsule by mouth at bedtime 90 capsule 1 testosterone cypionate (DEPO-TESTOSTERONE) 200 mg/mL injection Inject 1 mL (200 mg total) into the muscle every 14 (fourteen) days 6 mL  1 valsartan (DIOVAN) 160 MG tablet Take 1 tablet (160 mg total) by mouth once daily 90 tablet 3  Current Facility-Administered Medications Medication Dose Route Frequency Provider Last Rate Last Admin testosterone cypionate (DEPO-TESTOSTERONE) 200 mg/mL injection 150 mg 150 mg Intramuscular Q14 Days Sandie Ano, MD 150 mg at 12/05/23 1332  Allergies: Allergies Allergen Reactions Crestor [Rosuvastatin] Muscle Pain  Past Medical History: Past Medical History: Diagnosis Date Allergic state Carotid stenosis, 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 Recall ltr mailed CORONARY ARTERY BYPASS W/SINGLE ARTERY GRAFT N/A 03/18/2020 Procedure: CORONARY ARTERY BYPASS, USING ARTERIAL GRAFT(S); SINGLE ARTERIAL GRAFT, internal mammary artery harvest; Surgeon: Wyona Almas, MD; Location: [facility]; Service: Cardiothoracic; Laterality: N/A; CORONARY ARTERY BYPASS W/VEIN ONLY N/A 03/18/2020 Procedure: CORONARY ARTERY BYPASS, USING VENOUS GRAFT(S) AND ARTERIAL GRAFT(S);SINGLE VEIN GRAFT (LIST IN ADDITION TO PRIMARY PROCEDURE); Surgeon: Wyona Almas, MD; Location: [facility]; Service: Cardiothoracic; Laterality: N/A; ENDOSCOPIC HARVEST OF VEINS FOR CORONARY ARTERY BYPASS PROCEDURE N/A 03/18/2020 Procedure: ENDOSCOPY, SURGICAL, INCLUDING VIDEO-ASSISTED HARVEST OF VEIN(S) FOR CORONARY ARTERY BYPASS PROCEDURE (LIST IN ADDITION TO PRIMARY PROCEDURE); Surgeon: Wyona Almas, MD; Location: [facility]; Service: Cardiothoracic; Laterality: N/A; REPLACEMENT AORTIC VALVE N/A  03/18/2020 Procedure: ADULT, REPLACEMENT, AORTIC VALVE, OPEN, WITH CARDIOPULMONARY BYPASS, STERNOTOMY; WITH PROSTHETIC VALVE OTHER THAN HOMOGRAFT OR STENTLESS VALVE; Surgeon: Wyona Almas, MD; Location: [facility];  Service: Cardiothoracic; Laterality: N/A; CARDIAC CATHETERIZATION  Social History: Social History  Socioeconomic History Marital status: Widowed Number of children: 5 Years of education: 53 Highest education level: Master's degree (e.g., MA, MS, MEng, MEd, MSW, MBA) Occupational History 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 Tobacco Use Smoking status: Never Passive exposure: Never Smokeless tobacco: Never Vaping Use Vaping status: Never Used Substance and Sexual Activity Alcohol use: Yes Alcohol/week: 1.0 standard drink of alcohol Types: 1 Glasses of wine per week Comment: occasional Drug use: Not Currently Sexual activity: Not Currently  Social Drivers of Health  Received from [facility] Housing Stability: Unknown (01/07/2024) Housing Stability Vital Sign Homeless in the Last Year: No  Family History: Family History Problem Relation Name Age of Onset Myocardial Infarction (Heart attack) Mother Heart disease Mother CVA Cancer Father Myocardial Infarction (Heart attack) Father  Review of Systems: A comprehensive 14 point ROS was performed, reviewed, and the pertinent orthopaedic findings are documented in the HPI.  Exam BP (!) 150/92  Ht 177.8 cm (5\' 10" )  Wt 81.1 kg (178 lb 12.8 oz)  BMI 25.66 kg/m  General: Well-developed, well-nourished male seen in no acute distress. Antalgic gait. Varus thrust to the right knee.  HEENT: Atraumatic, normocephalic. Pupils are equal and reactive to light. Extraocular motion is intact. Sclera are clear. Oropharynx is clear with moist mucosa.  Neck: Supple, nontender, and with good ROM. No thyromegaly, adenopathy, JVD, or carotid  bruits.  Lungs: Clear to auscultation bilaterally.  Cardiovascular: Regular rate and rhythm. Normal S1, S2. 3/6 murmur . No appreciable gallops or rubs. Peripheral pulses are palpable. No lower extremity edema. Homan`s test is negative.  Abdomen: Soft, nontender, nondistended. Bowel sounds are present.  Extremities: Good strength, stability, and range of motion of the upper extremities. Good range of motion of the hips and ankles.  Right Knee: Soft tissue swelling: mild Effusion: none Erythema: none Crepitance: mild Tenderness: medial Alignment: relative varus Mediolateral laxity: medial pseudolaxity Posterior sag: negative Patellar tracking: Good tracking without evidence of subluxation or tilt Atrophy: No significant atrophy. Quadriceps tone was fair to good Range of motion: 0/4/120 degrees  Neurologic: Awake, alert, and oriented. Sensory function is intact to pinprick and light touch. Motor strength is judged to be 5/5. Motor coordination is within normal limits. No apparent clonus. No tremor.  X-rays: I ordered and interpreted standing AP, lateral, and sunrise radiographs of the right knee that were obtained in the office today. There is significant narrowing of the medial cartilage space with bone-on-bone articulation and associated varus alignment. Osteophyte formation is noted. Subchondral sclerosis is noted. No evidence of fracture or dislocation.  Impression: Degenerative arthrosis of the right knee  Plan: The findings were discussed in detail with the patient. The patient was given informational material on total knee replacement. Conservative treatment options were reviewed with the patient. We discussed the risks and benefits of surgical intervention. The usual perioperative course was also discussed in detail. The patient expressed understanding of the risks and benefits of surgical intervention and would like to proceed with plans for right total knee  arthroplasty.  I spent a total of 45 minutes in both face-to-face and non-face-to-face activities, excluding procedures performed, for this visit on the date of this encounter.  MEDICAL CLEARANCE: Per anesthesiology. ACTIVITY: As tolerated. WORK STATUS: Not applicable. THERAPY: Preoperative physical therapy evaluation. MEDICATIONS: Requested Prescriptions  No prescriptions requested or ordered in this encounter  FOLLOW-UP: Return for preop History & Physical pending surgery date.  Cleburn Maiolo P. Angie Fava., M.D.  This note was generated in part with voice recognition software and I apologize for any typographical errors that were not detected and corrected.  Electronically signed by Shari Heritage., MD at 01/12/2024 7:36 AM EST

## 2024-01-29 ENCOUNTER — Encounter: Payer: Self-pay | Admitting: Orthopedic Surgery

## 2024-01-29 NOTE — Progress Notes (Signed)
 Perioperative / Anesthesia Services  Pre-Admission Testing Clinical Review / Pre-Operative Anesthesia Consult  Date: 01/30/24  Patient Demographics:  Name: Andre Murphy DOB: [birth year] MRN:   [id number]  Planned Surgical Procedure(s):    Case: [id number] Date/Time: 01/31/24 0745   Procedure: COMPUTER ASSISTED TOTAL KNEE ARTHROPLASTY - RNFA (Right: Knee)   Anesthesia type: Choice   Pre-op diagnosis: PRIMARY OSTEOARTHRITIS OF RIGHT KNEE.   Location: [facility] OR ROOM 01 / [facility]S FOR ANEST[facility]ESIA [facility]   Surgeons: Donato Heinz, MD      NOTE: Available PAT nursing documentation and vital signs have been reviewed. Clinical nursing staff has updated patient's PMH/PSHx, current medication list, and drug allergies/intolerances to ensure comprehensive history available to assist in medical decision making as it pertains to the aforementioned surgical procedure and anticipated anesthetic course. Extensive review of available clinical information personally performed. [facility] and PSH[facility] updated with any diagnoses/procedures that  may have been inadvertently omitted during his intake with the pre-admission testing department's nursing staff.  Clinical Discussion:  Andre Murphy is a 83 y.o. male who is submitted for pre-surgical anesthesia review and clearance prior to him undergoing the above procedure. Patient has never been a smoker in the past. 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   Patient is followed by cardiology Darrold Junker, MD). He was last seen in the cardiology clinic on 11/14/2023; notes reviewed. At the time of his clinic visit, patient doing well  overall from a cardiovascular perspective. Patient denied any chest pain, shortness of breath, PND, orthopnea, palpitations, significant peripheral edema, weakness, fatigue, vertiginous symptoms, or presyncope/syncope. Patient with a past medical history significant for cardiovascular diagnoses. Documented physical exam was grossly benign, providing no evidence of acute exacerbation and/or decompensation of the patient's known cardiovascular conditions.  Patient with a known history of BILATERAL carotid artery disease.  He is status post RIGHT carotid endarterectomy in 2011.  Most recent follow-up carotid duplex was performed on 12/31/2023 revealing a less than 50% stenosis of the RICA with a contralateral 40-59% stenosis of the LICA.  Patient suffered an NSTEMI on 03/05/2020.  Troponins were trended: 1053 --> 1206 --> 1630 --> 1510 --> 1501 ng/L.  TTE performed on 03/06/2020 revealed a moderately reduced left ventricular systolic function with an EF of 35-40%.  Moderate hypokinesis of the left ventricular mid apical inferoseptal wall and anteroseptal wall noted.  Borderline LVH was observed.  Diastolic Doppler parameters were normal.  Left atrium was mildly dilated.  There was mild mitral and tricuspid valve regurgitation.  Trivial aortic valve regurgitation was observed.  Aortic valve noted to be mildly stenotic with a mean transvalvular pressure gradient of 19 mmHg; AVA (VTI) = 0.68 cm.  Patient underwent diagnostic LEFT heart catheterization on 03/07/2020 revealing multivessel CAD; 100% ostial to proximal RCA, 100% distal RCA, 99% proximal-mid LCx, 85% proximal LCx, 85% mid LCx, 95% OM1, 50% proximal LAD, 90% D1, 85% mid LAD-1, and 85% mid LAD-2.  Moderate to severe aortic valve stenosis; aortic valve velocities at 3.2 m/s with a mean of 24 mm of peak of 58 mm. There was severe aortic root dilation to 5.6 cm by angiography. Given the degree and complexity of patient's coronary artery disease and aortic  stenosis, patient was referred  to CVTS for consideration of revascularization and valve replacement procedures.  Patient underwent three-vessel revascularization procedure (CABG) on 03/18/2020.  LIMA-LAD, SVG-RPDA, and SVG-OM1 bypass grafts were placed.  During the same procedure, given patient's severe aortic valve stenosis, AVR was also performed placing a 21 mm Inspiris bioprosthetic valve.  Most recent TTE was performed on 05/08/2023 revealing a low normal left ventricular systolic function with an EF of 50%.  There was mild LVH.  Left atrium severely enlarged.  There was mild mitral annular calcification.  Mild mitral, tricuspid, and pulmonary valve regurgitation noted.  Bioprosthetic aortic valve was noted to be well-seated and functioning properly; mean transvalvular gradient 15.5 mmHg.  RVSP 25.5 mmHg.  All remaining transvalvular gradients were noted to be normal providing no evidence suggestive of valvular stenosis.  Ascending aorta mildly dilated at 3.5 cm.   Blood pressure well controlled at 124/80 mmHg on currently prescribed beta-blocker (metoprolol tartrate) and ARB (valsartan) therapies.  Patient is on atorvastatin for his HLD diagnosis and ASCVD prevention. In the setting of known cardiovascular diagnoses, it is important note that patient is on exogenous testosterone injections for a male hypogonadism/erectile dysfunction diagnosis.  Patient is not diabetic.  He does not have an OSAH diagnosis.  Patient makes an effort to maintain an active lifestyle.  At the time of his cardiology visit, patient was participating in vestibular rehab.  He was also playing golf, using his stationary bike daily, and frequently exercise in the gym.  He is able to complete all of his ADLs/IADLs independently without cardiovascular limitation.  Per the [facility], patient able to exceed 4 METS of physical activity without experiencing any significant degrees of angina/anginal equivalent symptoms.  No changes were made  to his medication regimen.  Patient to follow-up with outpatient cardiology in 6 months or sooner if needed.  Andre Murphy is scheduled for an elective COMPUTER ASSISTED TOTAL KNEE ARTHROPLASTY (Right: Knee) on 01/31/2024 with Dr. Francesco Sor, MD.  Given patient's past medical history significant for cardiovascular diagnoses, presurgical cardiac clearance was sought by the PAT team. Per cardiology, "this patient is optimized for surgery and may proceed with the planned procedural course with a LOW risk of significant perioperative cardiovascular complications".  In review of the patient's chart, it is noted that he is on daily oral antithrombotic therapy. Given that patient's past medical history is significant for cardiovascular diagnoses, including but not limited to CAD, orthopedics has cleared patient to continue his daily low dose ASA throughout his perioperative course.  Patient has been updated on these directives from his specialty care providers by the PAT team.  Patient denies previous perioperative complications with anesthesia in the past. In review his EMR, it is noted that patient underwent a general anesthetic course here at [facility] in 05/2023 without documented complications.      01/24/2024   10:32 AM 01/24/2024    9:52 AM 12/31/2023    3:18 PM  Vitals with BMI  Height 5\' 10"     Weight 178 lbs 13 oz    BMI 25.65    Systolic 148  200  Diastolic 68  93  Pulse  64 65   Providers/Specialists:  NOTE: Primary physician provider listed below. Patient may have been seen by APP or partner within same practice.   PROVIDER ROLE / SPECIALTY LAST OV  Hooten, Illene Labrador, MD Orthopedics (Surgeon) 01/07/2024  Jerl Mina, MD Primary Care Provider 08/28/2023  Marcina Millard, MD Cardiology 11/14/2023  Allergies:   Allergies  Allergen Reactions   Crestor [Rosuvastatin Calcium] Other (See Comments)    Muscle pain   Current Home  Medications:   No current facility-administered medications for this encounter.    aspirin EC 81 MG tablet   atorvastatin (LIPITOR) 40 MG tablet   carboxymethylcellulose (REFRESH PLUS) 0.5 % SOLN   cyanocobalamin (VITAMIN B12) 1000 MCG tablet   metoprolol tartrate (LOPRESSOR) 50 MG tablet   temazepam (RESTORIL) 30 MG capsule   testosterone cypionate (DEPOTESTOSTERONE CYPIONATE) 200 MG/ML injection   triamcinolone ointment (KENALOG) 0.1 %   valsartan (DIOVAN) 160 MG tablet   ibuprofen (ADVIL) 400 MG tablet   History:   Past Medical History:  Diagnosis Date   Acute on chronic respiratory failure with hypoxia (HCC)    Aortic atherosclerosis (HCC)    Bilateral carotid artery disease (HCC)    a.) s/p RIGHT carotid endarterectomy 2011   Bursitis    Colon polyps    Coronary artery disease    a.) LHC 03/07/2020: 100% o-pRCA, 100% dRCA, 99% p-mLCx, 85% pLCx, 85% mLCx, 90% OM1, 50% pLAD, 90% D1, 85/85% mLAD --> CVTS; c.) s/p 3v CABG 03/18/2020 (LIMA-LAD, SVG-RPDA, SVG-OM1)   Heart murmur    HFrEF (heart failure with reduced ejection fraction) (HCC) 03/05/2020   a.) new onset 03/05/2020 in setting of NSTEMI   HLD (hyperlipidemia)    Hypertension    Insomnia    a.) on BZO PRN (temazepam)   Ischemic cardiomyopathy 03/05/2020   a.) TTE 03/06/2020: EF 35-45%; b.) LHC 03/07/2020: EF 35%; c.) TEE 03/18/2020: EF 43%; d.) TTE 06/09/2023: EF 45%; e.) TTE 05/08/2023: EF 50%   Leukocytosis    Long-term use of aspirin therapy    Male hypogonadism    a.) on exogenous TST injections (depotestosterone cypionate)   NSTEMI (non-ST elevated myocardial infarction) (HCC) 03/05/2020   a.) troponins were trended: 1053 --> 1206 --> 1630 --> 1510 --> 1501 ng/L; b.) LHC 03/07/2020: 100% o-pRCA, 100% dRCA, 99% p-mLCx, 85% pLCx, 85% mLCx, 90% OM1, 50% pLAD, 90% D1, 85/85% mLAD --> CVTS; c.) s/p 3v CABG 03/18/2020 (LIMA-LAD, SVG-RPDA, SVG-OM1)   Primary osteoarthritis of right knee 2025   S/P AVR (aortic valve  replacement) 03/18/2020   a.) 21 mm Inspiris bioprosthetic valve   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   a.) LIMA-LAD, SVG-RPDA, SVG-OM1   Severe aortic stenosis    a.) s/p AVR (21 mm Inspiris bioprosthetic valve) 03/18/2020; performed via sternotomy approach concurrently with cardiac revascularization (CABG)   Skin cancer, basal cell    SOB (shortness of breath)    Thoracic ascending aortic aneurysm (HCC) 03/07/2020   a.) LHC 03/07/2020: Ao root 5.6 cm; b.) CTA chest 03/08/2020: 4.6 cm   Thyroid nodule    a.) s/p partial thyroidectomy   Vasovagal syncope    Past Surgical History:  Procedure Laterality Date   AORTIC VALVE REPLACEMENT N/A 03/18/2020   Procedure: AORTIC VALVE REPLACEMENT; Location: [facility]; Surgeon: Flint Melter, MD   BASAL CELL CARCINOMA EXCISION Left    Cheek   CAROTID ENDARTERECTOMY Right 2011   COLONOSCOPY WITH PROPOFOL N/A 08/20/2016   Procedure: COLONOSCOPY WITH PROPOFOL;  Surgeon: Scot Jun, MD;  Location: [facility] ENDOSCOPY;  Service: Endoscopy;  Laterality: N/A;   COLONOSCOPY WITH PROPOFOL N/A 06/17/2023   Procedure: COLONOSCOPY WITH PROPOFOL;  Surgeon: Toney Reil, MD;  Location: [facility] ENDOSCOPY;  Service: Gastroenterology;  Laterality: N/A;   CORONARY ARTERY BYPASS GRAFT N/A 03/18/2020   Procedure: CORONARY ARTERY BYPASS GRAFT; Location: [facility];  Surgeon: Flint Melter, MD   INGUINAL HERNIA REPAIR Bilateral 1970   LEFT HEART CATH AND CORONARY ANGIOGRAPHY N/A 03/07/2020   Procedure: LEFT HEART CATH AND CORONARY ANGIOGRAPHY;  Surgeon: Lamar Blinks, MD;  Location: [facility] INVASIVE CV LAB;  Service: Cardiovascular;  Laterality: N/A;   POLYPECTOMY  06/17/2023   Procedure: POLYPECTOMY;  Surgeon: Toney Reil, MD;  Location: [facility] ENDOSCOPY;  Service: Gastroenterology;;   SHOULDER INJECTION     THYROIDECTOMY, PARTIAL     Family History  Problem Relation Age of Onset   Stroke Mother    Cancer Father    Social History   Tobacco Use   Smoking status: Never    Smokeless tobacco: Never  Substance Use Topics   Alcohol use: Yes    Comment: red wine q night   Pertinent Clinical Results:  LABS:  Hospital Outpatient Visit on 01/24/2024  Component Date Value Ref Range Status   CRP 01/24/2024 0.5  <1.0 mg/dL Final   Performed at [facility], [address], [place], [place] [address]   Sed Rate 01/24/2024 5  0 - 20 mm/hr Final   Performed at [facility], [address] [place], [place] [address]   WBC 01/24/2024 8.6  4.0 - 10.5 K/uL Final   RBC 01/24/2024 4.71  4.22 - 5.81 MIL/uL Final   Hemoglobin 01/24/2024 14.6  13.0 - 17.0 g/dL Final   HCT 82/95/6213 43.7  39.0 - 52.0 % Final   MCV 01/24/2024 92.8  80.0 - 100.0 fL Final   MCH 01/24/2024 31.0  26.0 - 34.0 pg Final   MCHC 01/24/2024 33.4  30.0 - 36.0 g/dL Final   RDW 08/65/7846 12.1  11.5 - 15.5 % Final   Platelets 01/24/2024 167  150 - 400 K/uL Final   nRBC 01/24/2024 0.0  0.0 - 0.2 % Final   Performed at [facility], [address] [place], [place] [address]   Sodium 01/24/2024 142  135 - 145 mmol/L Final   Potassium 01/24/2024 3.9  3.5 - 5.1 mmol/L Final   Chloride 01/24/2024 106  98 - 111 mmol/L Final   CO2 01/24/2024 30  22 - 32 mmol/L Final   Glucose, Bld 01/24/2024 68 (L)  70 - 99 mg/dL Final   Glucose reference range applies only to samples taken after fasting for at least 8 hours.   BUN 01/24/2024 23  8 - 23 mg/dL Final   Creatinine, Ser 01/24/2024 1.12  0.61 - 1.24 mg/dL Final   Calcium 28/41/3244 8.9  8.9 - 10.3 mg/dL Final   Total Protein 11/28/7251 7.0  6.5 - 8.1 g/dL Final   Albumin 66/44/0347 3.7  3.5 - 5.0 g/dL Final   AST 42/59/5638 24  15 - 41 U/L Final   ALT 01/24/2024 25  0 - 44 U/L Final   Alkaline Phosphatase 01/24/2024 49  38 - 126 U/L Final   Total Bilirubin 01/24/2024 0.9  0.0 - 1.2 mg/dL Final   GFR, Estimated 01/24/2024 >60  >60 mL/min Final   Comment: (NOTE) Calculated using the CKD-EPI Creatinine Equation (2021)    Anion gap  01/24/2024 6  5 - 15 Final   Performed at [facility], [address] [place], [place] [address]   Color, Urine 01/24/2024 YELLOW (A)  YELLOW Final   APPearance 01/24/2024 CLEAR (A)  CLEAR Final   Specific Gravity, Urine 01/24/2024 1.018  1.005 - 1.030 Final   pH 01/24/2024 5.0  5.0 - 8.0 Final  Glucose, UA 01/24/2024 NEGATIVE  NEGATIVE mg/dL Final   Hgb urine dipstick 01/24/2024 NEGATIVE  NEGATIVE Final   Bilirubin Urine 01/24/2024 NEGATIVE  NEGATIVE Final   Ketones, ur 01/24/2024 NEGATIVE  NEGATIVE mg/dL Final   Protein, ur 65/78/4696 NEGATIVE  NEGATIVE mg/dL Final   Nitrite 29/52/8413 NEGATIVE  NEGATIVE Final   Leukocytes,Ua 01/24/2024 NEGATIVE  NEGATIVE Final   Performed at [facility], [address] [place], [place] [address]   MRSA, PCR 01/24/2024 NEGATIVE  NEGATIVE Final   Staphylococcus aureus 01/24/2024 NEGATIVE  NEGATIVE Final   Comment: (NOTE) The Xpert SA Assay (FDA approved for NASAL specimens in patients 29 years of age and older), is one component of a comprehensive surveillance program. It is not intended to diagnose infection nor to guide or monitor treatment. Performed at [facility], [address] [place], [place] [address]     ECG: Date: 01/24/2024  Time ECG obtained: 1051 AM Rate: 64 bpm Rhythm: normal sinus Axis (leads I and aVF): normal Intervals: PR 202 ms. QRS 86 ms. QTc 425 ms. ST segment and T wave changes: Lateral ST/T wave abnormalities Evidence of a possible, age undetermined, prior infarct:  Yes; lateral Comparison: Similar to previous tracing obtained on 04/20/2023   IMAGING / PROCEDURES: VAS US CAROTID performed on 12/31/2023 Right Carotid: Non-hemodynamically significant plaque <50% noted in the CCA. The ECA appears <50% stenosed. The extracranial vessels were near-normal with only minimal wall thickening or plaque.  Left Carotid: Velocities in the left ICA are consistent with a 40-59% stenosis.  Non-hemodynamically significant plaque <50% noted in the CCA. The ECA appears <50% stenosed. Improved flow in the ICA compared to previous study.  Bilateral vertebral arteries demonstrate antegrade flow.  Normal flow hemodynamics were seen in bilateral subclavian arteries.   LEFT HEART CATHETERIZATION AND CORONARY ANGIOGRAPHY performed on 03/07/2020 Critical three-vessel coronary artery disease with multiple coronary atherosclerosis of multiple areas Ost RCA to Prox RCA lesion is 100% stenosed. Dist RCA lesion is 100% stenosed. Prox Cx to Mid Cx lesion is 99% stenosed. Mid Cx lesion is 85% stenosed. Prox Cx lesion is 85% stenosed. 1st Mrg lesion is 90% stenosed. Prox LAD lesion is 50% stenosed. 1st Diag lesion is 90% stenosed. Mid LAD-1 lesion is 85% stenosed. Mid LAD-2 lesion is 85% stenosed. Unable to cross valve due to significant stenoses and dilation of aortic root Assessment:  Acute non-ST elevation myocardial infarction Acute systolic dysfunction congestive heart failure Moderate to severe aortic valve stenosis Aortic valve velocities at 3.2 m/s with a mean of 24 mm of peak of 58 mm  Severe aortic root dilation Aortic root dilation of 5.6 cm by angiography    CT ANGIO CHEST AORTA W/CM &/OR WO/CM performed on 03/08/2020 Fusiform aneurysmal dilatation of the ascending thoracic aorta measuring a maximum of 4.6 cm. No dissection. Moderate calcifications at the aortic valve suggesting aortic stenosis and poststenotic dilatation of the ascending aorta. Small bilateral pleural effusions with overlying atelectasis. No infiltrates or pulmonary edema. Aortic atherosclerosis.  Impression and Plan:  Andre Murphy has been referred for pre-anesthesia review and clearance prior to him undergoing the planned anesthetic and procedural courses. Available labs, pertinent testing, and imaging results were personally reviewed by me in preparation for upcoming operative/procedural course. [facility]  Health medical record has been updated following extensive record review and patient interview with PAT staff.   This patient has been appropriately cleared by cardiology with an overall LOW risk of experiencing significant perioperative cardiovascular complications. Based on  clinical review performed today (01/30/24), barring any significant acute changes in the patient's overall condition, it is anticipated that he will be able to proceed with the planned surgical intervention. Any acute changes in clinical condition may necessitate his procedure being postponed and/or cancelled. Patient will meet with anesthesia team (MD and/or CRNA) on the day of his procedure for preoperative evaluation/assessment. Questions regarding anesthetic course will be fielded at that time.   Pre-surgical instructions were reviewed with the patient during his PAT appointment, and questions were fielded to satisfaction by PAT clinical staff. He has been instructed on which medications that he will need to hold prior to surgery, as well as the ones that have been deemed safe/appropriate to take on the day of his procedure. As part of the general education provided by PAT, patient made aware both verbally and in writing, that he would need to abstain from the use of any illegal substances during his perioperative course. He was advised that failure to follow the provided instructions could necessitate case cancellation or result in serious perioperative complications up to and including death. Patient encouraged to contact PAT and/or his surgeon's office to discuss any questions or concerns that may arise prior to surgery; verbalized understanding.   Quentin Mulling, MSN, APRN, FNP-C, CEN [facility]  Perioperative Services Nurse Practitioner Phone: [phone] Fax: [phone] 01/30/24 1:48 PM  NOTE: This note has been prepared using Dragon dictation software. Despite my best ability to proofread, there is  always the potential that unintentional transcriptional errors may still occur from this process.

## 2024-01-31 ENCOUNTER — Other Ambulatory Visit: Payer: Self-pay

## 2024-01-31 ENCOUNTER — Ambulatory Visit: Payer: Self-pay | Admitting: Urgent Care

## 2024-01-31 ENCOUNTER — Encounter
Admission: RE | Disposition: A | Discharge status: Home Health | Payer: Self-pay | Source: Ambulatory Visit | Attending: Orthopedic Surgery

## 2024-01-31 ENCOUNTER — Observation Stay
Admission: RE | Admit: 2024-01-31 | Discharge: 2024-02-01 | Disposition: A | Discharge status: Home Health | Payer: Medicare Other | Source: Ambulatory Visit | Attending: Orthopedic Surgery | Admitting: Orthopedic Surgery

## 2024-01-31 ENCOUNTER — Observation Stay

## 2024-01-31 ENCOUNTER — Encounter: Payer: Self-pay | Admitting: Orthopedic Surgery

## 2024-01-31 DIAGNOSIS — M1711 Unilateral primary osteoarthritis, right knee: Secondary | ICD-10-CM | POA: Diagnosis present

## 2024-01-31 DIAGNOSIS — I110 Hypertensive heart disease with heart failure: Secondary | ICD-10-CM | POA: Diagnosis not present

## 2024-01-31 DIAGNOSIS — I214 Non-ST elevation (NSTEMI) myocardial infarction: Secondary | ICD-10-CM

## 2024-01-31 DIAGNOSIS — Z85828 Personal history of other malignant neoplasm of skin: Secondary | ICD-10-CM | POA: Diagnosis not present

## 2024-01-31 DIAGNOSIS — I5021 Acute systolic (congestive) heart failure: Secondary | ICD-10-CM | POA: Insufficient documentation

## 2024-01-31 DIAGNOSIS — I2510 Atherosclerotic heart disease of native coronary artery without angina pectoris: Secondary | ICD-10-CM | POA: Insufficient documentation

## 2024-01-31 DIAGNOSIS — Z7982 Long term (current) use of aspirin: Secondary | ICD-10-CM | POA: Diagnosis not present

## 2024-01-31 DIAGNOSIS — Z951 Presence of aortocoronary bypass graft: Secondary | ICD-10-CM | POA: Insufficient documentation

## 2024-01-31 DIAGNOSIS — Z96651 Presence of right artificial knee joint: Secondary | ICD-10-CM

## 2024-01-31 DIAGNOSIS — D72829 Elevated white blood cell count, unspecified: Secondary | ICD-10-CM

## 2024-01-31 DIAGNOSIS — I6529 Occlusion and stenosis of unspecified carotid artery: Secondary | ICD-10-CM

## 2024-01-31 DIAGNOSIS — Z79899 Other long term (current) drug therapy: Secondary | ICD-10-CM | POA: Diagnosis not present

## 2024-01-31 HISTORY — DX: Testicular hypofunction: E29.1

## 2024-01-31 HISTORY — DX: Polyp of colon: K63.5

## 2024-01-31 HISTORY — PX: KNEE ARTHROPLASTY: SHX992

## 2024-01-31 HISTORY — DX: Atherosclerosis of aorta: I70.0

## 2024-01-31 HISTORY — DX: Nontoxic single thyroid nodule: E04.1

## 2024-01-31 HISTORY — DX: Syncope and collapse: R55

## 2024-01-31 HISTORY — DX: Nonrheumatic aortic (valve) stenosis: I35.0

## 2024-01-31 HISTORY — DX: Basal cell carcinoma of skin, unspecified: C44.91

## 2024-01-31 HISTORY — DX: Long term (current) use of aspirin: Z79.82

## 2024-01-31 HISTORY — DX: Insomnia, unspecified: G47.00

## 2024-01-31 HISTORY — DX: Hyperlipidemia, unspecified: E78.5

## 2024-01-31 HISTORY — DX: Disorder of arteries and arterioles, unspecified: I77.9

## 2024-01-31 SURGERY — ARTHROPLASTY, KNEE, TOTAL, USING IMAGELESS COMPUTER-ASSISTED NAVIGATION
Anesthesia: Spinal | Site: Knee | Laterality: Right

## 2024-01-31 MED ORDER — SODIUM CHLORIDE (PF) 0.9 % IJ SOLN
INTRAMUSCULAR | Status: AC
  Filled 2024-01-31: qty 50

## 2024-01-31 MED ORDER — IRBESARTAN 150 MG PO TABS
150.0000 mg | ORAL_TABLET | Freq: Every day | ORAL | Status: DC
  Administered 2024-01-31 – 2024-02-01 (×2): 150 mg via ORAL
  Filled 2024-01-31 (×2): qty 1

## 2024-01-31 MED ORDER — TRANEXAMIC ACID-NACL 1000-0.7 MG/100ML-% IV SOLN
INTRAVENOUS | Status: AC
  Filled 2024-01-31: qty 100

## 2024-01-31 MED ORDER — ONDANSETRON HCL 4 MG/2ML IJ SOLN
INTRAMUSCULAR | Status: AC
  Filled 2024-01-31: qty 2

## 2024-01-31 MED ORDER — BUPIVACAINE HCL (PF) 0.5 % IJ SOLN
INTRAMUSCULAR | Status: DC | PRN
  Administered 2024-01-31: 3 mL

## 2024-01-31 MED ORDER — TRAMADOL HCL 50 MG PO TABS
50.0000 mg | ORAL_TABLET | ORAL | Status: DC | PRN
  Administered 2024-01-31 – 2024-02-01 (×2): 50 mg via ORAL
  Filled 2024-01-31 (×2): qty 1

## 2024-01-31 MED ORDER — DEXMEDETOMIDINE HCL IN NACL 80 MCG/20ML IV SOLN
INTRAVENOUS | Status: DC | PRN
  Administered 2024-01-31 (×4): 4 ug via INTRAVENOUS

## 2024-01-31 MED ORDER — CEFAZOLIN SODIUM-DEXTROSE 2-4 GM/100ML-% IV SOLN
INTRAVENOUS | Status: AC
  Filled 2024-01-31: qty 100

## 2024-01-31 MED ORDER — ACETAMINOPHEN 10 MG/ML IV SOLN
INTRAVENOUS | Status: DC | PRN
  Administered 2024-01-31: 1000 mg via INTRAVENOUS

## 2024-01-31 MED ORDER — KETAMINE HCL 50 MG/5ML IJ SOSY
PREFILLED_SYRINGE | INTRAMUSCULAR | Status: DC | PRN
  Administered 2024-01-31 (×5): 10 mg via INTRAVENOUS

## 2024-01-31 MED ORDER — CELECOXIB 200 MG PO CAPS
200.0000 mg | ORAL_CAPSULE | Freq: Two times a day (BID) | ORAL | Status: DC
  Administered 2024-01-31 (×2): 200 mg via ORAL
  Filled 2024-01-31 (×3): qty 1

## 2024-01-31 MED ORDER — DEXAMETHASONE SODIUM PHOSPHATE 10 MG/ML IJ SOLN
INTRAMUSCULAR | Status: AC
  Filled 2024-01-31: qty 1

## 2024-01-31 MED ORDER — PHENOL 1.4 % MT LIQD: 1.0000 | OROMUCOSAL | Status: DC | PRN

## 2024-01-31 MED ORDER — ORAL CARE MOUTH RINSE: 15.0000 mL | Freq: Once | OROMUCOSAL | Status: AC

## 2024-01-31 MED ORDER — CHLORHEXIDINE GLUCONATE 4 % EX SOLN: 60.0000 mL | Freq: Once | CUTANEOUS | Status: DC

## 2024-01-31 MED ORDER — OXYCODONE HCL 5 MG/5ML PO SOLN: 5.0000 mg | Freq: Once | ORAL | Status: DC | PRN

## 2024-01-31 MED ORDER — FLEET ENEMA RE ENEM: 1.0000 | ENEMA | Freq: Once | RECTAL | Status: DC | PRN

## 2024-01-31 MED ORDER — OXYCODONE HCL 5 MG PO TABS
5.0000 mg | ORAL_TABLET | ORAL | Status: DC | PRN
  Administered 2024-01-31: 5 mg via ORAL
  Filled 2024-01-31: qty 1

## 2024-01-31 MED ORDER — METOPROLOL TARTRATE 50 MG PO TABS
50.0000 mg | ORAL_TABLET | Freq: Two times a day (BID) | ORAL | Status: DC
  Administered 2024-01-31 – 2024-02-01 (×2): 50 mg via ORAL
  Filled 2024-01-31 (×2): qty 1

## 2024-01-31 MED ORDER — ACETAMINOPHEN 325 MG PO TABS: 325.0000 mg | ORAL_TABLET | Freq: Four times a day (QID) | ORAL | Status: DC | PRN

## 2024-01-31 MED ORDER — ACETAMINOPHEN 10 MG/ML IV SOLN
INTRAVENOUS | Status: AC
  Filled 2024-01-31: qty 100

## 2024-01-31 MED ORDER — FENTANYL CITRATE (PF) 100 MCG/2ML IJ SOLN: 25.0000 ug | INTRAMUSCULAR | Status: DC | PRN

## 2024-01-31 MED ORDER — BUPIVACAINE HCL (PF) 0.5 % IJ SOLN
INTRAMUSCULAR | Status: AC
  Filled 2024-01-31: qty 10

## 2024-01-31 MED ORDER — TRANEXAMIC ACID-NACL 1000-0.7 MG/100ML-% IV SOLN
1000.0000 mg | Freq: Once | INTRAVENOUS | Status: AC
  Administered 2024-01-31: 1000 mg via INTRAVENOUS

## 2024-01-31 MED ORDER — FENTANYL CITRATE (PF) 100 MCG/2ML IJ SOLN
INTRAMUSCULAR | Status: DC | PRN
  Administered 2024-01-31: 25 ug via INTRAVENOUS
  Administered 2024-01-31: 50 ug via INTRAVENOUS
  Administered 2024-01-31: 25 ug via INTRAVENOUS

## 2024-01-31 MED ORDER — CEFAZOLIN SODIUM-DEXTROSE 2-4 GM/100ML-% IV SOLN
2.0000 g | INTRAVENOUS | Status: AC
Start: 2024-01-31 — End: 2024-01-31
  Administered 2024-01-31: 2 g via INTRAVENOUS

## 2024-01-31 MED ORDER — ACETAMINOPHEN 10 MG/ML IV SOLN
1000.0000 mg | Freq: Four times a day (QID) | INTRAVENOUS | Status: AC
Start: 2024-01-31 — End: 2024-02-01
  Administered 2024-01-31 – 2024-02-01 (×3): 1000 mg via INTRAVENOUS
  Filled 2024-01-31 (×3): qty 100

## 2024-01-31 MED ORDER — SODIUM CHLORIDE (PF) 0.9 % IJ SOLN
INTRAMUSCULAR | Status: DC | PRN
  Administered 2024-01-31: 120 mL via INTRAMUSCULAR

## 2024-01-31 MED ORDER — GABAPENTIN 300 MG PO CAPS
ORAL_CAPSULE | ORAL | Status: AC
  Filled 2024-01-31: qty 1

## 2024-01-31 MED ORDER — BISACODYL 10 MG RE SUPP: 10.0000 mg | Freq: Every day | RECTAL | Status: DC | PRN

## 2024-01-31 MED ORDER — SODIUM CHLORIDE 0.9 % IV SOLN: INTRAVENOUS | Status: DC

## 2024-01-31 MED ORDER — ONDANSETRON HCL 4 MG/2ML IJ SOLN
INTRAMUSCULAR | Status: DC | PRN
  Administered 2024-01-31: 4 mg via INTRAVENOUS

## 2024-01-31 MED ORDER — CHLORHEXIDINE GLUCONATE 0.12 % MT SOLN
15.0000 mL | Freq: Once | OROMUCOSAL | Status: AC
  Administered 2024-01-31: 15 mL via OROMUCOSAL

## 2024-01-31 MED ORDER — DIPHENHYDRAMINE HCL 12.5 MG/5ML PO ELIX: 12.5000 mg | ORAL_SOLUTION | ORAL | Status: DC | PRN

## 2024-01-31 MED ORDER — MAGNESIUM HYDROXIDE 400 MG/5ML PO SUSP
30.0000 mL | Freq: Every day | ORAL | Status: DC
  Administered 2024-01-31 – 2024-02-01 (×2): 30 mL via ORAL
  Filled 2024-01-31 (×2): qty 30

## 2024-01-31 MED ORDER — TEMAZEPAM 7.5 MG PO CAPS
30.0000 mg | ORAL_CAPSULE | Freq: Every day | ORAL | Status: DC
  Administered 2024-01-31: 30 mg via ORAL
  Filled 2024-01-31: qty 4

## 2024-01-31 MED ORDER — TRANEXAMIC ACID-NACL 1000-0.7 MG/100ML-% IV SOLN: 1000.0000 mg | INTRAVENOUS | Status: DC

## 2024-01-31 MED ORDER — DEXAMETHASONE SODIUM PHOSPHATE 10 MG/ML IJ SOLN
8.0000 mg | Freq: Once | INTRAMUSCULAR | Status: AC
  Administered 2024-01-31: 8 mg via INTRAVENOUS

## 2024-01-31 MED ORDER — CEFAZOLIN SODIUM-DEXTROSE 2-4 GM/100ML-% IV SOLN
2.0000 g | Freq: Four times a day (QID) | INTRAVENOUS | Status: AC
  Administered 2024-01-31 – 2024-02-01 (×2): 2 g via INTRAVENOUS
  Filled 2024-01-31 (×2): qty 100

## 2024-01-31 MED ORDER — OXYCODONE HCL 5 MG PO TABS: 10.0000 mg | ORAL_TABLET | ORAL | Status: DC | PRN

## 2024-01-31 MED ORDER — LIDOCAINE HCL (PF) 2 % IJ SOLN
INTRAMUSCULAR | Status: AC
  Filled 2024-01-31: qty 5

## 2024-01-31 MED ORDER — KETAMINE HCL 50 MG/5ML IJ SOSY
PREFILLED_SYRINGE | INTRAMUSCULAR | Status: AC
  Filled 2024-01-31: qty 5

## 2024-01-31 MED ORDER — CELECOXIB 200 MG PO CAPS
400.0000 mg | ORAL_CAPSULE | Freq: Once | ORAL | Status: AC
  Administered 2024-01-31: 400 mg via ORAL

## 2024-01-31 MED ORDER — PROPOFOL 1000 MG/100ML IV EMUL
INTRAVENOUS | Status: AC
  Filled 2024-01-31: qty 100

## 2024-01-31 MED ORDER — PANTOPRAZOLE SODIUM 40 MG PO TBEC
40.0000 mg | DELAYED_RELEASE_TABLET | Freq: Two times a day (BID) | ORAL | Status: DC
  Administered 2024-01-31 – 2024-02-01 (×3): 40 mg via ORAL
  Filled 2024-01-31 (×3): qty 1

## 2024-01-31 MED ORDER — ONDANSETRON HCL 4 MG PO TABS: 4.0000 mg | ORAL_TABLET | Freq: Four times a day (QID) | ORAL | Status: DC | PRN

## 2024-01-31 MED ORDER — LIDOCAINE HCL (CARDIAC) PF 100 MG/5ML IV SOSY
PREFILLED_SYRINGE | INTRAVENOUS | Status: DC | PRN
  Administered 2024-01-31: 70 mg via INTRAVENOUS

## 2024-01-31 MED ORDER — CHLORHEXIDINE GLUCONATE 0.12 % MT SOLN
OROMUCOSAL | Status: AC
  Filled 2024-01-31: qty 15

## 2024-01-31 MED ORDER — HYDROMORPHONE HCL 1 MG/ML IJ SOLN: 0.5000 mg | INTRAMUSCULAR | Status: DC | PRN

## 2024-01-31 MED ORDER — SODIUM CHLORIDE 0.9 % IR SOLN
Status: DC | PRN
  Administered 2024-01-31: 3000 mL

## 2024-01-31 MED ORDER — ASPIRIN 81 MG PO CHEW
81.0000 mg | CHEWABLE_TABLET | Freq: Two times a day (BID) | ORAL | Status: DC
  Administered 2024-01-31 – 2024-02-01 (×2): 81 mg via ORAL
  Filled 2024-01-31 (×2): qty 1

## 2024-01-31 MED ORDER — SENNOSIDES-DOCUSATE SODIUM 8.6-50 MG PO TABS
1.0000 | ORAL_TABLET | Freq: Two times a day (BID) | ORAL | Status: DC
  Administered 2024-01-31 – 2024-02-01 (×2): 1 via ORAL
  Filled 2024-01-31 (×2): qty 1

## 2024-01-31 MED ORDER — LACTATED RINGERS IV SOLN: INTRAVENOUS | Status: DC

## 2024-01-31 MED ORDER — ENSURE PRE-SURGERY PO LIQD
296.0000 mL | Freq: Once | ORAL | Status: AC
  Administered 2024-01-31: 296 mL via ORAL
  Filled 2024-01-31: qty 296

## 2024-01-31 MED ORDER — BUPIVACAINE HCL (PF) 0.25 % IJ SOLN
INTRAMUSCULAR | Status: AC
  Filled 2024-01-31: qty 60

## 2024-01-31 MED ORDER — PROPOFOL 500 MG/50ML IV EMUL
INTRAVENOUS | Status: DC | PRN
  Administered 2024-01-31: 25 ug/kg/min via INTRAVENOUS

## 2024-01-31 MED ORDER — ALUM & MAG HYDROXIDE-SIMETH 200-200-20 MG/5ML PO SUSP: 30.0000 mL | ORAL | Status: DC | PRN

## 2024-01-31 MED ORDER — SURGIRINSE WOUND IRRIGATION SYSTEM - OPTIME
TOPICAL | Status: DC | PRN
  Administered 2024-01-31: 900 mL

## 2024-01-31 MED ORDER — FENTANYL CITRATE (PF) 100 MCG/2ML IJ SOLN
INTRAMUSCULAR | Status: AC
  Filled 2024-01-31: qty 2

## 2024-01-31 MED ORDER — FERROUS SULFATE 325 (65 FE) MG PO TABS
325.0000 mg | ORAL_TABLET | Freq: Two times a day (BID) | ORAL | Status: DC
  Administered 2024-01-31 – 2024-02-01 (×2): 325 mg via ORAL
  Filled 2024-01-31 (×2): qty 1

## 2024-01-31 MED ORDER — BUPIVACAINE LIPOSOME 1.3 % IJ SUSP
INTRAMUSCULAR | Status: AC
  Filled 2024-01-31: qty 20

## 2024-01-31 MED ORDER — GABAPENTIN 300 MG PO CAPS
300.0000 mg | ORAL_CAPSULE | Freq: Once | ORAL | Status: AC
  Administered 2024-01-31: 300 mg via ORAL

## 2024-01-31 MED ORDER — ATORVASTATIN CALCIUM 20 MG PO TABS
40.0000 mg | ORAL_TABLET | Freq: Every day | ORAL | Status: DC
  Administered 2024-02-01: 40 mg via ORAL
  Filled 2024-01-31: qty 2

## 2024-01-31 MED ORDER — ONDANSETRON HCL 4 MG/2ML IJ SOLN: 4.0000 mg | Freq: Four times a day (QID) | INTRAMUSCULAR | Status: DC | PRN

## 2024-01-31 MED ORDER — ACETAMINOPHEN 10 MG/ML IV SOLN: 1000.0000 mg | Freq: Once | INTRAVENOUS | Status: DC | PRN

## 2024-01-31 MED ORDER — DROPERIDOL 2.5 MG/ML IJ SOLN: 0.6250 mg | Freq: Once | INTRAMUSCULAR | Status: DC | PRN

## 2024-01-31 MED ORDER — DEXMEDETOMIDINE HCL IN NACL 80 MCG/20ML IV SOLN
INTRAVENOUS | Status: AC
  Filled 2024-01-31: qty 20

## 2024-01-31 MED ORDER — MENTHOL 3 MG MT LOZG: 1.0000 | LOZENGE | OROMUCOSAL | Status: DC | PRN

## 2024-01-31 MED ORDER — POLYVINYL ALCOHOL 1.4 % OP SOLN
1.0000 [drp] | Freq: Two times a day (BID) | OPHTHALMIC | Status: DC
  Administered 2024-01-31 – 2024-02-01 (×2): 1 [drp] via OPHTHALMIC
  Filled 2024-01-31: qty 15

## 2024-01-31 MED ORDER — TRANEXAMIC ACID-NACL 1000-0.7 MG/100ML-% IV SOLN
INTRAVENOUS | Status: DC | PRN
  Administered 2024-01-31: 1000 mg via INTRAVENOUS

## 2024-01-31 MED ORDER — PHENYLEPHRINE 80 MCG/ML (10ML) SYRINGE FOR IV PUSH (FOR BLOOD PRESSURE SUPPORT)
PREFILLED_SYRINGE | INTRAVENOUS | Status: DC | PRN
  Administered 2024-01-31: 80 ug via INTRAVENOUS

## 2024-01-31 MED ORDER — METOCLOPRAMIDE HCL 5 MG PO TABS
10.0000 mg | ORAL_TABLET | Freq: Three times a day (TID) | ORAL | Status: DC
  Administered 2024-01-31 – 2024-02-01 (×3): 10 mg via ORAL
  Filled 2024-01-31 (×3): qty 2

## 2024-01-31 MED ORDER — OXYCODONE HCL 5 MG PO TABS: 5.0000 mg | ORAL_TABLET | Freq: Once | ORAL | Status: DC | PRN

## 2024-01-31 MED ORDER — LIDOCAINE HCL (PF) 1 % IJ SOLN
INTRAMUSCULAR | Status: DC | PRN
Start: 2024-01-31 — End: 2024-01-31
  Administered 2024-01-31: 2 mL via SUBCUTANEOUS

## 2024-01-31 MED ORDER — CELECOXIB 200 MG PO CAPS
ORAL_CAPSULE | ORAL | Status: AC
  Filled 2024-01-31: qty 2

## 2024-01-31 SURGICAL SUPPLY — 65 items
ATTUNE MED DOME PAT 38 KNEE (Knees) IMPLANT
ATTUNE PS FEM RT SZ 7 CEM KNEE (Femur) IMPLANT
ATTUNE PSRP INSR SZ7 5 KNEE (Insert) IMPLANT
BASE TIBIAL ROT PLAT SZ 8 KNEE (Knees) IMPLANT
BATTERY INSTRU NAVIGATION (MISCELLANEOUS) ×4 IMPLANT
BIT DRILL QUICK REL 1/8 2PK SL (BIT) ×1 IMPLANT
BLADE CLIPPER SURG (BLADE) IMPLANT
BLADE SAW 70X12.5 (BLADE) ×1 IMPLANT
BLADE SAW 90X13X1.19 OSCILLAT (BLADE) ×1 IMPLANT
BLADE SAW 90X25X1.19 OSCILLAT (BLADE) ×1 IMPLANT
BRUSH SCRUB EZ PLAIN DRY (MISCELLANEOUS) ×1 IMPLANT
CEMENT HV SMART SET (Cement) IMPLANT
COOLER POLAR GLACIER W/PUMP (MISCELLANEOUS) ×1 IMPLANT
CUFF TRNQT CYL 24X4X16.5-23 (TOURNIQUET CUFF) IMPLANT
CUFF TRNQT CYL 30X4X21-28X (TOURNIQUET CUFF) IMPLANT
DRAPE SHEET LG 3/4 BI-LAMINATE (DRAPES) ×1 IMPLANT
DRSG AQUACEL AG ADV 3.5X14 (GAUZE/BANDAGES/DRESSINGS) ×1 IMPLANT
DRSG MEPILEX SACRM 8.7X9.8 (GAUZE/BANDAGES/DRESSINGS) ×1 IMPLANT
DRSG TEGADERM 4X4.75 (GAUZE/BANDAGES/DRESSINGS) ×1 IMPLANT
DURAPREP 26ML APPLICATOR (WOUND CARE) ×2 IMPLANT
ELECT CAUTERY BLADE 6.4 (BLADE) ×1 IMPLANT
ELECT REM PT RETURN 9FT ADLT (ELECTROSURGICAL) ×1 IMPLANT
ELECTRODE REM PT RTRN 9FT ADLT (ELECTROSURGICAL) ×1 IMPLANT
EVACUATOR 1/8 PVC DRAIN (DRAIN) ×1 IMPLANT
EX-PIN ORTHOLOCK NAV 4X150 (PIN) ×2 IMPLANT
GAUZE XEROFORM 1X8 LF (GAUZE/BANDAGES/DRESSINGS) ×1 IMPLANT
GLOVE BIOGEL M STRL SZ7.5 (GLOVE) ×6 IMPLANT
GLOVE SURG UNDER POLY LF SZ8 (GLOVE) ×2 IMPLANT
GOWN STRL REUS W/ TWL LRG LVL3 (GOWN DISPOSABLE) ×1 IMPLANT
GOWN STRL REUS W/ TWL XL LVL3 (GOWN DISPOSABLE) ×1 IMPLANT
GOWN TOGA ZIPPER T7+ PEEL AWAY (MISCELLANEOUS) ×1 IMPLANT
HOLDER FOLEY CATH W/STRAP (MISCELLANEOUS) ×1 IMPLANT
HOOD PEEL AWAY T7 (MISCELLANEOUS) ×1 IMPLANT
IV NS IRRIG 3000ML ARTHROMATIC (IV SOLUTION) ×1 IMPLANT
KIT TURNOVER KIT A (KITS) ×1 IMPLANT
KNIFE SCULPS 14X20 (INSTRUMENTS) ×1 IMPLANT
MANIFOLD NEPTUNE II (INSTRUMENTS) ×2 IMPLANT
NDL SPNL 20GX3.5 QUINCKE YW (NEEDLE) ×2 IMPLANT
NEEDLE SPNL 20GX3.5 QUINCKE YW (NEEDLE) ×2 IMPLANT
PACK TOTAL KNEE (MISCELLANEOUS) ×1 IMPLANT
PAD ABD DERMACEA PRESS 5X9 (GAUZE/BANDAGES/DRESSINGS) ×2 IMPLANT
PAD ARMBOARD 7.5X6 YLW CONV (MISCELLANEOUS) ×3 IMPLANT
PAD WRAPON POLAR KNEE (MISCELLANEOUS) ×1 IMPLANT
PENCIL SMOKE EVACUATOR COATED (MISCELLANEOUS) ×1 IMPLANT
PIN DRILL FIX HALF THREAD (BIT) ×2 IMPLANT
PIN FIXATION 1/8DIA X 3INL (PIN) ×1 IMPLANT
PULSAVAC PLUS IRRIG FAN TIP (DISPOSABLE) ×1 IMPLANT
SOLUTION IRRIG SURGIPHOR (IV SOLUTION) ×1 IMPLANT
SPONGE DRAIN TRACH 4X4 STRL 2S (GAUZE/BANDAGES/DRESSINGS) ×1 IMPLANT
STAPLER SKIN PROX 35W (STAPLE) ×1 IMPLANT
STOCKINETTE STRL BIAS CUT 8X4 (MISCELLANEOUS) ×1 IMPLANT
STRAP TIBIA SHORT (MISCELLANEOUS) ×1 IMPLANT
SUCTION TUBE FRAZIER 10FR DISP (SUCTIONS) ×1 IMPLANT
SUT VIC AB 0 CT1 36 (SUTURE) ×1 IMPLANT
SUT VIC AB 1 CT1 36 (SUTURE) ×2 IMPLANT
SUT VIC AB 2-0 CT2 27 (SUTURE) ×1 IMPLANT
SYR 30ML LL (SYRINGE) ×2 IMPLANT
TIBIAL BASE ROT PLAT SZ 8 KNEE (Knees) ×1 IMPLANT
TIP FAN IRRIG PULSAVAC PLUS (DISPOSABLE) ×1 IMPLANT
TOWEL OR 17X26 4PK STRL BLUE (TOWEL DISPOSABLE) ×1 IMPLANT
TOWER CARTRIDGE SMART MIX (DISPOSABLE) ×1 IMPLANT
TRAP FLUID SMOKE EVACUATOR (MISCELLANEOUS) ×1 IMPLANT
TRAY FOLEY MTR SLVR 16FR STAT (SET/KITS/TRAYS/PACK) ×1 IMPLANT
WATER STERILE IRR 1000ML POUR (IV SOLUTION) ×1 IMPLANT
WRAPON POLAR PAD KNEE (MISCELLANEOUS) ×1 IMPLANT

## 2024-01-31 NOTE — Progress Notes (Signed)
 Pt admitted for rt. Knee surgery. Pt A&OX4. BP 115/70 (BP Location: Right Arm)   Pulse (!) 57   Temp 98.2 F (36.8 C)   Resp 16   Ht 5\' 10"  (1.778 m)   Wt 81 kg   SpO2 95%   BMI 25.62 kg/m  No skin issues. Pt not on tele. No pain stated. Pt nuero check WNL. Pt given menu, oriented to unit. Call bell nearby, bed alarm on, 2/4 rails up wheels locked no other needs voiced at this time.

## 2024-01-31 NOTE — Interval H&P Note (Signed)
 History and Physical Interval Note:  01/31/2024 6:40 AM  Andre Murphy  has presented today for surgery, with the diagnosis of PRIMARY OSTEOARTHRITIS OF RIGHT KNEE..  The various methods of treatment have been discussed with the patient and family. After consideration of risks, benefits and other options for treatment, the patient has consented to  Procedure(s): COMPUTER ASSISTED TOTAL KNEE ARTHROPLASTY - RNFA (Right) as a surgical intervention.  The patient's history has been reviewed, patient examined, no change in status, stable for surgery.  I have reviewed the patient's chart and labs.  Questions were answered to the patient's satisfaction.     Chinedu Agustin P Amariana Mirando

## 2024-01-31 NOTE — Transfer of Care (Signed)
 Immediate Anesthesia Transfer of Care Note  Patient: Andre Murphy  Procedure(s) Performed: COMPUTER ASSISTED TOTAL KNEE ARTHROPLASTY - RNFA (Right: Knee)  Patient Location: PACU  Anesthesia Type:Spinal  Level of Consciousness: sedated  Airway & Oxygen Therapy: Patient Spontanous Breathing and Patient connected to face mask oxygen  Post-op Assessment: Report given to RN and Post -op Vital signs reviewed and stable  Post vital signs: Reviewed and stable  Last Vitals:  Vitals Value Taken Time  BP 107/60 01/31/24 1131  Temp    Pulse 76 01/31/24 1131  Resp 19 01/31/24 1131  SpO2 97 % 01/31/24 1131  Vitals shown include unfiled device data.  Last Pain:  Vitals:   01/31/24 0711  TempSrc: Temporal  PainSc: 0-No pain         Complications: No notable events documented.

## 2024-01-31 NOTE — Anesthesia Preprocedure Evaluation (Signed)
 Anesthesia Evaluation  Patient identified by MRN, date of birth, ID band Patient awake    Reviewed: Allergy & Precautions, H&P , NPO status , Patient's Chart, lab work & pertinent test results, reviewed documented beta blocker date and time   Airway Mallampati: II   Neck ROM: full    Dental  (+) Poor Dentition   Pulmonary neg pulmonary ROS   Pulmonary exam normal        Cardiovascular Exercise Tolerance: Good hypertension, On Medications + CAD, + Past MI, + CABG and +CHF  Normal cardiovascular exam+ Valvular Problems/Murmurs  Rhythm:regular Rate:Normal     Neuro/Psych negative neurological ROS  negative psych ROS   GI/Hepatic negative GI ROS, Neg liver ROS,,,  Endo/Other  negative endocrine ROS    Renal/GU negative Renal ROS  negative genitourinary   Musculoskeletal   Abdominal   Peds  Hematology negative hematology ROS (+)   Anesthesia Other Findings Past Medical History: No date: Acute on chronic respiratory failure with hypoxia (HCC) No date: Aortic atherosclerosis (HCC) No date: Bilateral carotid artery disease (HCC)     Comment:  a.) s/p RIGHT carotid endarterectomy 2011 No date: Bursitis No date: Colon polyps No date: Coronary artery disease     Comment:  a.) LHC 03/07/2020: 100% o-pRCA, 100% dRCA, 99% p-mLCx,               85% pLCx, 85% mLCx, 90% OM1, 50% pLAD, 90% D1, 85/85%               mLAD --> CVTS; c.) s/p 3v CABG 03/18/2020 (LIMA-LAD,               SVG-RPDA, SVG-OM1) No date: Heart murmur 03/05/2020: HFrEF (heart failure with reduced ejection fraction) (HCC)     Comment:  a.) new onset 03/05/2020 in setting of NSTEMI No date: HLD (hyperlipidemia) No date: Hypertension No date: Insomnia     Comment:  a.) on BZO PRN (temazepam) 03/05/2020: Ischemic cardiomyopathy     Comment:  a.) TTE 03/06/2020: EF 35-45%; b.) LHC 03/07/2020: EF               35%; c.) TEE 03/18/2020: EF 43%; d.) TTE  06/09/2023: EF               45%; e.) TTE 05/08/2023: EF 50% No date: Leukocytosis No date: Long-term use of aspirin therapy No date: Male hypogonadism     Comment:  a.) on exogenous TST injections (depotestosterone               cypionate) 03/05/2020: NSTEMI (non-ST elevated myocardial infarction) (HCC)     Comment:  a.) troponins were trended: 1053 --> 1206 --> 1630 -->               1510 --> 1501 ng/L; b.) LHC 03/07/2020: 100% o-pRCA, 100%              dRCA, 99% p-mLCx, 85% pLCx, 85% mLCx, 90% OM1, 50% pLAD,               90% D1, 85/85% mLAD --> CVTS; c.) s/p 3v CABG 03/18/2020               (LIMA-LAD, SVG-RPDA, SVG-OM1) 2025: Primary osteoarthritis of right knee 03/18/2020: S/P AVR (aortic valve replacement)     Comment:  a.) 21 mm Inspiris bioprosthetic valve 03/18/2020: S/P CABG x 3     Comment:  a.) LIMA-LAD, SVG-RPDA, SVG-OM1 No date: Severe aortic stenosis  Comment:  a.) s/p AVR (21 mm Inspiris bioprosthetic valve)               03/18/2020; performed via sternotomy approach               concurrently with cardiac revascularization (CABG) No date: Skin cancer, basal cell No date: SOB (shortness of breath) 03/07/2020: Thoracic ascending aortic aneurysm (HCC)     Comment:  a.) LHC 03/07/2020: Ao root 5.6 cm; b.) CTA chest               03/08/2020: 4.6 cm No date: Thyroid nodule     Comment:  a.) s/p partial thyroidectomy No date: Vasovagal syncope Past Surgical History: 03/18/2020: AORTIC VALVE REPLACEMENT; N/A     Comment:  Procedure: AORTIC VALVE REPLACEMENT; Location: [facility];               Surgeon: Flint Melter, MD No date: BASAL CELL CARCINOMA EXCISION; Left     Comment:  Cheek 2011: CAROTID ENDARTERECTOMY; Right 08/20/2016: COLONOSCOPY WITH PROPOFOL; N/A     Comment:  Procedure: COLONOSCOPY WITH PROPOFOL;  Surgeon: Scot Jun, MD;  Location: [facility] ENDOSCOPY;  Service:               Endoscopy;  Laterality: N/A; 06/17/2023: COLONOSCOPY WITH  PROPOFOL; N/A     Comment:  Procedure: COLONOSCOPY WITH PROPOFOL;  Surgeon: Toney Reil, MD;  Location: [facility] ENDOSCOPY;  Service:               Gastroenterology;  Laterality: N/A; 03/18/2020: CORONARY ARTERY BYPASS GRAFT; N/A     Comment:  Procedure: CORONARY ARTERY BYPASS GRAFT; Location: [facility];              Surgeon: Flint Melter, MD 1970: INGUINAL HERNIA REPAIR; Bilateral 03/07/2020: LEFT HEART CATH AND CORONARY ANGIOGRAPHY; N/A     Comment:  Procedure: LEFT HEART CATH AND CORONARY ANGIOGRAPHY;                Surgeon: Lamar Blinks, MD;  Location: [facility] INVASIVE               CV LAB;  Service: Cardiovascular;  Laterality: N/A; 06/17/2023: POLYPECTOMY     Comment:  Procedure: POLYPECTOMY;  Surgeon: Toney Reil,               MD;  Location: [facility] ENDOSCOPY;  Service:               Gastroenterology;; No date: SHOULDER INJECTION No date: THYROIDECTOMY, PARTIAL   Reproductive/Obstetrics negative OB ROS                             Anesthesia Physical Anesthesia Plan  ASA: 3  Anesthesia Plan: Spinal   Post-op Pain Management:    Induction:   PONV Risk Score and Plan: 2  Airway Management Planned:   Additional Equipment:   Intra-op Plan:   Post-operative Plan:   Informed Consent: I have reviewed the patients History and Physical, chart, labs and discussed the procedure including the risks, benefits and alternatives for the proposed anesthesia with the patient or authorized representative who has indicated his/her understanding and acceptance.     Dental Advisory Given  Plan Discussed with: CRNA  Anesthesia Plan Comments:  Anesthesia Quick Evaluation

## 2024-01-31 NOTE — Op Note (Signed)
 OPERATIVE NOTE  DATE OF SURGERY:  01/31/2024  PATIENT NAME:  KEIONTE SWICEGOOD   DOB: July 31, 1941  MRN: [id number]  PRE-OPERATIVE DIAGNOSIS: Degenerative arthrosis of the right knee, primary  POST-OPERATIVE DIAGNOSIS:  Same  PROCEDURE:  Right total knee arthroplasty using computer-assisted navigation  SURGEON:  Jena Gauss. M.D.  ANESTHESIA: spinal  ESTIMATED BLOOD LOSS: 50 mL  FLUIDS REPLACED: 800 mL of crystalloid  TOURNIQUET TIME: 84 minutes  DRAINS: 2 medium Hemovac drains  SOFT TISSUE RELEASES: Anterior cruciate ligament, posterior cruciate ligament, deep medial collateral ligament, patellofemoral ligament  IMPLANTS UTILIZED: DePuy Attune size 7 posterior stabilized femoral component (cemented), size 8 rotating platform tibial component (cemented), 38 mm medialized dome patella (cemented), and a 5 mm stabilized rotating platform polyethylene insert.  INDICATIONS FOR SURGERY: ARISTIDE WAGGLE is a 83 y.o. year old male with a long history of progressive knee pain. X-rays demonstrated severe degenerative changes in tricompartmental fashion. The patient had not seen any significant improvement despite conservative nonsurgical intervention. After discussion of the risks and benefits of surgical intervention, the patient expressed understanding of the risks benefits and agree with plans for total knee arthroplasty.   The risks, benefits, and alternatives were discussed at length including but not limited to the risks of infection, bleeding, nerve injury, stiffness, blood clots, the need for revision surgery, cardiopulmonary complications, among others, and they were willing to proceed.  PROCEDURE IN DETAIL: The patient was brought into the operating room and, after adequate spinal anesthesia was achieved, a tourniquet was placed on the patient's upper thigh. The patient's knee and leg were cleaned and prepped with alcohol and DuraPrep and draped in the usual sterile fashion. A  "timeout" was performed as per usual protocol. The lower extremity was exsanguinated using an Esmarch, and the tourniquet was inflated to 300 mmHg. An anterior longitudinal incision was made followed by a standard mid vastus approach. The deep fibers of the medial collateral ligament were elevated in a subperiosteal fashion off of the medial flare of the tibia so as to maintain a continuous soft tissue sleeve. The patella was subluxed laterally and the patellofemoral ligament was incised. Inspection of the knee demonstrated severe degenerative changes with full-thickness loss of articular cartilage. Osteophytes were debrided using a rongeur. Anterior and posterior cruciate ligaments were excised. Two 4.0 mm Schanz pins were inserted in the femur and into the tibia for attachment of the array of trackers used for computer-assisted navigation. Hip center was identified using a circumduction technique. Distal landmarks were mapped using the computer. The distal femur and proximal tibia were mapped using the computer. The distal femoral cutting guide was positioned using computer-assisted navigation so as to achieve a 5 distal valgus cut. The femur was sized and it was felt that a size 7 femoral component was appropriate. A size 7 femoral cutting guide was positioned and the anterior cut was performed and verified using the computer. This was followed by completion of the posterior and chamfer cuts. Femoral cutting guide for the central box was then positioned in the center box cut was performed.  Attention was then directed to the proximal tibia. Medial and lateral menisci were excised. The extramedullary tibial cutting guide was positioned using computer-assisted navigation so as to achieve a 0 varus-valgus alignment and 3 posterior slope. The cut was performed and verified using the computer. The proximal tibia was sized and it was felt that a size 8 tibial tray was appropriate. Tibial and femoral trials were  inserted followed  by insertion of a 5 mm polyethylene insert. This allowed for excellent mediolateral soft tissue balancing both in flexion and in full extension. Finally, the patella was cut and prepared so as to accommodate a 38 mm medialized dome patella. A patella trial was placed and the knee was placed through a range of motion with excellent patellar tracking appreciated. The femoral trial was removed after debridement of posterior osteophytes. The central post-hole for the tibial component was reamed followed by insertion of a keel punch. Tibial trials were then removed. Cut surfaces of bone were irrigated with copious amounts of normal saline using pulsatile lavage and then suctioned dry. Polymethylmethacrylate cement was prepared in the usual fashion using a vacuum mixer. Cement was applied to the cut surface of the proximal tibia as well as along the undersurface of a size 8 rotating platform tibial component. Tibial component was positioned and impacted into place. Excess cement was removed using Personal assistant. Cement was then applied to the cut surfaces of the femur as well as along the posterior flanges of the size 7 femoral component. The femoral component was positioned and impacted into place. Excess cement was removed using Personal assistant. A 5 mm polyethylene trial was inserted and the knee was brought into full extension with steady axial compression applied. Finally, cement was applied to the backside of a 38 mm medialized dome patella and the patellar component was positioned and patellar clamp applied. Excess cement was removed using Personal assistant. After adequate curing of the cement, the tourniquet was deflated after a total tourniquet time of 84 minutes. Hemostasis was achieved using electrocautery. The knee was irrigated with copious amounts of normal saline using pulsatile lavage followed by 450 ml of Surgiphor and then suctioned dry. 20 mL of 1.3% Exparel and 60 mL of 0.25% Marcaine  in 40 mL of normal saline was injected along the posterior capsule, medial and lateral gutters, and along the arthrotomy site. A 5 mm stabilized rotating platform polyethylene insert was inserted and the knee was placed through a range of motion with excellent mediolateral soft tissue balancing appreciated and excellent patellar tracking noted. 2 medium drains were placed in the wound bed and brought out through separate stab incisions. The medial parapatellar portion of the incision was reapproximated using interrupted sutures of #1 Vicryl. Subcutaneous tissue was approximated in layers using first #0 Vicryl followed #2-0 Vicryl. The skin was approximated with skin staples. A sterile dressing was applied.  The patient tolerated the procedure well and was transported to the recovery room in stable condition.    Demarquis Osley P. Angie Fava., M.D.

## 2024-01-31 NOTE — Progress Notes (Signed)
 Patient is not able to walk the distance required to go the bathroom, or he/she is unable to safely negotiate stairs required to access the bathroom.  A 3in1 BSC will alleviate this problem   Amenda Duclos P. Angie Fava M.D.

## 2024-01-31 NOTE — Anesthesia Procedure Notes (Signed)
 Spinal  Patient location during procedure: OR Start time: 01/31/2024 7:54 AM End time: 01/31/2024 7:57 AM Reason for block: surgical anesthesia Staffing Performed: resident/CRNA  Resident/CRNA: Otho Perl, CRNA Performed by: Otho Perl, CRNA Authorized by: Yevette Edwards, MD   Preanesthetic Checklist Completed: patient identified, IV checked, site marked, risks and benefits discussed, surgical consent, monitors and equipment checked, pre-op evaluation and timeout performed Spinal Block Patient position: sitting Prep: ChloraPrep Patient monitoring: heart rate, cardiac monitor, continuous pulse ox and blood pressure Approach: left paramedian Location: L2-3 Injection technique: single-shot Needle Needle type: Quincke  Needle gauge: 22 G Needle length: 9 cm Needle insertion depth: 8 cm Assessment Sensory level: T10 Events: CSF return Additional Notes Attempts x 3, L2-3 midline, right paramedian then left paramedian before success. Pt tol well with sedation  WUJ#[id number] Exp 6/26

## 2024-01-31 NOTE — Evaluation (Signed)
 Physical Therapy Evaluation Patient Details Name: Andre Murphy MRN: [id number] DOB: 12/15/1940 Today's Date: 01/31/2024  History of Present Illness  Pt is an 83 y.o. male s/p R TKA secondary to degenerative arthrosis R knee 01/31/24.  PMH includes htn, CAD, h/o MI, CABG, CHF, acute on chronic respiratory failure with hypoxia, R carotid endarterectomy 2011, NSTEMI, insomnia.  Clinical Impression  Prior to surgery, pt reports being independent with functional mobility (limited d/t R knee impairments); lives alone on main level of home with 2 STE no railing; pt's son to stay with pt for a week and then pt's daughter to stay with pt for the following week to assist as needed.  0/10 pain throughout session.  R knee AROM to 90 degrees flexion.  Currently pt is SBA semi-supine to sitting EOB; min assist to stand from bed; and CGA to ambulate 30 feet with RW.  Pt would currently benefit from skilled PT to address noted impairments and functional limitations (see below for any additional details).  Upon hospital discharge, pt would benefit from ongoing therapy.     If plan is discharge home, recommend the following: A little help with walking and/or transfers;A little help with bathing/dressing/bathroom;Assist for transportation;Help with stairs or ramp for entrance   Can travel by private vehicle    Yes    Equipment Recommendations Rolling walker (2 wheels);BSC/3in1  Recommendations for Other Services  OT consult    Functional Status Assessment Patient has had a recent decline in their functional status and demonstrates the ability to make significant improvements in function in a reasonable and predictable amount of time.     Precautions / Restrictions Precautions Precautions: Knee;Fall Precaution Booklet Issued: Yes (comment) Recall of Precautions/Restrictions: Intact Precaution/Restrictions Comments: KI if unable to SLR Restrictions Weight Bearing Restrictions Per Provider Order: Yes RLE  Weight Bearing Per Provider Order: Weight bearing as tolerated      Mobility  Bed Mobility Overal bed mobility: Needs Assistance Bed Mobility: Supine to Sit     Supine to sit: Supervision, HOB elevated     General bed mobility comments: mild increased effort/time to perform on own    Transfers Overall transfer level: Needs assistance Equipment used: Rolling walker (2 wheels) Transfers: Sit to/from Stand Sit to Stand: Min assist           General transfer comment: vc's for UE/LE placement and overall technique    Ambulation/Gait Ambulation/Gait assistance: Contact guard assist Gait Distance (Feet): 30 Feet Assistive device: Rolling walker (2 wheels) Gait Pattern/deviations: Antalgic, Step-through pattern, Decreased step length - right, Decreased step length - left Gait velocity: decreased     General Gait Details: decreased stance time R LE; steady with RW use  Stairs            Wheelchair Mobility     Tilt Bed    Modified Rankin (Stroke Patients Only)       Balance Overall balance assessment: Needs assistance Sitting-balance support: No upper extremity supported, Feet supported Sitting balance-Leahy Scale: Good Sitting balance - Comments: steady reaching within BOS   Standing balance support: Bilateral upper extremity supported, During functional activity Standing balance-Leahy Scale: Good Standing balance comment: steady ambulating with RW use                             Pertinent Vitals/Pain Pain Assessment Pain Assessment: 0-10 Pain Score: 0-No pain Pain Location: R knee Pain Intervention(s): Limited activity within patient's tolerance, Monitored during  session, Repositioned, Other (comment) (polar care applied/activated) Vitals (HR and SpO2 on room air) stable throughout treatment session.    Home Living Family/patient expects to be discharged to:: Private residence Living Arrangements: Alone Available Help at Discharge:  Family Type of Home: House Home Access: Stairs to enter Entrance Stairs-Rails: None Entrance Stairs-Number of Steps: 2   Home Layout: Two level;Able to live on main level with bedroom/bathroom Home Equipment: Shower seat - built Charity fundraiser (2 wheels);Cane - single point Additional Comments: Son plans to stay with pt for next week; pt's daughter plans to stay with pt the week after that    Prior Function Prior Level of Function : Independent/Modified Independent             Mobility Comments: Independent with ambulation; no recent falls reported       Extremity/Trunk Assessment   Upper Extremity Assessment Upper Extremity Assessment: Overall WFL for tasks assessed    Lower Extremity Assessment Lower Extremity Assessment: RLE deficits/detail (L LE WFL) RLE Deficits / Details: good R isometric quad set strength; at least 3/5 AROM hip flexion and ankle DF/PF; able to perform R LE SLR independently RLE: Unable to fully assess due to pain    Cervical / Trunk Assessment Cervical / Trunk Assessment: Normal  Communication   Communication Communication: No apparent difficulties    Cognition Arousal: Alert Behavior During Therapy: WFL for tasks assessed/performed   PT - Cognitive impairments: No apparent impairments                         Following commands: Intact       Cueing Cueing Techniques: Verbal cues     General Comments General comments (skin integrity, edema, etc.): hemovac and dressings in place.  Nursing cleared pt for participation in physical therapy.  Pt agreeable to PT session.    Exercises Total Joint Exercises Ankle Circles/Pumps: AROM, Strengthening, Both, 10 reps, Supine Quad Sets: AROM, Strengthening, Both, 10 reps, Supine Heel Slides: AAROM, Strengthening, Right, 10 reps, Supine Hip ABduction/ADduction: AAROM, Strengthening, Right, 10 reps, Supine Straight Leg Raises: AAROM, AROM, Strengthening, Right, 10 reps,  Supine Goniometric ROM: R knee AROM 5 degrees short of neutral extension to 90 degrees flexion   Assessment/Plan    PT Assessment Patient needs continued PT services  PT Problem List Decreased strength;Decreased range of motion;Decreased balance;Decreased mobility;Decreased knowledge of use of DME;Decreased knowledge of precautions;Decreased skin integrity;Pain       PT Treatment Interventions DME instruction;Gait training;Stair training;Functional mobility training;Therapeutic activities;Therapeutic exercise;Balance training;Patient/family education    PT Goals (Current goals can be found in the Care Plan section)  Acute Rehab PT Goals Patient Stated Goal: to improve strength and walking PT Goal Formulation: With patient Time For Goal Achievement: 02/14/24 Potential to Achieve Goals: Good    Frequency BID     Co-evaluation               AM-PAC PT "6 Clicks" Mobility  Outcome Measure Help needed turning from your back to your side while in a flat bed without using bedrails?: None Help needed moving from lying on your back to sitting on the side of a flat bed without using bedrails?: A Little Help needed moving to and from a bed to a chair (including a wheelchair)?: A Little Help needed standing up from a chair using your arms (e.g., wheelchair or bedside chair)?: A Little Help needed to walk in hospital room?: A Little Help needed climbing 3-5 steps  with a railing? : A Little 6 Click Score: 19    End of Session Equipment Utilized During Treatment: Gait belt Activity Tolerance: Patient tolerated treatment well Patient left: in chair;with call bell/phone within reach;with chair alarm set;with nursing/sitter in room;with SCD's reapplied;Other (comment) (B heels floating via towel rolls; polar care in place/activated) Nurse Communication: Mobility status;Precautions;Weight bearing status;Other (comment) (0/10 R knee pain) PT Visit Diagnosis: Other abnormalities of gait and  mobility (R26.89);Muscle weakness (generalized) (M62.81);Pain Pain - Right/Left: Right Pain - part of body: Knee    Time: 2956-2130 PT Time Calculation (min) (ACUTE ONLY): 30 min   Charges:   PT Evaluation $PT Eval Low Complexity: 1 Low PT Treatments $Therapeutic Exercise: 8-22 mins $Therapeutic Activity: 8-22 mins PT General Charges $$ ACUTE PT VISIT: 1 Visit        Hendricks Limes, PT 01/31/24, 5:30 PM

## 2024-02-01 DIAGNOSIS — M1711 Unilateral primary osteoarthritis, right knee: Secondary | ICD-10-CM | POA: Diagnosis not present

## 2024-02-01 LAB — GLUCOSE, CAPILLARY: Glucose-Capillary: 148 mg/dL — ABNORMAL HIGH (ref 70–99)

## 2024-02-01 MED ORDER — TRAMADOL HCL 50 MG PO TABS: 50.0000 mg | ORAL_TABLET | ORAL | 0 refills | Status: AC | PRN

## 2024-02-01 MED ORDER — ONDANSETRON HCL 4 MG PO TABS
4.0000 mg | ORAL_TABLET | Freq: Four times a day (QID) | ORAL | 0 refills | Status: AC | PRN
Start: 2024-02-01 — End: ?

## 2024-02-01 MED ORDER — ACETAMINOPHEN 325 MG PO TABS
325.0000 mg | ORAL_TABLET | Freq: Four times a day (QID) | ORAL | Status: AC | PRN
Start: 2024-02-01 — End: ?

## 2024-02-01 MED ORDER — ASPIRIN 81 MG PO CHEW: 81.0000 mg | CHEWABLE_TABLET | Freq: Two times a day (BID) | ORAL | 0 refills | Status: AC

## 2024-02-01 MED ORDER — OXYCODONE HCL 5 MG PO TABS: 5.0000 mg | ORAL_TABLET | ORAL | 0 refills | Status: AC | PRN

## 2024-02-01 MED ORDER — CELECOXIB 200 MG PO CAPS: 200.0000 mg | ORAL_CAPSULE | Freq: Two times a day (BID) | ORAL | 0 refills | Status: AC

## 2024-02-01 MED ORDER — SENNOSIDES-DOCUSATE SODIUM 8.6-50 MG PO TABS: 1.0000 | ORAL_TABLET | Freq: Two times a day (BID) | ORAL | 0 refills | Status: AC

## 2024-02-01 NOTE — Care Management Obs Status (Signed)
 MEDICARE OBSERVATION STATUS NOTIFICATION   Patient Details  Name: Andre Murphy MRN: [id number] Date of Birth: 05-05-1941   Medicare Observation Status Notification Given:  Orland Dec, CMA 02/01/2024, 10:33 AM

## 2024-02-01 NOTE — Discharge Summary (Signed)
 Physician Discharge Summary  Patient ID: Andre Murphy MRN: [id number] DOB/AGE: 08-15-1941 83 y.o.  Admit date: 01/31/2024 Discharge date: 02/01/2024  Admission Diagnoses:  History of total knee arthroplasty, right [Z96.651]   Discharge Diagnoses: Patient Active Problem List   Diagnosis Date Noted   History of total knee arthroplasty, right 01/31/2024   Primary osteoarthritis of right knee 01/12/2024   History of colonic polyps 06/17/2023   Adenomatous polyp of colon 06/17/2023   Syncope 05/16/2023   Allergy 11/08/2020   HFrEF (heart failure with reduced ejection fraction) (HCC) 03/20/2020   S/P CABG x 3 03/20/2020   Acute postoperative pain 03/20/2020   Insomnia 03/20/2020   Hyperglycemia 03/19/2020   Aortic atherosclerosis (HCC) 03/14/2020   Moderate aortic valve stenosis 03/14/2020   Acute systolic CHF (congestive heart failure) (HCC)    Thoracic aortic aneurysm without rupture (HCC)    NSTEMI (non-ST elevated myocardial infarction) (HCC) 03/05/2020   Acute respiratory failure with hypoxia (HCC) 03/05/2020   Leukocytosis 03/05/2020   HLD (hyperlipidemia) 03/05/2020   Acute on chronic respiratory failure with hypoxia (HCC) 03/05/2020   Essential hypertension 12/28/2016   Carotid stenosis 12/28/2016    Past Medical History:  Diagnosis Date   Acute on chronic respiratory failure with hypoxia (HCC)    Aortic atherosclerosis (HCC)    Bilateral carotid artery disease (HCC)    a.) s/p RIGHT carotid endarterectomy 2011   Bursitis    Colon polyps    Coronary artery disease    a.) LHC 03/07/2020: 100% o-pRCA, 100% dRCA, 99% p-mLCx, 85% pLCx, 85% mLCx, 90% OM1, 50% pLAD, 90% D1, 85/85% mLAD --> CVTS; c.) s/p 3v CABG 03/18/2020 (LIMA-LAD, SVG-RPDA, SVG-OM1)   Heart murmur    HFrEF (heart failure with reduced ejection fraction) (HCC) 03/05/2020   a.) new onset 03/05/2020 in setting of NSTEMI   HLD (hyperlipidemia)    Hypertension    Insomnia    a.) on BZO PRN (temazepam)    Ischemic cardiomyopathy 03/05/2020   a.) TTE 03/06/2020: EF 35-45%; b.) LHC 03/07/2020: EF 35%; c.) TEE 03/18/2020: EF 43%; d.) TTE 06/09/2023: EF 45%; e.) TTE 05/08/2023: EF 50%   Leukocytosis    Long-term use of aspirin therapy    Male hypogonadism    a.) on exogenous TST injections (depotestosterone cypionate)   NSTEMI (non-ST elevated myocardial infarction) (HCC) 03/05/2020   a.) troponins were trended: 1053 --> 1206 --> 1630 --> 1510 --> 1501 ng/L; b.) LHC 03/07/2020: 100% o-pRCA, 100% dRCA, 99% p-mLCx, 85% pLCx, 85% mLCx, 90% OM1, 50% pLAD, 90% D1, 85/85% mLAD --> CVTS; c.) s/p 3v CABG 03/18/2020 (LIMA-LAD, SVG-RPDA, SVG-OM1)   Primary osteoarthritis of right knee 2025   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   a.) 21 mm Inspiris bioprosthetic valve   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 male who was admitted 01/31/2024 with a diagnosis of History  of total knee arthroplasty, right and went to the operating room on 01/31/2024 and underwent the above named procedures.    Surgeries: Procedure(s): COMPUTER ASSISTED TOTAL KNEE ARTHROPLASTY - RNFA on 01/31/2024 Patient tolerated the surgery well. Taken to PACU where she was stabilized and then transferred to the orthopedic floor.  Started on aspirin 81 mg twice daily.. TEDs and SCDs applied bilaterally. Heels elevated on bed. No evidence of DVT. Negative  Homan. Physical therapy started on day #1 for gait training and transfer. OT started day #1 for ADL and assisted devices.  Patient's IV was d/c on day #1.  Hemovac removed on postop day 1.  Patient was able to safely and independently complete all PT goals. PT recommending discharge to home.    On post op day #1 patient was stable and ready for discharge to home with home health PT.  Implants:  DePuy Attune size 7 posterior stabilized femoral component (cemented), size 8 rotating platform tibial component (cemented), 38 mm medialized dome patella (cemented), and a 5 mm stabilized rotating platform polyethylene insert.   He was given perioperative antibiotics:  Anti-infectives (From admission, onward)    Start     Dose/Rate Route Frequency Ordered Stop   01/31/24 1500  ceFAZolin (ANCEF) IVPB 2g/100 mL premix        2 g 200 mL/hr over 30 Minutes Intravenous Every 6 hours 01/31/24 1353 02/01/24 0106   01/31/24 0645  ceFAZolin (ANCEF) IVPB 2g/100 mL premix        2 g 200 mL/hr over 30 Minutes Intravenous On call to O.R. 01/31/24 1610 01/31/24 0803     .  He was given sequential compression devices, early ambulation, and aspirin, teds for DVT prophylaxis.  He benefited maximally from the hospital stay and there were no complications.    Recent vital signs:  Vitals:   01/31/24 1944 02/01/24 0726  BP: (!) 161/70 131/77  Pulse: 63 (!) 56  Resp: 18 15  Temp: 98.8 F (37.1 C) 97.6 F (36.4 C)  SpO2: 100% 97%    Recent laboratory studies:  Lab Results  Component Value Date   HGB 14.6 01/24/2024   HGB 14.1 04/20/2023   HGB 12.6 (L) 03/09/2020   Lab Results  Component Value Date   WBC 8.6 01/24/2024   PLT 167 01/24/2024   Lab Results  Component Value Date   INR 1.1 03/05/2020   Lab Results  Component Value Date   NA 142 01/24/2024   K 3.9 01/24/2024   CL 106 01/24/2024   CO2 30 01/24/2024   BUN 23 01/24/2024   CREATININE 1.12 01/24/2024   GLUCOSE 68 (L) 01/24/2024     Discharge Medications:   Allergies as of 02/01/2024       Reactions   Crestor [rosuvastatin Calcium] Other (See Comments)   Muscle pain        Medication List     STOP taking these medications    aspirin EC 81 MG tablet Replaced by: aspirin 81 MG chewable tablet   ibuprofen 400 MG tablet Commonly known as: ADVIL       TAKE these medications    acetaminophen 325 MG tablet Commonly known as: TYLENOL Take 1-2 tablets (325-650 mg total) by mouth every 6 (six) hours as needed for mild pain (pain score 1-3) (or temp > 100.5).   aspirin 81 MG chewable tablet Chew 1 tablet (81 mg total) by mouth 2 (two) times daily. Replaces: aspirin EC 81 MG tablet   atorvastatin 40  MG tablet Commonly known as: LIPITOR Take 40 mg by mouth daily.   carboxymethylcellulose 0.5 % Soln Commonly known as: REFRESH PLUS Place 1 drop into both eyes in the morning and at bedtime.   celecoxib 200 MG capsule Commonly known as: CELEBREX Take 1 capsule (200 mg total) by mouth 2 (two) times daily for 10 days. With food   cyanocobalamin 1000 MCG tablet Commonly known as: VITAMIN B12 Take 1,000 mcg by mouth daily.   metoprolol tartrate 50 MG tablet Commonly known as: LOPRESSOR Take 1 tablet (50 mg total) by mouth 2 (two) times daily.   ondansetron 4 MG tablet Commonly known as: ZOFRAN Take 1 tablet (4 mg total) by mouth every 6 (six) hours as needed for nausea.   oxyCODONE 5 MG immediate release tablet Commonly known as: Oxy IR/ROXICODONE Take 1 tablet (5 mg total) by mouth every 4 (four) hours as needed for moderate pain (pain score 4-6) (pain score 4-6).   senna-docusate 8.6-50 MG tablet Commonly known as: Senokot-S Take 1 tablet by mouth 2 (two) times daily.   temazepam 30 MG capsule Commonly known as: RESTORIL Take 30 mg by mouth at bedtime.   testosterone cypionate 200 MG/ML injection Commonly known as: DEPOTESTOSTERONE CYPIONATE Inject 200 mg into the muscle every 21 (  twenty-one) days.   traMADol 50 MG tablet Commonly known as: ULTRAM Take 1 tablet (50 mg total) by mouth every 4 (four) hours as needed for moderate pain (pain score 4-6).   triamcinolone ointment 0.1 % Commonly known as: KENALOG Apply 1 Application topically 2 (two) times daily as needed (irritation).   valsartan 160 MG tablet Commonly known as: DIOVAN Take 160 mg by mouth daily.               Durable Medical Equipment  (From admission, onward)           Start     Ordered   01/31/24 1118  DME Walker rolling  Once       Question:  Patient needs a walker to treat with the following condition  Answer:  Total knee replacement status   01/31/24 1117   01/31/24 1118  DME Bedside commode  Once       Comments: Patient is not able to walk the distance required to go the bathroom, or he/she is unable to safely negotiate stairs required to access the bathroom.  A 3in1 BSC will alleviate this problem  Question:  Patient needs a bedside commode to treat with the following condition  Answer:  Total knee replacement status   01/31/24 1117            Diagnostic Studies: DG Knee Right Port Result Date: 01/31/2024 CLINICAL DATA:  Status post 0 knee replacement EXAM: PORTABLE RIGHT KNEE - 2 VIEW COMPARISON:  None Available. FINDINGS: Acute surgical changes of total knee arthroplasty with cemented tibial component. Patellar button. Press-Fit femoral component. There is some linear lucencies along the distal femoral metaphysis from presumed prior hardware. Drain in place. Midline anterior skin staples. Mild soft tissue gas. Expected alignment. Imaging was obtained to aid in treatment. Prominent vascular calcifications. IMPRESSION: Acute surgical changes of total knee arthroplasty. Electronically Signed   By: Karen Kays M.D.   On: 01/31/2024 12:02    Disposition:      Follow-up Information     Evon Slack, PA-C Follow up on 02/14/2024.   Specialties: Orthopedic Surgery,  Emergency Medicine Why: at 10:45am Contact information: [address] [place]  [address] [phone]         Donato Heinz, MD Follow up on 03/12/2024.   Specialty: Orthopedic Surgery Why: at 3:00pm Contact information: [address] [facility] [place] [address] [phone]                  Signed: Evon Slack 02/01/2024, 8:01 AM

## 2024-02-01 NOTE — Anesthesia Postprocedure Evaluation (Signed)
 Anesthesia Post Note  Patient: Andre Murphy  Procedure(s) Performed: COMPUTER ASSISTED TOTAL KNEE ARTHROPLASTY - RNFA (Right: Knee)  Patient location during evaluation: Nursing Unit Anesthesia Type: Spinal Level of consciousness: oriented and awake and alert Pain management: pain level controlled Vital Signs Assessment: post-procedure vital signs reviewed and stable Respiratory status: spontaneous breathing and respiratory function stable Cardiovascular status: blood pressure returned to baseline and stable Postop Assessment: no headache, no backache and no apparent nausea or vomiting Anesthetic complications: no   No notable events documented.   Last Vitals:  Vitals:   01/31/24 1944 02/01/24 0726  BP: (!) 161/70 131/77  Pulse: 63 (!) 56  Resp: 18 15  Temp: 37.1 C 36.4 C  SpO2: 100% 97%    Last Pain:  Vitals:   02/01/24 0726  TempSrc: Oral  PainSc:                  Corinda Gubler

## 2024-02-01 NOTE — TOC Transition Note (Signed)
 Transition of Care [facility]) - Discharge Note   Patient Details  Name: Andre Murphy MRN: [id number] Date of Birth: 1941/11/03  [facility]) CM/SW Contact:  Bing Quarry, RN Phone Number: 02/01/2024, 9:40 AM   Clinical Narrative:  3/8: Admitted 01/31/24  for total knee arthroplasty history. Patient has discharge orders in for today. Has needed DME at home per patient. Home Health via [facility]. RN CM will notify home health agency of discharge.    Gabriel Cirri MSN RN CM  RN Case Manager [place]  Transitions of Care Direct Dial: [phone] (Weekends Only) [facility] Main Office Phone: [phone] [facility] Fax: [phone] [email or web address]      Final next level of care: Home w Home Health Services Barriers to Discharge: No Barriers Identified   Patient Goals and CMS Choice     Choice offered to / list presented to : Patient      Discharge Placement                       Discharge Plan and Services Additional resources added to the After Visit Summary for                  DME Arranged: N/A (Patient has needed DME at home.) DME Agency: NA       HH Arranged: PT HH Agency: [facility]        Social Drivers of Health (SDOH) Interventions SDOH Screenings   Food Insecurity: No Food Insecurity (01/31/2024)  Housing: Low Risk  (01/31/2024)  Transportation Needs: No Transportation Needs (01/31/2024)  Utilities: Not At Risk (01/31/2024)  Depression (PHQ2-9): Low Risk  (08/24/2020)  Social Connections: Unknown (01/31/2024)  Tobacco Use: Low Risk  (01/31/2024)     Readmission Risk Interventions     No data to display

## 2024-02-01 NOTE — Evaluation (Signed)
 Occupational Therapy Evaluation Patient Details Name: Andre Murphy MRN: [id number] DOB: 20-Sep-1941 Today's Date: 02/01/2024   History of Present Illness   Pt is an 83 y.o. male s/p R TKA secondary to degenerative arthrosis R knee 01/31/24.  PMH includes htn, CAD, h/o MI, CABG, CHF, acute on chronic respiratory failure with hypoxia, R carotid endarterectomy 2011, NSTEMI, insomnia.     Clinical Impressions Pt is able to perform LB and UB dressing, sit<>stand transfers, ambulation w/ RW, toileting, all without any physical assistance; provided SUPV for safety but pt did not demonstrate any unsafe behaviors, unsteadiness, or LOB. Provided educ on polar care mgmt, home safety, falls prevention. Pt verbalizes understanding. Pt has a 2-story home but can live on first floor; he has been cleared to get up and down 2 steps, which is what he needs to do to get into/out of his home. Pt's son will be living with him his first week home, with daughter staying with him the subsequent week. With this assistance, pt appears safe and capable of managing required fxl mobility and ADL tasks post DC; no further OT services are required at this time.     If plan is discharge home, recommend the following:   A little help with walking and/or transfers;A little help with bathing/dressing/bathroom     Functional Status Assessment         Equipment Recommendations   None recommended by OT     Recommendations for Other Services         Precautions/Restrictions   Precautions Precautions: Knee;Fall Recall of Precautions/Restrictions: Intact Restrictions Weight Bearing Restrictions Per Provider Order: Yes RLE Weight Bearing Per Provider Order: Weight bearing as tolerated     Mobility Bed Mobility               General bed mobility comments: NT -- pt received, left in recliner    Transfers Overall transfer level: Needs assistance Equipment used: Rolling walker (2 wheels) Transfers:  Sit to/from Stand Sit to Stand: Supervision           General transfer comment: initial VC for safe use, hand placement on RW; pt able to perform subsequent sit<>stand w/o cueing      Balance Overall balance assessment: Needs assistance Sitting-balance support: No upper extremity supported, Feet supported Sitting balance-Leahy Scale: Good     Standing balance support: Bilateral upper extremity supported, During functional activity Standing balance-Leahy Scale: Good Standing balance comment: steady ambulating with RW use                           ADL either performed or assessed with clinical judgement   ADL Overall ADL's : Needs assistance/impaired                 Upper Body Dressing : Modified independent;Sitting   Lower Body Dressing: Supervision/safety;Sit to/from stand   Toilet Transfer: Supervision/safety;Ambulation;Regular Toilet;Rolling walker (2 wheels)   Toileting- Clothing Manipulation and Hygiene: Modified independent               Vision         Perception         Praxis         Pertinent Vitals/Pain Pain Assessment Pain Assessment: 0-10 Pain Score: 3  Pain Location: R knee Pain Descriptors / Indicators: Aching Pain Intervention(s): Limited activity within patient's tolerance, Monitored during session, Repositioned, Ice applied     Extremity/Trunk Assessment Upper Extremity Assessment Upper Extremity Assessment:  Overall [facility] for tasks assessed   Lower Extremity Assessment RLE Deficits / Details: good R isometric quad set strength; at least 3/5 AROM hip flexion and ankle DF/PF; able to perform R LE SLR independently   Cervical / Trunk Assessment Cervical / Trunk Assessment: Normal   Communication Communication Communication: No apparent difficulties   Cognition Arousal: Alert Behavior During Therapy: WFL for tasks assessed/performed                                 Following commands: Intact        Cueing  General Comments          Exercises Other Exercises Other Exercises: LB and UB dressing, toileting, ambulation, educ re: polar care mgmt, bathing, home safety, falls prevention   Shoulder Instructions      Home Living Family/patient expects to be discharged to:: Private residence Living Arrangements: Alone Available Help at Discharge: Family Type of Home: House Home Access: Stairs to enter   Entrance Stairs-Rails: None Home Layout: Two level;Able to live on main level with bedroom/bathroom     Bathroom Shower/Tub: Arts development officer Toilet: Handicapped height     Home Equipment: Shower seat - built Charity fundraiser (2 wheels);Cane - single point   Additional Comments: Son plans to stay with pt for next week; pt's daughter plans to stay with pt the week after that      Prior Functioning/Environment Prior Level of Function : Independent/Modified Independent             Mobility Comments: Independent with ambulation; no recent falls reported ADLs Comments: IND    OT Problem List: Decreased range of motion;Decreased strength;Decreased activity tolerance;Impaired balance (sitting and/or standing)   OT Treatment/Interventions:        OT Goals(Current goals can be found in the care plan section)       OT Frequency:       Co-evaluation              AM-PAC OT "6 Clicks" Daily Activity     Outcome Measure Help from another person eating meals?: None Help from another person taking care of personal grooming?: None Help from another person toileting, which includes using toliet, bedpan, or urinal?: A Little Help from another person bathing (including washing, rinsing, drying)?: A Little Help from another person to put on and taking off regular upper body clothing?: None Help from another person to put on and taking off regular lower body clothing?: None 6 Click Score: 22   End of Session Equipment Utilized During Treatment: Rolling  walker (2 wheels)  Activity Tolerance: Patient tolerated treatment well Patient left: in chair;with nursing/sitter in room;with call bell/phone within reach  OT Visit Diagnosis: Muscle weakness (generalized) (M62.81)                Time: 4098-1191 OT Time Calculation (min): 20 min Charges:  OT General Charges $OT Visit: 1 Visit OT Evaluation $OT Eval Low Complexity: 1 Low OT Treatments $Self Care/Home Management : 8-22 mins Latina Craver, PhD, MS, OTR/L 02/01/24, 10:46 AM

## 2024-02-01 NOTE — Plan of Care (Signed)

## 2024-02-01 NOTE — Progress Notes (Signed)
 Subjective: 1 Day Post-Op Procedure(s) (LRB): COMPUTER ASSISTED TOTAL KNEE ARTHROPLASTY - RNFA (Right) Patient reports pain as mild.   Patient is well, and has had no acute complaints or problems Denies any CP, SOB, ABD pain. We will continue therapy today.  Plan is to go Home after hospital stay.  Objective: Vital signs in last 24 hours: Temp:  [97.6 F (36.4 C)-98.8 F (37.1 C)] 97.6 F (36.4 C) (03/08 0726) Pulse Rate:  [56-72] 56 (03/08 0726) Resp:  [12-19] 15 (03/08 0726) BP: (107-161)/(60-85) 131/77 (03/08 0726) SpO2:  [95 %-100 %] 97 % (03/08 0726)  Intake/Output from previous day: 03/07 0701 - 03/08 0700 In: 1825 [P.O.:325; I.V.:900; IV Piggyback:600] Out: 520 [Urine:400; Drains:70; Blood:50] Intake/Output this shift: No intake/output data recorded.  No results for input(s): "HGB" in the last 72 hours. No results for input(s): "WBC", "RBC", "HCT", "PLT" in the last 72 hours. No results for input(s): "NA", "K", "CL", "CO2", "BUN", "CREATININE", "GLUCOSE", "CALCIUM" in the last 72 hours. No results for input(s): "LABPT", "INR" in the last 72 hours.  EXAM General - Patient is Alert, Appropriate, and Oriented Extremity - Neurovascular intact Sensation intact distally Intact pulses distally Dorsiflexion/Plantar flexion intact Dressing - dressing C/D/I and no drainage Motor Function - intact, moving foot and toes well on exam.   Past Medical History:  Diagnosis Date   Acute on chronic respiratory failure with hypoxia (HCC)    Aortic atherosclerosis (HCC)    Bilateral carotid artery disease (HCC)    a.) s/p RIGHT carotid endarterectomy 2011   Bursitis    Colon polyps    Coronary artery disease    a.) LHC 03/07/2020: 100% o-pRCA, 100% dRCA, 99% p-mLCx, 85% pLCx, 85% mLCx, 90% OM1, 50% pLAD, 90% D1, 85/85% mLAD --> CVTS; c.) s/p 3v CABG 03/18/2020 (LIMA-LAD, SVG-RPDA, SVG-OM1)   Heart murmur    HFrEF (heart failure with reduced ejection fraction) (HCC)  03/05/2020   a.) new onset 03/05/2020 in setting of NSTEMI   HLD (hyperlipidemia)    Hypertension    Insomnia    a.) on BZO PRN (temazepam)   Ischemic cardiomyopathy 03/05/2020   a.) TTE 03/06/2020: EF 35-45%; b.) LHC 03/07/2020: EF 35%; c.) TEE 03/18/2020: EF 43%; d.) TTE 06/09/2023: EF 45%; e.) TTE 05/08/2023: EF 50%   Leukocytosis    Long-term use of aspirin therapy    Male hypogonadism    a.) on exogenous TST injections (depotestosterone cypionate)   NSTEMI (non-ST elevated myocardial infarction) (HCC) 03/05/2020   a.) troponins were trended: 1053 --> 1206 --> 1630 --> 1510 --> 1501 ng/L; b.) LHC 03/07/2020: 100% o-pRCA, 100% dRCA, 99% p-mLCx, 85% pLCx, 85% mLCx, 90% OM1, 50% pLAD, 90% D1, 85/85% mLAD --> CVTS; c.) s/p 3v CABG 03/18/2020 (LIMA-LAD, SVG-RPDA, SVG-OM1)   Primary osteoarthritis of right knee 2025   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   a.) 21 mm Inspiris bioprosthetic valve   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   a.) LIMA-LAD, SVG-RPDA, SVG-OM1   Severe aortic stenosis    a.) s/p AVR (21 mm Inspiris bioprosthetic valve) 03/18/2020; performed via sternotomy approach concurrently with cardiac revascularization (CABG)   Skin cancer, basal cell    SOB (shortness of breath)    Thoracic ascending aortic aneurysm (HCC) 03/07/2020   a.) LHC 03/07/2020: Ao root 5.6 cm; b.) CTA chest 03/08/2020: 4.6 cm   Thyroid nodule    a.) s/p partial thyroidectomy   Vasovagal syncope     Assessment/Plan:   1 Day Post-Op Procedure(s) (  LRB): COMPUTER ASSISTED TOTAL KNEE ARTHROPLASTY - RNFA (Right) Principal Problem:   History of total knee arthroplasty, right  Estimated body mass index is 25.62 kg/m as calculated from the following:   Height as of this encounter: 5\' 10"  (1.778 m).   Weight as of this encounter: 81 kg. Advance diet Up with therapy Patient doing well this morning.  No complaints. Pain well-controlled Vital signs are stable Care management to assist with discharge to  home with home health PT  DVT Prophylaxis - Aspirin, TED hose, and SCDs Weight-Bearing as tolerated to right leg   T. Cranston Neighbor, PA-C [facility] 02/01/2024, 7:57 AM

## 2024-02-01 NOTE — Progress Notes (Signed)
 Physical Therapy Treatment Patient Details Name: Andre Murphy MRN: [id number] DOB: 02-Sep-1941 Today's Date: 02/01/2024   History of Present Illness Pt is an 83 y.o. male s/p R TKA secondary to degenerative arthrosis R knee 01/31/24.  PMH includes htn, CAD, h/o MI, CABG, CHF, acute on chronic respiratory failure with hypoxia, R carotid endarterectomy 2011, NSTEMI, insomnia.    PT Comments  Pt received in bed with R LE extended in bone foam, no true c/o pain. Discussed d/c plans for today, pt feels comfortable with returning home with son staying with him for a week or two. Overall, pt is functioning at a Supervision level for bed mobility, transfers, and gait with RW. No significant c/o throughout session. Reviewed HEP and proper positioning/safe mobility. Pt appears safe for d/c home. Continue PT acutely if d/c is delayed. OT in to see pt at conclusion of PT session.    If plan is discharge home, recommend the following: A little help with walking and/or transfers;A little help with bathing/dressing/bathroom;Assist for transportation;Help with stairs or ramp for entrance   Can travel by private vehicle        Equipment Recommendations  None recommended by PT (Pt has 2 RW at home)    Recommendations for Other Services       Precautions / Restrictions Precautions Precautions: Knee;Fall Precaution Booklet Issued: Yes (comment) Recall of Precautions/Restrictions: Intact Precaution/Restrictions Comments: KI if unable to SLR Required Braces or Orthoses:  (+SLR) Restrictions Weight Bearing Restrictions Per Provider Order: Yes RLE Weight Bearing Per Provider Order: Weight bearing as tolerated     Mobility  Bed Mobility Overal bed mobility: Needs Assistance Bed Mobility: Supine to Sit     Supine to sit: Supervision, HOB elevated          Transfers Overall transfer level: Needs assistance Equipment used: Rolling walker (2 wheels) Transfers: Sit to/from Stand Sit to Stand:  Supervision                Ambulation/Gait Ambulation/Gait assistance: Supervision Gait Distance (Feet): 150 Feet Assistive device: Rolling walker (2 wheels) Gait Pattern/deviations: Antalgic, Step-through pattern, Decreased step length - right, Decreased step length - left, Wide base of support       General Gait Details: Cues to stay inside walker, no pivoting on R knee, increase heel stike bilaterally   Stairs Stairs:  (Pt has 2 steps to enter, discussed technique, pt states he is comfortable with steps and is not concerned with negotiation.)           Wheelchair Mobility     Tilt Bed    Modified Rankin (Stroke Patients Only)       Balance Overall balance assessment: Needs assistance Sitting-balance support: No upper extremity supported, Feet supported Sitting balance-Leahy Scale: Good Sitting balance - Comments: steady reaching within BOS   Standing balance support: Bilateral upper extremity supported, During functional activity Standing balance-Leahy Scale: Good Standing balance comment: steady ambulating with RW use                            Communication Communication Communication: No apparent difficulties  Cognition Arousal: Alert Behavior During Therapy: WFL for tasks assessed/performed   PT - Cognitive impairments: No apparent impairments                         Following commands: Intact      Cueing Cueing Techniques: Verbal cues  Exercises Total Joint  Exercises Ankle Circles/Pumps: AROM, Strengthening, Both, 10 reps, Supine Quad Sets: AROM, Strengthening, Both, 10 reps, Supine Heel Slides: AROM, Right, 5 reps, Supine Hip ABduction/ADduction: AROM, Right, 5 reps, Supine Straight Leg Raises: AROM, Right, 5 reps, Supine General Exercises - Lower Extremity Long Arc Quad: AROM, Right, 5 reps, Seated Other Exercises Other Exercises: Pt educated on proper positioning to promote full knee extension, exercise packet,  and safe mobility with good understanding    General Comments General comments (skin integrity, edema, etc.): R knee dressing intact without noteable drainage      Pertinent Vitals/Pain Pain Assessment Pain Assessment: 0-10 Pain Score: 2  Pain Location: R knee Pain Descriptors / Indicators: Aching Pain Intervention(s): Monitored during session    Home Living Family/patient expects to be discharged to:: Private residence Living Arrangements: Alone Available Help at Discharge: Family Type of Home: House Home Access: Stairs to enter Entrance Stairs-Rails: None     Home Layout: Two level;Able to live on main level with bedroom/bathroom Home Equipment: Shower seat - built [employer] fundraiser (2 wheels);Cane - single point Additional Comments: Son plans to stay with pt for next week; pt's daughter plans to stay with pt the week after that    Prior Function            PT Goals (current goals can now be found in the care plan section) Acute Rehab PT Goals Patient Stated Goal: to improve strength and walking Progress towards PT goals: Progressing toward goals    Frequency    BID      PT Plan      Co-evaluation              AM-PAC PT "6 Clicks" Mobility   Outcome Measure  Help needed turning from your back to your side while in a flat bed without using bedrails?: None Help needed moving from lying on your back to sitting on the side of a flat bed without using bedrails?: A Little Help needed moving to and from a bed to a chair (including a wheelchair)?: A Little Help needed standing up from a chair using your arms (e.g., wheelchair or bedside chair)?: A Little Help needed to walk in hospital room?: A Little Help needed climbing 3-5 steps with a railing? : A Little 6 Click Score: 19    End of Session Equipment Utilized During Treatment: Gait belt Activity Tolerance: Patient tolerated treatment well Patient left: in chair;with call bell/phone within reach;with  chair alarm set;with nursing/sitter in room Nurse Communication: Mobility status;Precautions;Weight bearing status;Other (comment) PT Visit Diagnosis: Other abnormalities of gait and mobility (R26.89);Muscle weakness (generalized) (M62.81);Pain Pain - Right/Left: Right Pain - part of body: Knee     Time: 0931-1010 PT Time Calculation (min) (ACUTE ONLY): 39 min  Charges:    $Gait Training: 8-22 mins $Therapeutic Exercise: 8-22 mins $Therapeutic Activity: 8-22 mins PT General Charges $$ ACUTE PT VISIT: 1 Visit                    Zadie Cleverly, PTA  Jannet Askew 02/01/2024, 1:19 PM

## 2024-02-01 NOTE — Progress Notes (Cosign Needed)
    Durable Medical Equipment  (From admission, onward)           Start     Ordered   01/31/24 1118  DME Walker rolling  Once       Question:  Patient needs a walker to treat with the following condition  Answer:  Total knee replacement status   01/31/24 1117   01/31/24 1118  DME Bedside commode  Once       Comments: Patient is not able to walk the distance required to go the bathroom, or he/she is unable to safely negotiate stairs required to access the bathroom.  A 3in1 BSC will alleviate this problem  Question:  Patient needs a bedside commode to treat with the following condition  Answer:  Total knee replacement status   01/31/24 1117

## 2024-02-03 ENCOUNTER — Encounter: Payer: Self-pay | Admitting: Orthopedic Surgery

## 2024-06-30 ENCOUNTER — Encounter (INDEPENDENT_AMBULATORY_CARE_PROVIDER_SITE_OTHER): Payer: Self-pay | Admitting: Vascular Surgery

## 2024-06-30 ENCOUNTER — Ambulatory Visit (INDEPENDENT_AMBULATORY_CARE_PROVIDER_SITE_OTHER): Payer: Medicare Other | Admitting: Vascular Surgery

## 2024-06-30 ENCOUNTER — Ambulatory Visit (INDEPENDENT_AMBULATORY_CARE_PROVIDER_SITE_OTHER): Payer: Medicare Other

## 2024-06-30 VITALS — BP 150/83 | HR 68 | Resp 16 | Ht 70.0 in | Wt 176.0 lb

## 2024-06-30 DIAGNOSIS — I6523 Occlusion and stenosis of bilateral carotid arteries: Secondary | ICD-10-CM

## 2024-06-30 NOTE — Progress Notes (Unsigned)
 MRN : [id number]  Andre Murphy is a 83 y.o. ([birth year]) male who presents with chief complaint of  Chief Complaint  Patient presents with   Follow-up    6 month follow up Carotid   .  History of Present Illness: Patient returns today in follow up of his carotid disease.  He is doing well.  We had a nice talk about all the sports related issues currently going on.  I enjoyed my visit with him today as I always do.  He had no focal neurologic symptoms of cerebrovascular ischemia. Specifically, the patient denies amaurosis fugax, speech or swallowing difficulties, or arm or leg weakness or numbness.  His carotid duplex today reveals stable 40 to 59% right ICA stenosis and a widely patent left carotid endarterectomy.  Current Outpatient Medications  Medication Sig Dispense Refill   acetaminophen  (TYLENOL ) 325 MG tablet Take 1-2 tablets (325-650 mg total) by mouth every 6 (six) hours as needed for mild pain (pain score 1-3) (or temp > 100.5).     atorvastatin  (LIPITOR) 40 MG tablet Take 40 mg by mouth daily.     carboxymethylcellulose (REFRESH PLUS) 0.5 % SOLN Place 1 drop into both eyes in the morning and at bedtime.     cyanocobalamin (VITAMIN B12) 1000 MCG tablet Take 1,000 mcg by mouth daily.     metoprolol  tartrate (LOPRESSOR ) 50 MG tablet Take 1 tablet (50 mg total) by mouth 2 (two) times daily. 60 tablet 0   oxyCODONE  (OXY IR/ROXICODONE ) 5 MG immediate release tablet Take 1 tablet (5 mg total) by mouth every 4 (four) hours as needed for moderate pain (pain score 4-6) (pain score 4-6). 30 tablet 0   temazepam  (RESTORIL ) 30 MG capsule Take 30 mg by mouth at bedtime.     testosterone cypionate (DEPOTESTOSTERONE CYPIONATE) 200 MG/ML injection Inject 200 mg into the muscle every 21 ( twenty-one) days.     traMADol  (ULTRAM ) 50 MG tablet Take 1 tablet (50 mg total) by mouth every 4 (four) hours as needed for moderate pain (pain score 4-6). 30 tablet 0   triamcinolone ointment (KENALOG) 0.1 %  Apply 1 Application topically 2 (two) times daily as needed (irritation).     valsartan (DIOVAN) 160 MG tablet Take 160 mg by mouth daily.     ondansetron  (ZOFRAN ) 4 MG tablet Take 1 tablet (4 mg total) by mouth every 6 (six) hours as needed for nausea. 20 tablet 0   senna-docusate (SENOKOT-S) 8.6-50 MG tablet Take 1 tablet by mouth 2 (two) times daily. 30 tablet 0   No current facility-administered medications for this visit.    Past Medical History:  Diagnosis Date   Acute on chronic respiratory failure with hypoxia (HCC)    Aortic atherosclerosis (HCC)    Bilateral carotid artery disease (HCC)    a.) s/p RIGHT carotid endarterectomy 2011   Bursitis    Colon polyps    Coronary artery disease    a.) LHC 03/07/2020: 100% o-pRCA, 100% dRCA, 99% p-mLCx, 85% pLCx, 85% mLCx, 90% OM1, 50% pLAD, 90% D1, 85/85% mLAD --> CVTS; c.) s/p 3v CABG 03/18/2020 (LIMA-LAD, SVG-RPDA, SVG-OM1)   Heart murmur    HFrEF (heart failure with reduced ejection fraction) (HCC) 03/05/2020   a.) new onset 03/05/2020 in setting of NSTEMI   HLD (hyperlipidemia)    Hypertension    Insomnia    a.) on BZO PRN (temazepam )   Ischemic cardiomyopathy 03/05/2020   a.) TTE 03/06/2020: EF 35-45%; b.) LHC 03/07/2020: EF  35%; c.) TEE 03/18/2020: EF 43%; d.) TTE 06/09/2023: EF 45%; e.) TTE 05/08/2023: EF 50%   Leukocytosis    Long-term use of aspirin  therapy    Male hypogonadism    a.) on exogenous TST injections (depotestosterone cypionate)   NSTEMI (non-ST elevated myocardial infarction) (HCC) 03/05/2020   a.) troponins were trended: 1053 --> 1206 --> 1630 --> 1510 --> 1501 ng/L; b.) LHC 03/07/2020: 100% o-pRCA, 100% dRCA, 99% p-mLCx, 85% pLCx, 85% mLCx, 90% OM1, 50% pLAD, 90% D1, 85/85% mLAD --> CVTS; c.) s/p 3v CABG 03/18/2020 (LIMA-LAD, SVG-RPDA, SVG-OM1)   Primary osteoarthritis of right knee 2025   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   a.) 21 mm Inspiris bioprosthetic valve   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   a.)  LIMA-LAD, SVG-RPDA, SVG-OM1   Severe aortic stenosis    a.) s/p AVR (21 mm Inspiris bioprosthetic valve) 03/18/2020; performed via sternotomy approach concurrently with cardiac revascularization (CABG)   Skin cancer, basal cell    SOB (shortness of breath)    Thoracic ascending aortic aneurysm (HCC) 03/07/2020   a.) LHC 03/07/2020: Ao root 5.6 cm; b.) CTA chest 03/08/2020: 4.6 cm   Thyroid  nodule    a.) s/p partial thyroidectomy   Vasovagal syncope     Past Surgical History:  Procedure Laterality Date   AORTIC VALVE REPLACEMENT N/A 03/18/2020   Procedure: AORTIC VALVE REPLACEMENT; Location: [facility]; Surgeon: Bernardino Lares, MD   BASAL CELL CARCINOMA EXCISION Left    Cheek   CAROTID ENDARTERECTOMY Right 2011   COLONOSCOPY WITH PROPOFOL  N/A 08/20/2016   Procedure: COLONOSCOPY WITH PROPOFOL ;  Surgeon: Lamar ONEIDA Holmes, MD;  Location: [facility];  Service: Endoscopy;  Laterality: N/A;   COLONOSCOPY WITH PROPOFOL  N/A 06/17/2023   Procedure: COLONOSCOPY WITH PROPOFOL ;  Surgeon: Unk Corinn Skiff, MD;  Location: [facility] ENDOSCOPY;  Service: Gastroenterology;  Laterality: N/A;   CORONARY ARTERY BYPASS GRAFT N/A 03/18/2020   Procedure: CORONARY ARTERY BYPASS GRAFT; Location: [facility]; Surgeon: Bernardino Lares, MD   INGUINAL HERNIA REPAIR Bilateral 1970   KNEE ARTHROPLASTY Right 01/31/2024   Procedure: COMPUTER ASSISTED TOTAL KNEE ARTHROPLASTY - RNFA;  Surgeon: Mardee Lynwood SQUIBB, MD;  Location: [facility] ORS;  Service: Orthopedics;  Laterality: Right;   LEFT HEART CATH AND CORONARY ANGIOGRAPHY N/A 03/07/2020   Procedure: LEFT HEART CATH AND CORONARY ANGIOGRAPHY;  Surgeon: Hester Wolm PARAS, MD;  Location: [facility] INVASIVE CV LAB;  Service: Cardiovascular;  Laterality: N/A;   POLYPECTOMY  06/17/2023   Procedure: POLYPECTOMY;  Surgeon: Unk Corinn Skiff, MD;  Location: [facility] ENDOSCOPY;  Service: Gastroenterology;;   SHOULDER INJECTION     THYROIDECTOMY, PARTIAL       Social History   Tobacco Use   Smoking  status: Never   Smokeless tobacco: Never  Vaping Use   Vaping status: Never Used  Substance Use Topics   Alcohol  use: Yes    Comment: red wine q night   Drug use: No       Family History  Problem Relation Age of Onset   Stroke Mother    Cancer Father      Allergies  Allergen Reactions   Crestor [Rosuvastatin Calcium ] Other (See Comments)    Muscle pain     REVIEW OF SYSTEMS (Negative unless checked)  Constitutional: [] Weight loss  [] Fever  [] Chills Cardiac: [] Chest pain   [] Chest pressure   [] Palpitations   [] Shortness of breath when laying flat   [] Shortness of breath at rest   [x] Shortness of breath with exertion. Vascular:  [] Pain  in legs with walking   [] Pain in legs at rest   [] Pain in legs when laying flat   [] Claudication   [] Pain in feet when walking  [] Pain in feet at rest  [] Pain in feet when laying flat   [] History of DVT   [] Phlebitis   [] Swelling in legs   [] Varicose veins   [] Non-healing ulcers Pulmonary:   [] Uses home oxygen   [] Productive cough   [] Hemoptysis   [] Wheeze  [] COPD   [] Asthma Neurologic:  [] Dizziness  [] Blackouts   [] Seizures   [] History of stroke   [] History of TIA  [] Aphasia   [] Temporary blindness   [] Dysphagia   [] Weakness or numbness in arms   [] Weakness or numbness in legs Musculoskeletal:  [x] Arthritis   [] Joint swelling   [] Joint pain   [] Low back pain Hematologic:  [] Easy bruising  [] Easy bleeding   [] Hypercoagulable state   [] Anemic   Gastrointestinal:  [] Blood in stool   [] Vomiting blood  [] Gastroesophageal reflux/heartburn   [] Abdominal pain Genitourinary:  [] Chronic kidney disease   [] Difficult urination  [] Frequent urination  [] Burning with urination   [] Hematuria Skin:  [] Rashes   [] Ulcers   [] Wounds Psychological:  [] History of anxiety   []  History of major depression.  Physical Examination  BP (!) 150/83 (BP Location: Left Arm, Patient Position: Sitting, Cuff Size: Normal)   Pulse 68   Resp 16   Ht 5' 10 (1.778 m)   Wt 176  lb (79.8 kg)   BMI 25.25 kg/m  Gen:  WD/WN, NAD. Appears younger than stated age. Head: [place]/AT, No temporalis wasting. Ear/Nose/Throat: Hearing grossly intact, nares w/o erythema or drainage Eyes: Conjunctiva clear. Sclera non-icteric Neck: Supple.  Trachea midline Pulmonary:  Good air movement, no use of accessory muscles.  Cardiac: RRR, no JVD Vascular:  Vessel Right Left  Radial Palpable Palpable           Musculoskeletal: M/S 5/5 throughout.  No deformity or atrophy. No edema. Neurologic: Sensation grossly intact in extremities.  Symmetrical.  Speech is fluent.  Psychiatric: Judgment intact, Mood & affect appropriate for pt's clinical situation. Dermatologic: No rashes or ulcers noted.  No cellulitis or open wounds.      Labs No results found for this or any previous visit (from the past 2160 hours).  Radiology VAS US  CAROTID Result Date: 07/01/2024 Carotid Arterial Duplex Study Patient Name:  DIMITRIS SHANAHAN  Date of Exam:   06/30/2024 Medical Rec #: [id number]       Accession #:    [id number] Date of Birth: Jan 26, 1941       Patient Gender: M Patient Age:   21 years Exam Location:  [facility] Procedure:      VAS US  CAROTID Referring Phys: SELINDA GU --------------------------------------------------------------------------------  Indications:   Carotid artery disease and right endarterectomy. Risk Factors:  Hyperlipidemia, no history of smoking, prior MI. Other Factors: Right CEA 08/17/09. Performing Technologist: Donnice Charnley RVT  Examination Guidelines: A complete evaluation includes B-mode imaging, spectral Doppler, color Doppler, and power Doppler as needed of all accessible portions of each vessel. Bilateral testing is considered an integral part of a complete examination. Limited examinations for reoccurring indications may be performed as noted.  Right Carotid Findings: +----------+--------+--------+--------+------------------+--------+           PSV cm/sEDV  cm/sStenosisPlaque DescriptionComments +----------+--------+--------+--------+------------------+--------+ CCA Prox  92      13      <50%    calcific                   +----------+--------+--------+--------+------------------+--------+  CCA Mid   104     10                                         +----------+--------+--------+--------+------------------+--------+ CCA Distal86      12                                         +----------+--------+--------+--------+------------------+--------+ ICA Prox  122     15      1-39%   diffuse                    +----------+--------+--------+--------+------------------+--------+ ICA Mid   82      20                                         +----------+--------+--------+--------+------------------+--------+ ICA Distal72      18                                         +----------+--------+--------+--------+------------------+--------+ ECA       134     0                                          +----------+--------+--------+--------+------------------+--------+ +----------+--------+-------+----------------+-------------------+           PSV cm/sEDV cmsDescribe        Arm Pressure (mmHG) +----------+--------+-------+----------------+-------------------+ [id number]             Multiphasic, WNL                    +----------+--------+-------+----------------+-------------------+ +---------+--------+--+--------+--+---------+ VertebralPSV cm/s54EDV cm/s12Antegrade +---------+--------+--+--------+--+---------+  Left Carotid Findings: +----------+--------+--------+--------+--------------------------+--------+           PSV cm/sEDV cm/sStenosisPlaque Description        Comments +----------+--------+--------+--------+--------------------------+--------+ CCA Prox  118     12                                                 +----------+--------+--------+--------+--------------------------+--------+ CCA Mid    103     20                                                 +----------+--------+--------+--------+--------------------------+--------+ CCA Distal113     22                                                 +----------+--------+--------+--------+--------------------------+--------+ ICA Prox  180     44      40-59%  irregular and calcific             +----------+--------+--------+--------+--------------------------+--------+ ICA Mid   88  21                                                 +----------+--------+--------+--------+--------------------------+--------+ ICA Distal120     23                                                 +----------+--------+--------+--------+--------------------------+--------+ ECA       133     0               irregular and heterogenous         +----------+--------+--------+--------+--------------------------+--------+ +----------+--------+--------+----------------+-------------------+           PSV cm/sEDV cm/sDescribe        Arm Pressure (mmHG) +----------+--------+--------+----------------+-------------------+ Subclavian102             Multiphasic, WNL                    +----------+--------+--------+----------------+-------------------+ +---------+--------+--+--------+--+---------+ VertebralPSV cm/s46EDV cm/s10Antegrade +---------+--------+--+--------+--+---------+   Summary: Right Carotid: Velocities in the right ICA are consistent with a 1-39% stenosis.                Non-hemodynamically significant plaque <50% noted in the CCA. Left Carotid: Velocities in the left ICA are consistent with a 40-59% stenosis. Vertebrals:  Bilateral vertebral arteries demonstrate antegrade flow. Subclavians: Normal flow hemodynamics were seen in bilateral subclavian              arteries. *See table(s) above for measurements and observations.  Electronically signed by Selinda Gu MD on 07/01/2024 at 9:56:40 AM.    Final      Assessment/Plan  Carotid stenosis His carotid duplex today reveals stable 40 to 59% right ICA stenosis and a widely patent left carotid endarterectomy.  Continue 83-month interval follow-ups with duplex.  Essential hypertension blood pressure control important in reducing the progression of atherosclerotic disease. On appropriate oral medications.     HLD (hyperlipidemia) lipid control important in reducing the progression of atherosclerotic disease. Continue statin therapy  Selinda Gu, MD  07/01/2024 6:14 PM    This note was created with Dragon medical transcription system.  Any errors from dictation are purely unintentional

## 2024-07-01 NOTE — Assessment & Plan Note (Signed)
 His carotid duplex today reveals stable 40 to 59% right ICA stenosis and a widely patent left carotid endarterectomy.  Continue 25-month interval follow-ups with duplex.

## 2024-12-29 ENCOUNTER — Ambulatory Visit (INDEPENDENT_AMBULATORY_CARE_PROVIDER_SITE_OTHER): Admitting: Vascular Surgery

## 2024-12-29 ENCOUNTER — Encounter (INDEPENDENT_AMBULATORY_CARE_PROVIDER_SITE_OTHER)

## 2025-01-19 ENCOUNTER — Encounter (INDEPENDENT_AMBULATORY_CARE_PROVIDER_SITE_OTHER)

## 2025-01-19 ENCOUNTER — Ambulatory Visit (INDEPENDENT_AMBULATORY_CARE_PROVIDER_SITE_OTHER): Admitting: Vascular Surgery
